# Patient Record
Sex: Female | Born: 1975 | Race: Black or African American | Hispanic: No | Marital: Single | Smoking: Never smoker
Health system: Southern US, Community
[De-identification: ages and names within clinical notes are randomized; demographics above are authoritative.]

## PROBLEM LIST (undated history)

## (undated) DIAGNOSIS — S335XXA Sprain of ligaments of lumbar spine, initial encounter: Secondary | ICD-10-CM

## (undated) DIAGNOSIS — D571 Sickle-cell disease without crisis: Secondary | ICD-10-CM

## (undated) DIAGNOSIS — E669 Obesity, unspecified: Secondary | ICD-10-CM

## (undated) DIAGNOSIS — B009 Herpesviral infection, unspecified: Secondary | ICD-10-CM

## (undated) DIAGNOSIS — A5601 Chlamydial cystitis and urethritis: Secondary | ICD-10-CM

## (undated) DIAGNOSIS — F329 Major depressive disorder, single episode, unspecified: Secondary | ICD-10-CM

## (undated) DIAGNOSIS — R358 Other polyuria: Secondary | ICD-10-CM

## (undated) DIAGNOSIS — I10 Essential (primary) hypertension: Secondary | ICD-10-CM

## (undated) DIAGNOSIS — R51 Headache: Secondary | ICD-10-CM

## (undated) HISTORY — PX: TONSILLECTOMY AND ADENOIDECTOMY: SUR1326

## (undated) HISTORY — DX: Herpesviral infection, unspecified: B00.9

## (undated) HISTORY — PX: TUBAL LIGATION: SHX77

## (undated) HISTORY — DX: Chlamydial cystitis and urethritis: A56.01

## (undated) HISTORY — DX: Major depressive disorder, single episode, unspecified: F32.9

## (undated) HISTORY — DX: Essential (primary) hypertension: I10

## (undated) HISTORY — DX: Sprain of ligaments of lumbar spine, initial encounter: S33.5XXA

## (undated) HISTORY — DX: Sickle-cell disease without crisis: D57.1

## (undated) HISTORY — DX: Other polyuria: R35.8

## (undated) HISTORY — DX: Headache: R51

## (undated) HISTORY — DX: Obesity, unspecified: E66.9

---

## 1998-04-10 ENCOUNTER — Emergency Department (HOSPITAL_COMMUNITY): Admission: EM | Admit: 1998-04-10 | Discharge: 1998-04-10 | Payer: Self-pay | Admitting: Emergency Medicine

## 1998-04-10 ENCOUNTER — Encounter: Payer: Self-pay | Admitting: Emergency Medicine

## 1998-07-23 ENCOUNTER — Emergency Department (HOSPITAL_COMMUNITY): Admission: EM | Admit: 1998-07-23 | Discharge: 1998-07-23 | Payer: Self-pay | Admitting: Emergency Medicine

## 1998-07-25 ENCOUNTER — Emergency Department (HOSPITAL_COMMUNITY): Admission: EM | Admit: 1998-07-25 | Discharge: 1998-07-25 | Payer: Self-pay | Admitting: Emergency Medicine

## 1998-07-31 ENCOUNTER — Ambulatory Visit (HOSPITAL_COMMUNITY): Admission: RE | Admit: 1998-07-31 | Discharge: 1998-07-31 | Payer: Self-pay | Admitting: Pulmonary Disease

## 1998-07-31 ENCOUNTER — Encounter: Payer: Self-pay | Admitting: Pulmonary Disease

## 1998-10-31 ENCOUNTER — Emergency Department (HOSPITAL_COMMUNITY): Admission: EM | Admit: 1998-10-31 | Discharge: 1998-10-31 | Payer: Self-pay | Admitting: Emergency Medicine

## 1998-11-01 ENCOUNTER — Emergency Department (HOSPITAL_COMMUNITY): Admission: EM | Admit: 1998-11-01 | Discharge: 1998-11-01 | Payer: Self-pay | Admitting: Emergency Medicine

## 1998-11-02 ENCOUNTER — Emergency Department (HOSPITAL_COMMUNITY): Admission: EM | Admit: 1998-11-02 | Discharge: 1998-11-02 | Payer: Self-pay | Admitting: Emergency Medicine

## 1998-12-10 ENCOUNTER — Emergency Department (HOSPITAL_COMMUNITY): Admission: EM | Admit: 1998-12-10 | Discharge: 1998-12-10 | Payer: Self-pay | Admitting: Emergency Medicine

## 1998-12-10 ENCOUNTER — Encounter: Payer: Self-pay | Admitting: Emergency Medicine

## 1998-12-12 ENCOUNTER — Emergency Department (HOSPITAL_COMMUNITY): Admission: EM | Admit: 1998-12-12 | Discharge: 1998-12-12 | Payer: Self-pay | Admitting: Emergency Medicine

## 1999-02-05 ENCOUNTER — Emergency Department (HOSPITAL_COMMUNITY): Admission: EM | Admit: 1999-02-05 | Discharge: 1999-02-05 | Payer: Self-pay | Admitting: Emergency Medicine

## 1999-02-05 ENCOUNTER — Encounter: Payer: Self-pay | Admitting: Emergency Medicine

## 1999-06-12 ENCOUNTER — Emergency Department (HOSPITAL_COMMUNITY): Admission: EM | Admit: 1999-06-12 | Discharge: 1999-06-12 | Payer: Self-pay | Admitting: Emergency Medicine

## 1999-07-30 ENCOUNTER — Encounter: Payer: Self-pay | Admitting: Emergency Medicine

## 1999-07-30 ENCOUNTER — Emergency Department (HOSPITAL_COMMUNITY): Admission: EM | Admit: 1999-07-30 | Discharge: 1999-07-30 | Payer: Self-pay | Admitting: Emergency Medicine

## 1999-08-22 ENCOUNTER — Emergency Department (HOSPITAL_COMMUNITY): Admission: EM | Admit: 1999-08-22 | Discharge: 1999-08-22 | Payer: Self-pay | Admitting: Emergency Medicine

## 2000-01-05 ENCOUNTER — Emergency Department (HOSPITAL_COMMUNITY): Admission: EM | Admit: 2000-01-05 | Discharge: 2000-01-05 | Payer: Self-pay | Admitting: Emergency Medicine

## 2000-04-15 ENCOUNTER — Emergency Department (HOSPITAL_COMMUNITY): Admission: EM | Admit: 2000-04-15 | Discharge: 2000-04-15 | Payer: Self-pay | Admitting: Emergency Medicine

## 2000-06-18 ENCOUNTER — Emergency Department (HOSPITAL_COMMUNITY): Admission: EM | Admit: 2000-06-18 | Discharge: 2000-06-18 | Payer: Self-pay | Admitting: Emergency Medicine

## 2000-06-19 ENCOUNTER — Encounter: Payer: Self-pay | Admitting: Emergency Medicine

## 2000-06-19 ENCOUNTER — Emergency Department (HOSPITAL_COMMUNITY): Admission: EM | Admit: 2000-06-19 | Discharge: 2000-06-20 | Payer: Self-pay

## 2000-07-20 ENCOUNTER — Emergency Department (HOSPITAL_COMMUNITY): Admission: EM | Admit: 2000-07-20 | Discharge: 2000-07-20 | Payer: Self-pay

## 2000-12-03 ENCOUNTER — Emergency Department (HOSPITAL_COMMUNITY): Admission: EM | Admit: 2000-12-03 | Discharge: 2000-12-03 | Payer: Self-pay | Admitting: Emergency Medicine

## 2001-01-02 ENCOUNTER — Encounter: Admission: RE | Admit: 2001-01-02 | Discharge: 2001-01-02 | Payer: Self-pay | Admitting: Internal Medicine

## 2001-06-17 ENCOUNTER — Emergency Department (HOSPITAL_COMMUNITY): Admission: EM | Admit: 2001-06-17 | Discharge: 2001-06-17 | Payer: Self-pay | Admitting: Emergency Medicine

## 2001-06-17 ENCOUNTER — Encounter: Payer: Self-pay | Admitting: Emergency Medicine

## 2001-06-23 ENCOUNTER — Encounter: Admission: RE | Admit: 2001-06-23 | Discharge: 2001-06-23 | Payer: Self-pay | Admitting: Internal Medicine

## 2001-08-17 ENCOUNTER — Emergency Department (HOSPITAL_COMMUNITY): Admission: EM | Admit: 2001-08-17 | Discharge: 2001-08-17 | Payer: Self-pay | Admitting: Emergency Medicine

## 2001-08-20 ENCOUNTER — Encounter: Payer: Self-pay | Admitting: Emergency Medicine

## 2001-08-20 ENCOUNTER — Emergency Department (HOSPITAL_COMMUNITY): Admission: EM | Admit: 2001-08-20 | Discharge: 2001-08-20 | Payer: Self-pay | Admitting: Emergency Medicine

## 2001-08-21 ENCOUNTER — Emergency Department (HOSPITAL_COMMUNITY): Admission: EM | Admit: 2001-08-21 | Discharge: 2001-08-21 | Payer: Self-pay | Admitting: Emergency Medicine

## 2001-08-21 ENCOUNTER — Encounter: Payer: Self-pay | Admitting: Emergency Medicine

## 2001-08-26 ENCOUNTER — Encounter: Admission: RE | Admit: 2001-08-26 | Discharge: 2001-08-26 | Payer: Self-pay

## 2001-08-26 ENCOUNTER — Ambulatory Visit (HOSPITAL_COMMUNITY): Admission: RE | Admit: 2001-08-26 | Discharge: 2001-08-26 | Payer: Self-pay

## 2001-08-26 ENCOUNTER — Encounter: Payer: Self-pay | Admitting: Internal Medicine

## 2001-09-02 ENCOUNTER — Encounter: Admission: RE | Admit: 2001-09-02 | Discharge: 2001-09-02 | Payer: Self-pay | Admitting: Internal Medicine

## 2001-09-16 ENCOUNTER — Encounter: Admission: RE | Admit: 2001-09-16 | Discharge: 2001-09-16 | Payer: Self-pay

## 2001-12-01 ENCOUNTER — Emergency Department (HOSPITAL_COMMUNITY): Admission: EM | Admit: 2001-12-01 | Discharge: 2001-12-01 | Payer: Self-pay | Admitting: *Deleted

## 2002-01-19 ENCOUNTER — Emergency Department (HOSPITAL_COMMUNITY): Admission: EM | Admit: 2002-01-19 | Discharge: 2002-01-19 | Payer: Self-pay | Admitting: Emergency Medicine

## 2002-02-10 ENCOUNTER — Emergency Department (HOSPITAL_COMMUNITY): Admission: EM | Admit: 2002-02-10 | Discharge: 2002-02-10 | Payer: Self-pay

## 2002-07-14 ENCOUNTER — Inpatient Hospital Stay (HOSPITAL_COMMUNITY): Admission: AD | Admit: 2002-07-14 | Discharge: 2002-07-14 | Payer: Self-pay | Admitting: Obstetrics and Gynecology

## 2002-07-15 ENCOUNTER — Inpatient Hospital Stay (HOSPITAL_COMMUNITY): Admission: AD | Admit: 2002-07-15 | Discharge: 2002-07-15 | Payer: Self-pay | Admitting: Internal Medicine

## 2002-07-16 ENCOUNTER — Inpatient Hospital Stay (HOSPITAL_COMMUNITY): Admission: AD | Admit: 2002-07-16 | Discharge: 2002-07-16 | Payer: Self-pay | Admitting: *Deleted

## 2002-07-27 ENCOUNTER — Encounter: Admission: RE | Admit: 2002-07-27 | Discharge: 2002-07-27 | Payer: Self-pay | Admitting: Obstetrics and Gynecology

## 2002-10-08 ENCOUNTER — Emergency Department (HOSPITAL_COMMUNITY): Admission: EM | Admit: 2002-10-08 | Discharge: 2002-10-08 | Payer: Self-pay | Admitting: Emergency Medicine

## 2002-11-25 ENCOUNTER — Inpatient Hospital Stay (HOSPITAL_COMMUNITY): Admission: AD | Admit: 2002-11-25 | Discharge: 2002-11-25 | Payer: Self-pay | Admitting: *Deleted

## 2002-11-25 ENCOUNTER — Encounter: Payer: Self-pay | Admitting: *Deleted

## 2002-12-16 ENCOUNTER — Encounter: Payer: Self-pay | Admitting: *Deleted

## 2002-12-16 ENCOUNTER — Ambulatory Visit (HOSPITAL_COMMUNITY): Admission: RE | Admit: 2002-12-16 | Discharge: 2002-12-16 | Payer: Self-pay | Admitting: *Deleted

## 2003-01-04 ENCOUNTER — Inpatient Hospital Stay: Admission: AD | Admit: 2003-01-04 | Discharge: 2003-01-04 | Payer: Self-pay | Admitting: *Deleted

## 2003-01-14 ENCOUNTER — Ambulatory Visit (HOSPITAL_COMMUNITY): Admission: RE | Admit: 2003-01-14 | Discharge: 2003-01-14 | Payer: Self-pay | Admitting: *Deleted

## 2003-01-20 ENCOUNTER — Encounter: Admission: RE | Admit: 2003-01-20 | Discharge: 2003-01-20 | Payer: Self-pay | Admitting: Family Medicine

## 2003-01-27 ENCOUNTER — Encounter: Admission: RE | Admit: 2003-01-27 | Discharge: 2003-01-27 | Payer: Self-pay | Admitting: Family Medicine

## 2003-02-03 ENCOUNTER — Encounter: Admission: RE | Admit: 2003-02-03 | Discharge: 2003-02-03 | Payer: Self-pay | Admitting: *Deleted

## 2003-02-17 ENCOUNTER — Encounter: Admission: RE | Admit: 2003-02-17 | Discharge: 2003-02-17 | Payer: Self-pay | Admitting: *Deleted

## 2003-02-21 ENCOUNTER — Inpatient Hospital Stay (HOSPITAL_COMMUNITY): Admission: AD | Admit: 2003-02-21 | Discharge: 2003-02-21 | Payer: Self-pay | Admitting: Family Medicine

## 2003-03-03 ENCOUNTER — Encounter: Admission: RE | Admit: 2003-03-03 | Discharge: 2003-03-03 | Payer: Self-pay | Admitting: *Deleted

## 2003-03-17 ENCOUNTER — Encounter: Admission: RE | Admit: 2003-03-17 | Discharge: 2003-03-17 | Payer: Self-pay | Admitting: *Deleted

## 2003-03-30 ENCOUNTER — Inpatient Hospital Stay (HOSPITAL_COMMUNITY): Admission: AD | Admit: 2003-03-30 | Discharge: 2003-03-30 | Payer: Self-pay | Admitting: Obstetrics & Gynecology

## 2003-03-31 ENCOUNTER — Ambulatory Visit (HOSPITAL_COMMUNITY): Admission: RE | Admit: 2003-03-31 | Discharge: 2003-03-31 | Payer: Self-pay | Admitting: Family Medicine

## 2003-03-31 ENCOUNTER — Encounter: Admission: RE | Admit: 2003-03-31 | Discharge: 2003-03-31 | Payer: Self-pay | Admitting: *Deleted

## 2003-04-05 ENCOUNTER — Encounter: Admission: RE | Admit: 2003-04-05 | Discharge: 2003-07-04 | Payer: Self-pay | Admitting: Obstetrics and Gynecology

## 2003-04-07 ENCOUNTER — Encounter: Admission: RE | Admit: 2003-04-07 | Discharge: 2003-04-07 | Payer: Self-pay | Admitting: *Deleted

## 2003-04-13 ENCOUNTER — Encounter: Admission: RE | Admit: 2003-04-13 | Discharge: 2003-04-13 | Payer: Self-pay | Admitting: *Deleted

## 2003-04-18 ENCOUNTER — Inpatient Hospital Stay (HOSPITAL_COMMUNITY): Admission: AD | Admit: 2003-04-18 | Discharge: 2003-04-18 | Payer: Self-pay | Admitting: Obstetrics and Gynecology

## 2003-04-21 ENCOUNTER — Inpatient Hospital Stay (HOSPITAL_COMMUNITY): Admission: AD | Admit: 2003-04-21 | Discharge: 2003-04-21 | Payer: Self-pay | Admitting: Obstetrics & Gynecology

## 2003-04-21 ENCOUNTER — Encounter: Admission: RE | Admit: 2003-04-21 | Discharge: 2003-04-21 | Payer: Self-pay | Admitting: *Deleted

## 2003-04-28 ENCOUNTER — Encounter: Admission: RE | Admit: 2003-04-28 | Discharge: 2003-04-28 | Payer: Self-pay | Admitting: *Deleted

## 2003-05-01 ENCOUNTER — Inpatient Hospital Stay (HOSPITAL_COMMUNITY): Admission: AD | Admit: 2003-05-01 | Discharge: 2003-05-01 | Payer: Self-pay | Admitting: Family Medicine

## 2003-05-05 ENCOUNTER — Encounter: Admission: RE | Admit: 2003-05-05 | Discharge: 2003-05-05 | Payer: Self-pay | Admitting: *Deleted

## 2003-05-09 ENCOUNTER — Ambulatory Visit (HOSPITAL_COMMUNITY): Admission: RE | Admit: 2003-05-09 | Discharge: 2003-05-09 | Payer: Self-pay | Admitting: Obstetrics and Gynecology

## 2003-05-09 ENCOUNTER — Encounter: Admission: RE | Admit: 2003-05-09 | Discharge: 2003-05-09 | Payer: Self-pay | Admitting: *Deleted

## 2003-05-12 ENCOUNTER — Encounter: Admission: RE | Admit: 2003-05-12 | Discharge: 2003-05-12 | Payer: Self-pay | Admitting: *Deleted

## 2003-05-16 ENCOUNTER — Inpatient Hospital Stay (HOSPITAL_COMMUNITY): Admission: AD | Admit: 2003-05-16 | Discharge: 2003-05-16 | Payer: Self-pay | Admitting: Obstetrics & Gynecology

## 2003-05-16 ENCOUNTER — Encounter: Admission: RE | Admit: 2003-05-16 | Discharge: 2003-05-16 | Payer: Self-pay | Admitting: *Deleted

## 2003-05-19 ENCOUNTER — Encounter: Admission: RE | Admit: 2003-05-19 | Discharge: 2003-05-19 | Payer: Self-pay | Admitting: Family Medicine

## 2003-05-22 ENCOUNTER — Inpatient Hospital Stay (HOSPITAL_COMMUNITY): Admission: AD | Admit: 2003-05-22 | Discharge: 2003-05-22 | Payer: Self-pay | Admitting: Obstetrics and Gynecology

## 2003-05-23 ENCOUNTER — Encounter: Admission: RE | Admit: 2003-05-23 | Discharge: 2003-05-23 | Payer: Self-pay | Admitting: *Deleted

## 2003-05-24 ENCOUNTER — Inpatient Hospital Stay (HOSPITAL_COMMUNITY): Admission: AD | Admit: 2003-05-24 | Discharge: 2003-05-25 | Payer: Self-pay | Admitting: *Deleted

## 2003-05-26 ENCOUNTER — Encounter: Admission: RE | Admit: 2003-05-26 | Discharge: 2003-05-26 | Payer: Self-pay | Admitting: *Deleted

## 2003-05-30 ENCOUNTER — Encounter: Admission: RE | Admit: 2003-05-30 | Discharge: 2003-05-30 | Payer: Self-pay | Admitting: *Deleted

## 2003-06-02 ENCOUNTER — Encounter: Admission: RE | Admit: 2003-06-02 | Discharge: 2003-06-02 | Payer: Self-pay | Admitting: *Deleted

## 2003-06-06 ENCOUNTER — Encounter: Admission: RE | Admit: 2003-06-06 | Discharge: 2003-06-06 | Payer: Self-pay | Admitting: *Deleted

## 2003-06-07 ENCOUNTER — Encounter (INDEPENDENT_AMBULATORY_CARE_PROVIDER_SITE_OTHER): Payer: Self-pay | Admitting: Specialist

## 2003-06-07 ENCOUNTER — Other Ambulatory Visit: Payer: Self-pay | Admitting: Student

## 2003-06-07 ENCOUNTER — Inpatient Hospital Stay (HOSPITAL_COMMUNITY): Admission: AD | Admit: 2003-06-07 | Discharge: 2003-06-10 | Payer: Self-pay | Admitting: Obstetrics & Gynecology

## 2003-06-16 ENCOUNTER — Encounter: Admission: RE | Admit: 2003-06-16 | Discharge: 2003-06-16 | Payer: Self-pay | Admitting: *Deleted

## 2004-05-24 ENCOUNTER — Emergency Department (HOSPITAL_COMMUNITY): Admission: EM | Admit: 2004-05-24 | Discharge: 2004-05-24 | Payer: Self-pay | Admitting: Family Medicine

## 2005-01-09 ENCOUNTER — Emergency Department (HOSPITAL_COMMUNITY): Admission: EM | Admit: 2005-01-09 | Discharge: 2005-01-09 | Payer: Self-pay | Admitting: Emergency Medicine

## 2005-02-13 ENCOUNTER — Emergency Department (HOSPITAL_COMMUNITY): Admission: EM | Admit: 2005-02-13 | Discharge: 2005-02-13 | Payer: Self-pay | Admitting: Emergency Medicine

## 2005-07-10 ENCOUNTER — Emergency Department (HOSPITAL_COMMUNITY): Admission: EM | Admit: 2005-07-10 | Discharge: 2005-07-10 | Payer: Self-pay | Admitting: Emergency Medicine

## 2005-08-22 IMAGING — US US OB FOLLOW-UP
1 series · 13 of 28 positions shown · non-contrast
Comparison: none

CLINICAL DATA: Assess growth and amniotic fluid volume.  Patient has gestational diabetes and hypertension.

[Series 1: unknown · 0.37mm/px · 13 of 35 slices shown]
[im 2/35]
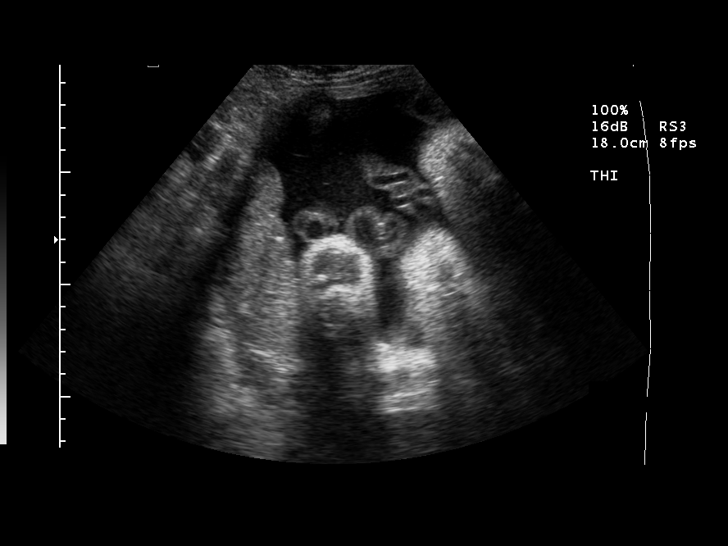
[im 4/35]
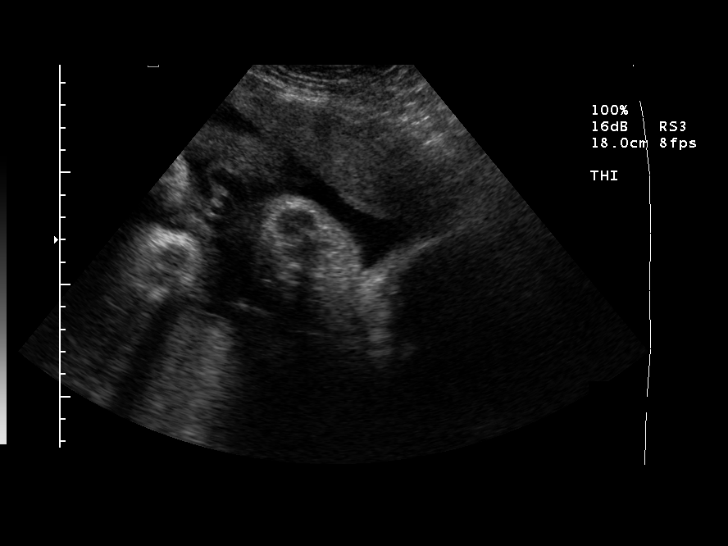
[im 7/35]
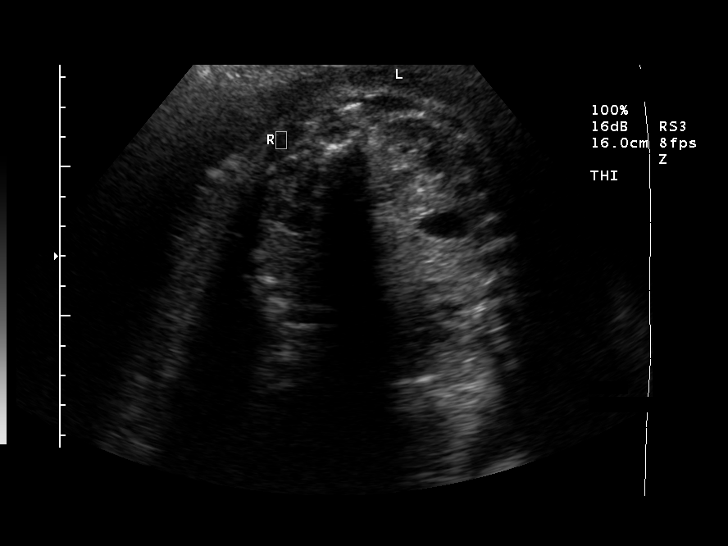
[im 9/35]
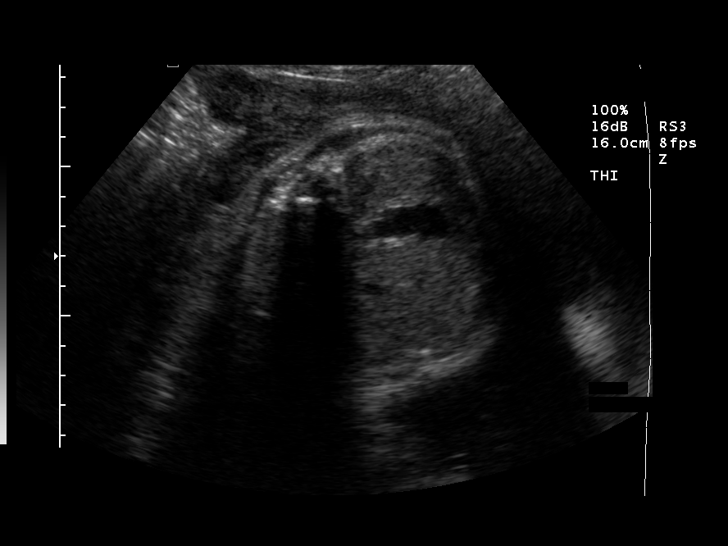
[im 12/35]
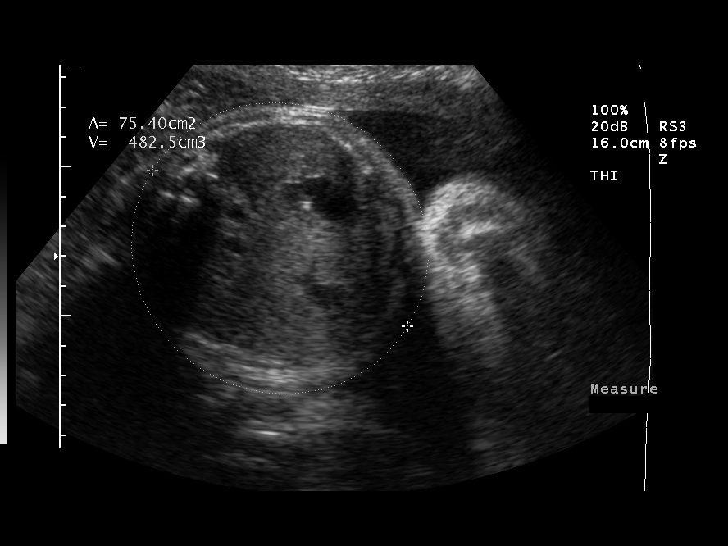
[im 14/35]
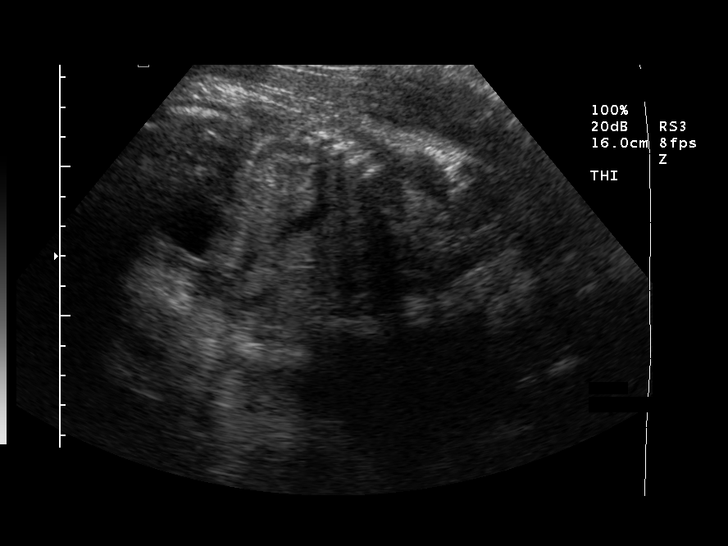
[im 18/35]
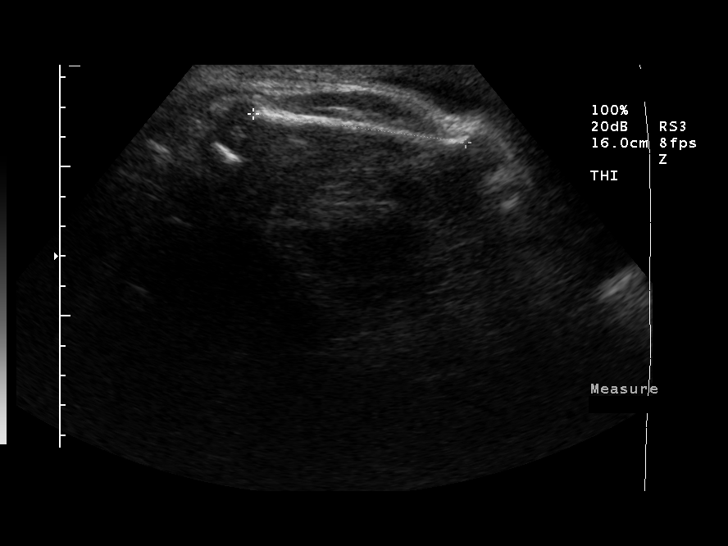
[im 21/35]
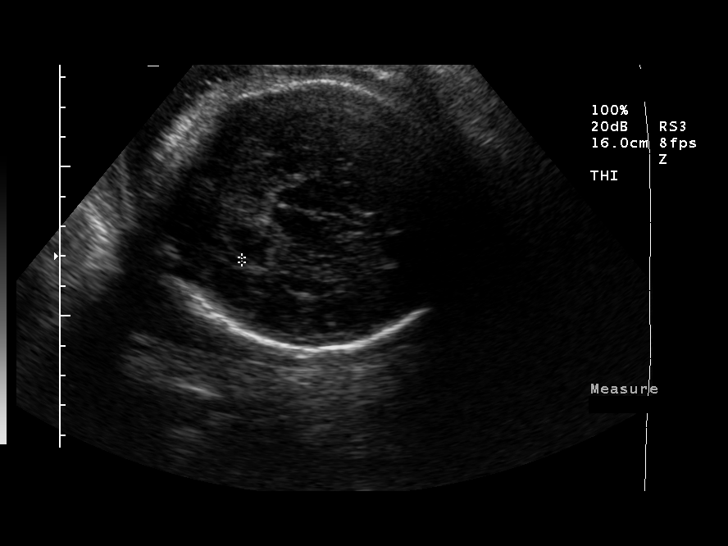
[im 23/35]
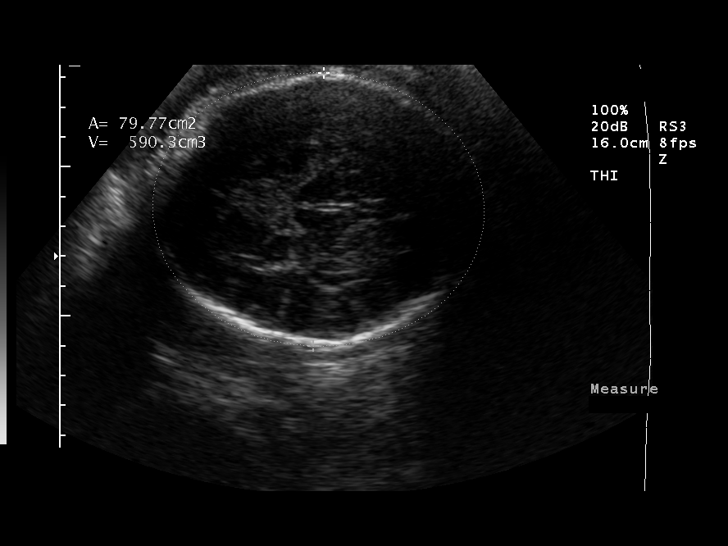
[im 26/35]
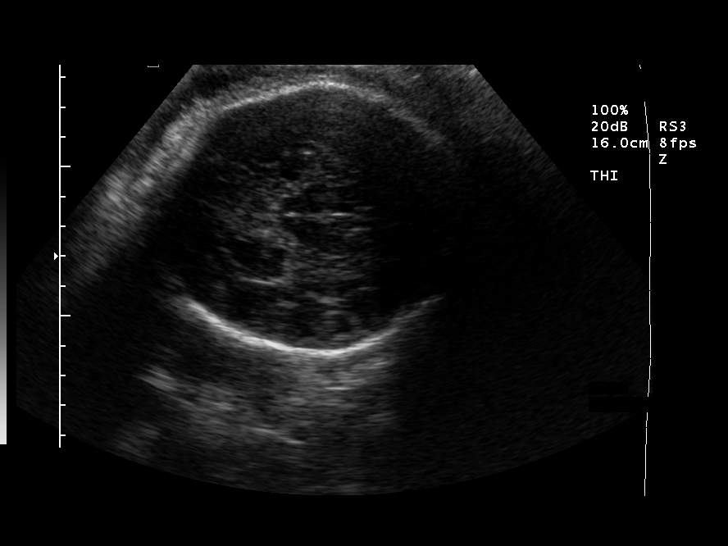
[im 28/35]
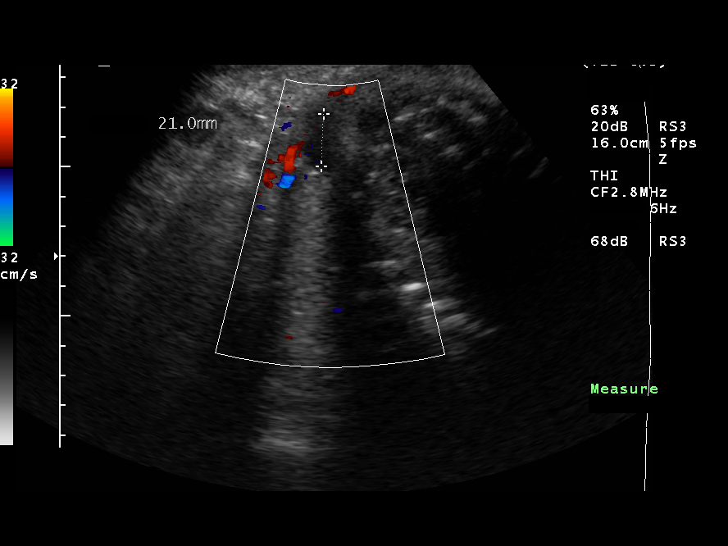
[im 31/35]
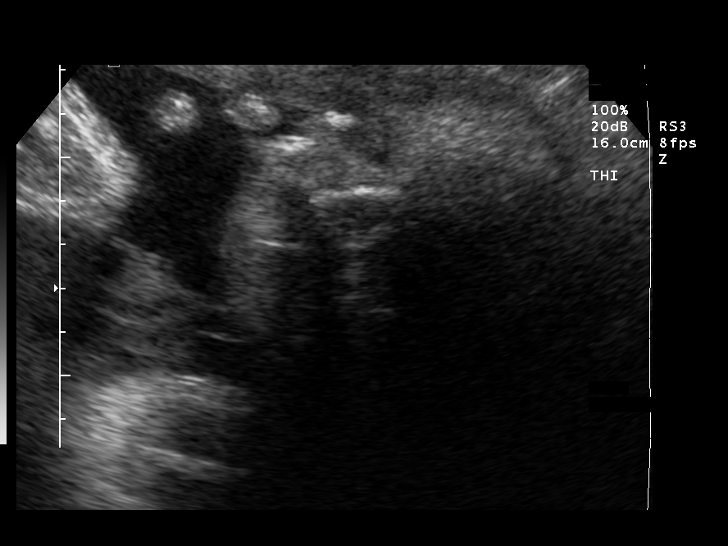
[im 33/35]
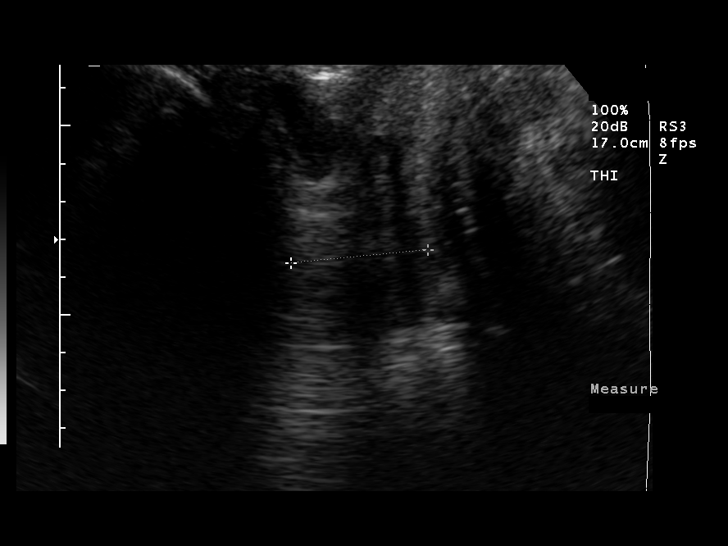

[13 of 28 positions shown; findings below may reference images not displayed]

OBSTETRICAL ULTRASOUND RE-EVALUATION

 NUMBER OF FETUSES: 1
 HEART RATE:  135
 MOVEMENT:  Yes
 BREATHING:  No
 PRESENTATION:  Cephalic
 PLACENTAL LOCATION:  Posterior
 GRADE:  I
 PREVIA:  No
 AMNIOTIC FLUID (subjective):  Normal
 AMNIOTIC FLUID (objective):  13.5 cm AFI (5th - 95th%ile =   7.9 – 24.9 cm for 35 wks)

 FETAL BIOMETRY
 BPD:  9.0 cm   36 w 3 d
 HC:  31.9 cm  35 w 6 d
 AC:  30.7 cm  34 w 5 d
 FL:  7.0 cm  36 w 0 d
 MEAN GA:  35 w 5 d
 ASSIGNED GA:  34 w 4 d (1st US)
 EFW:  9294 g (H) 50th – 75th%ile (9606 – 5811 g) For 35 weeks

 FETAL ANATOMY
 LATERAL VENTRICLES:  Visualized 
 THALAMI/CSP:  Visualized 
 POSTERIOR FOSSA:  Previously seen 
 NUCHAL REGION:  Previously seen 
 SPINE:  Previously seen 
 4 CHAMBER HEART ON LEFT:  Previously seen 
 STOMACH ON LEFT:  Visualized 
 3 VESSEL CORD:  Visualized 
 CORD INSERTION SITE:  Previously seen 
 KIDNEYS:  Visualized 
 BLADDER:  Visualized 
 EXTREMITIES:  Previously seen 

 MATERNAL FINDINGS
 CERVIX:  3.6 cm Translabially
IMPRESSION: Single living intrauterine fetus in cephalic presentation.  Patient is 34 weeks 4 days by first ultrasound and measures 35 weeks 5 days today with a fetal weight of 9294 grams, falling between the 50th and 75th percentile for 35 weeks.  Growth is appropriate.
 Normal amniotic fluid volume with AFI 13.5 cm.

## 2005-12-23 ENCOUNTER — Emergency Department (HOSPITAL_COMMUNITY): Admission: EM | Admit: 2005-12-23 | Discharge: 2005-12-23 | Payer: Self-pay | Admitting: Emergency Medicine

## 2005-12-31 ENCOUNTER — Emergency Department (HOSPITAL_COMMUNITY): Admission: EM | Admit: 2005-12-31 | Discharge: 2005-12-31 | Payer: Self-pay | Admitting: *Deleted

## 2006-07-07 ENCOUNTER — Emergency Department (HOSPITAL_COMMUNITY): Admission: EM | Admit: 2006-07-07 | Discharge: 2006-07-07 | Payer: Self-pay | Admitting: Family Medicine

## 2006-08-11 ENCOUNTER — Emergency Department (HOSPITAL_COMMUNITY): Admission: EM | Admit: 2006-08-11 | Discharge: 2006-08-11 | Payer: Self-pay | Admitting: Family Medicine

## 2006-08-14 ENCOUNTER — Emergency Department (HOSPITAL_COMMUNITY): Admission: EM | Admit: 2006-08-14 | Discharge: 2006-08-14 | Payer: Self-pay | Admitting: Family Medicine

## 2006-09-16 ENCOUNTER — Emergency Department (HOSPITAL_COMMUNITY): Admission: EM | Admit: 2006-09-16 | Discharge: 2006-09-16 | Payer: Self-pay | Admitting: Emergency Medicine

## 2006-12-16 ENCOUNTER — Emergency Department (HOSPITAL_COMMUNITY): Admission: EM | Admit: 2006-12-16 | Discharge: 2006-12-16 | Payer: Self-pay | Admitting: *Deleted

## 2007-02-17 ENCOUNTER — Emergency Department (HOSPITAL_COMMUNITY): Admission: EM | Admit: 2007-02-17 | Discharge: 2007-02-17 | Payer: Self-pay | Admitting: Emergency Medicine

## 2007-06-22 ENCOUNTER — Emergency Department (HOSPITAL_COMMUNITY): Admission: EM | Admit: 2007-06-22 | Discharge: 2007-06-22 | Payer: Self-pay | Admitting: Emergency Medicine

## 2007-08-11 ENCOUNTER — Emergency Department (HOSPITAL_COMMUNITY): Admission: EM | Admit: 2007-08-11 | Discharge: 2007-08-11 | Payer: Self-pay | Admitting: Emergency Medicine

## 2007-08-24 ENCOUNTER — Emergency Department (HOSPITAL_COMMUNITY): Admission: EM | Admit: 2007-08-24 | Discharge: 2007-08-24 | Payer: Self-pay | Admitting: Family Medicine

## 2007-11-25 ENCOUNTER — Emergency Department (HOSPITAL_COMMUNITY): Admission: EM | Admit: 2007-11-25 | Discharge: 2007-11-25 | Payer: Self-pay | Admitting: Emergency Medicine

## 2007-12-06 ENCOUNTER — Emergency Department (HOSPITAL_COMMUNITY): Admission: EM | Admit: 2007-12-06 | Discharge: 2007-12-06 | Payer: Self-pay | Admitting: Emergency Medicine

## 2008-02-15 ENCOUNTER — Emergency Department (HOSPITAL_COMMUNITY): Admission: EM | Admit: 2008-02-15 | Discharge: 2008-02-15 | Payer: Self-pay | Admitting: Family Medicine

## 2008-03-31 ENCOUNTER — Emergency Department (HOSPITAL_COMMUNITY): Admission: EM | Admit: 2008-03-31 | Discharge: 2008-03-31 | Payer: Self-pay | Admitting: Emergency Medicine

## 2008-04-22 ENCOUNTER — Emergency Department (HOSPITAL_COMMUNITY): Admission: EM | Admit: 2008-04-22 | Discharge: 2008-04-22 | Payer: Self-pay | Admitting: Family Medicine

## 2008-06-06 ENCOUNTER — Emergency Department (HOSPITAL_COMMUNITY): Admission: EM | Admit: 2008-06-06 | Discharge: 2008-06-07 | Payer: Self-pay | Admitting: Emergency Medicine

## 2008-08-16 ENCOUNTER — Emergency Department (HOSPITAL_COMMUNITY): Admission: EM | Admit: 2008-08-16 | Discharge: 2008-08-16 | Payer: Self-pay | Admitting: Family Medicine

## 2008-10-20 ENCOUNTER — Ambulatory Visit: Payer: Self-pay | Admitting: Family Medicine

## 2008-10-20 DIAGNOSIS — F3289 Other specified depressive episodes: Secondary | ICD-10-CM | POA: Insufficient documentation

## 2008-10-20 DIAGNOSIS — F329 Major depressive disorder, single episode, unspecified: Secondary | ICD-10-CM | POA: Insufficient documentation

## 2008-10-20 DIAGNOSIS — I10 Essential (primary) hypertension: Secondary | ICD-10-CM

## 2008-10-20 HISTORY — DX: Major depressive disorder, single episode, unspecified: F32.9

## 2008-10-20 HISTORY — DX: Essential (primary) hypertension: I10

## 2008-10-20 HISTORY — DX: Other specified depressive episodes: F32.89

## 2008-11-23 ENCOUNTER — Other Ambulatory Visit: Admission: RE | Admit: 2008-11-23 | Discharge: 2008-11-23 | Payer: Self-pay | Admitting: Family Medicine

## 2008-11-23 ENCOUNTER — Encounter: Payer: Self-pay | Admitting: Family Medicine

## 2008-11-23 ENCOUNTER — Ambulatory Visit: Payer: Self-pay | Admitting: Family Medicine

## 2008-11-23 DIAGNOSIS — R21 Rash and other nonspecific skin eruption: Secondary | ICD-10-CM

## 2008-11-23 LAB — CONVERTED CEMR LAB
Bilirubin Urine: NEGATIVE
Glucose, Urine, Semiquant: NEGATIVE
Ketones, urine, test strip: NEGATIVE
Nitrite: NEGATIVE
Specific Gravity, Urine: 1.02
Urobilinogen, UA: 0.2
pH: 6

## 2008-11-24 LAB — CONVERTED CEMR LAB
ALT: 13 units/L (ref 0–35)
AST: 21 units/L (ref 0–37)
Albumin: 4.1 g/dL (ref 3.5–5.2)
Alkaline Phosphatase: 56 units/L (ref 39–117)
BUN: 11 mg/dL (ref 6–23)
Basophils Absolute: 0 10*3/uL (ref 0.0–0.1)
Basophils Relative: 0.2 % (ref 0.0–3.0)
Bilirubin, Direct: 0 mg/dL (ref 0.0–0.3)
CO2: 32 meq/L (ref 19–32)
Calcium: 9.7 mg/dL (ref 8.4–10.5)
Chloride: 102 meq/L (ref 96–112)
Cholesterol: 192 mg/dL (ref 0–200)
Creatinine, Ser: 0.8 mg/dL (ref 0.4–1.2)
Eosinophils Absolute: 0.1 10*3/uL (ref 0.0–0.7)
Eosinophils Relative: 2.8 % (ref 0.0–5.0)
GFR calc non Af Amer: 107.18 mL/min (ref >60)
Glucose, Bld: 90 mg/dL (ref 70–99)
HCT: 36.6 % (ref 36.0–46.0)
HDL: 46.2 mg/dL (ref >39.00)
Hemoglobin: 12.6 g/dL (ref 12.0–15.0)
LDL Cholesterol: 132 mg/dL — ABNORMAL HIGH (ref 0–99)
Lymphocytes Relative: 37.5 % (ref 12.0–46.0)
Lymphs Abs: 1.8 10*3/uL (ref 0.7–4.0)
MCHC: 34.4 g/dL (ref 30.0–36.0)
MCV: 86 fL (ref 78.0–100.0)
Monocytes Absolute: 0.4 10*3/uL (ref 0.1–1.0)
Monocytes Relative: 8.3 % (ref 3.0–12.0)
Neutro Abs: 2.5 10*3/uL (ref 1.4–7.7)
Neutrophils Relative %: 51.2 % (ref 43.0–77.0)
Platelets: 331 10*3/uL (ref 150.0–400.0)
Potassium: 3.6 meq/L (ref 3.5–5.1)
RBC: 4.25 M/uL (ref 3.87–5.11)
RDW: 12.9 % (ref 11.5–14.6)
Sodium: 142 meq/L (ref 135–145)
TSH: 0.71 microintl units/mL (ref 0.35–5.50)
Total Bilirubin: 0.8 mg/dL (ref 0.3–1.2)
Total CHOL/HDL Ratio: 4
Total Protein: 7.9 g/dL (ref 6.0–8.3)
Triglycerides: 68 mg/dL (ref 0.0–149.0)
VLDL: 13.6 mg/dL (ref 0.0–40.0)
WBC: 4.8 10*3/uL (ref 4.5–10.5)

## 2008-11-24 IMAGING — CR DG HAND COMPLETE 3+V*R*
3 series · 3 of 3 positions shown · non-contrast
Comparison: none

CLINICAL DATA: Fall on outstretched hand yesterday.  
RIGHT HAND ? 3 VIEW:
  Pain and swelling to second MCP joint.       FINDINGS:  There is no evidence of bone, joint, or soft tissue abnormality.

[view not recorded (1 of 3)]
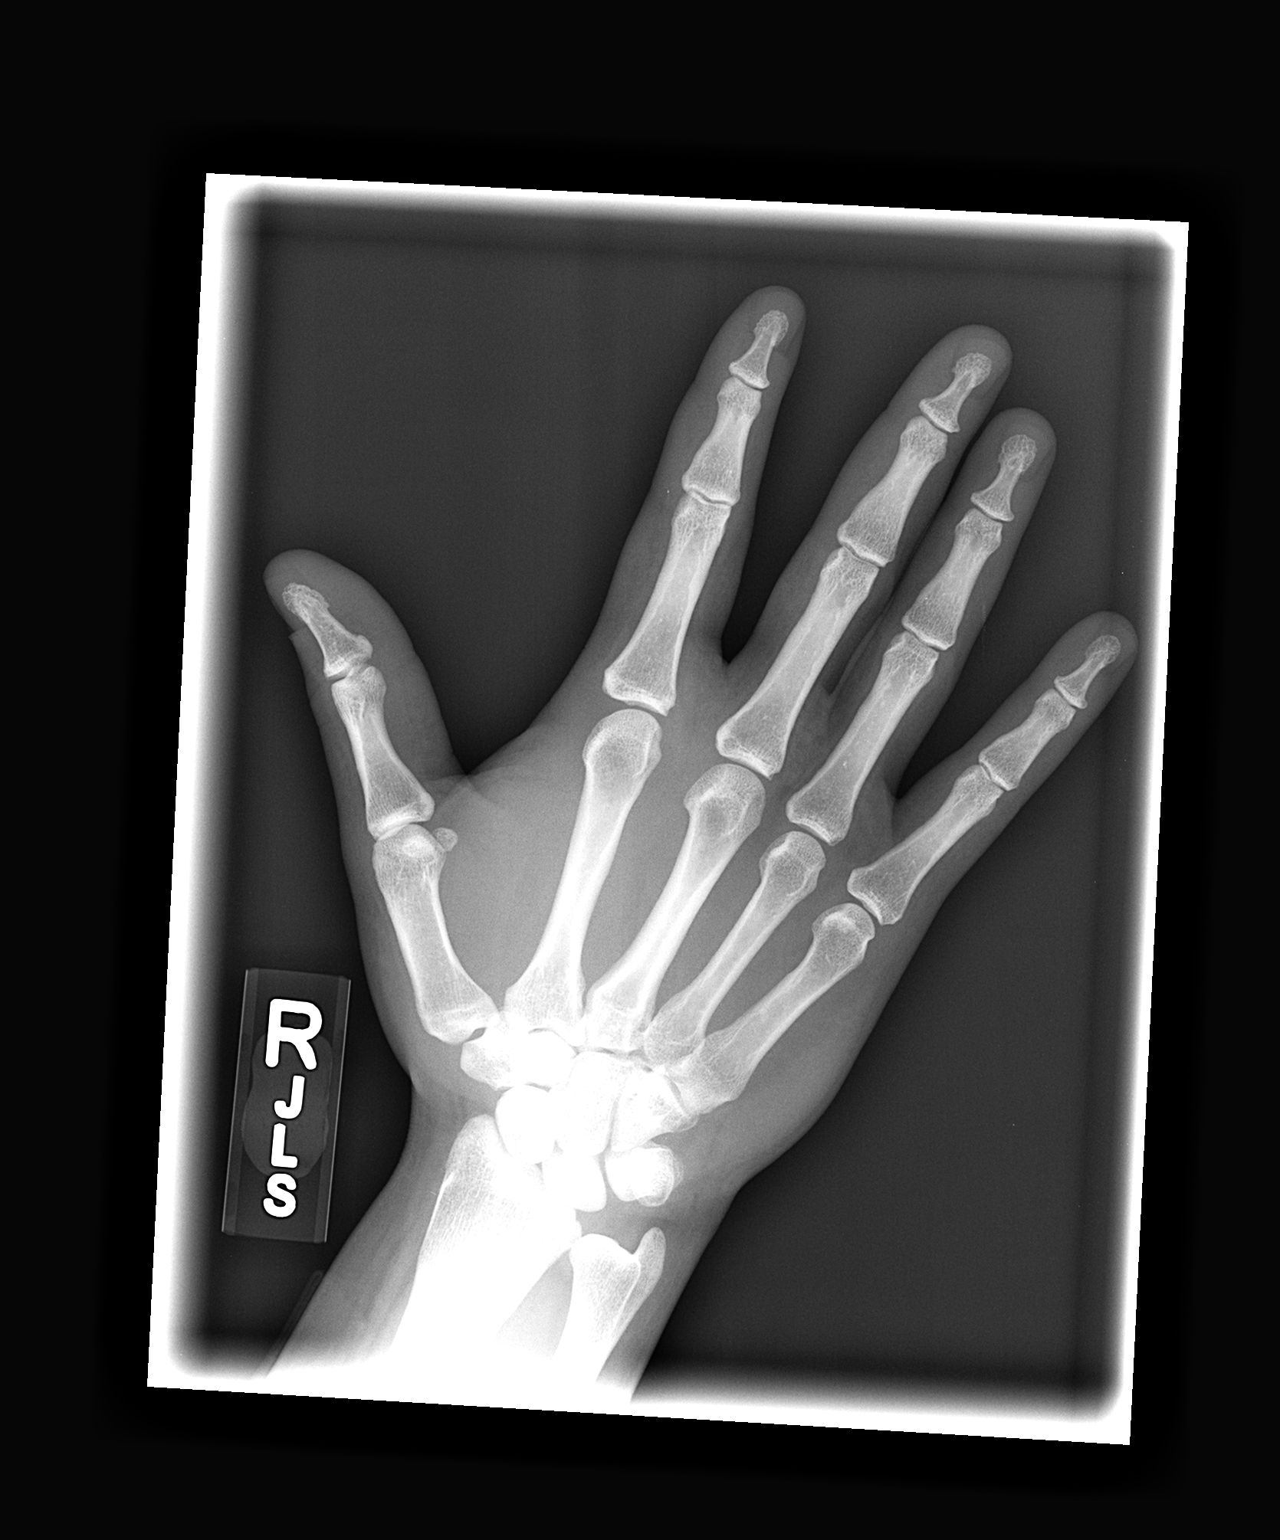

[view not recorded (2 of 3)]
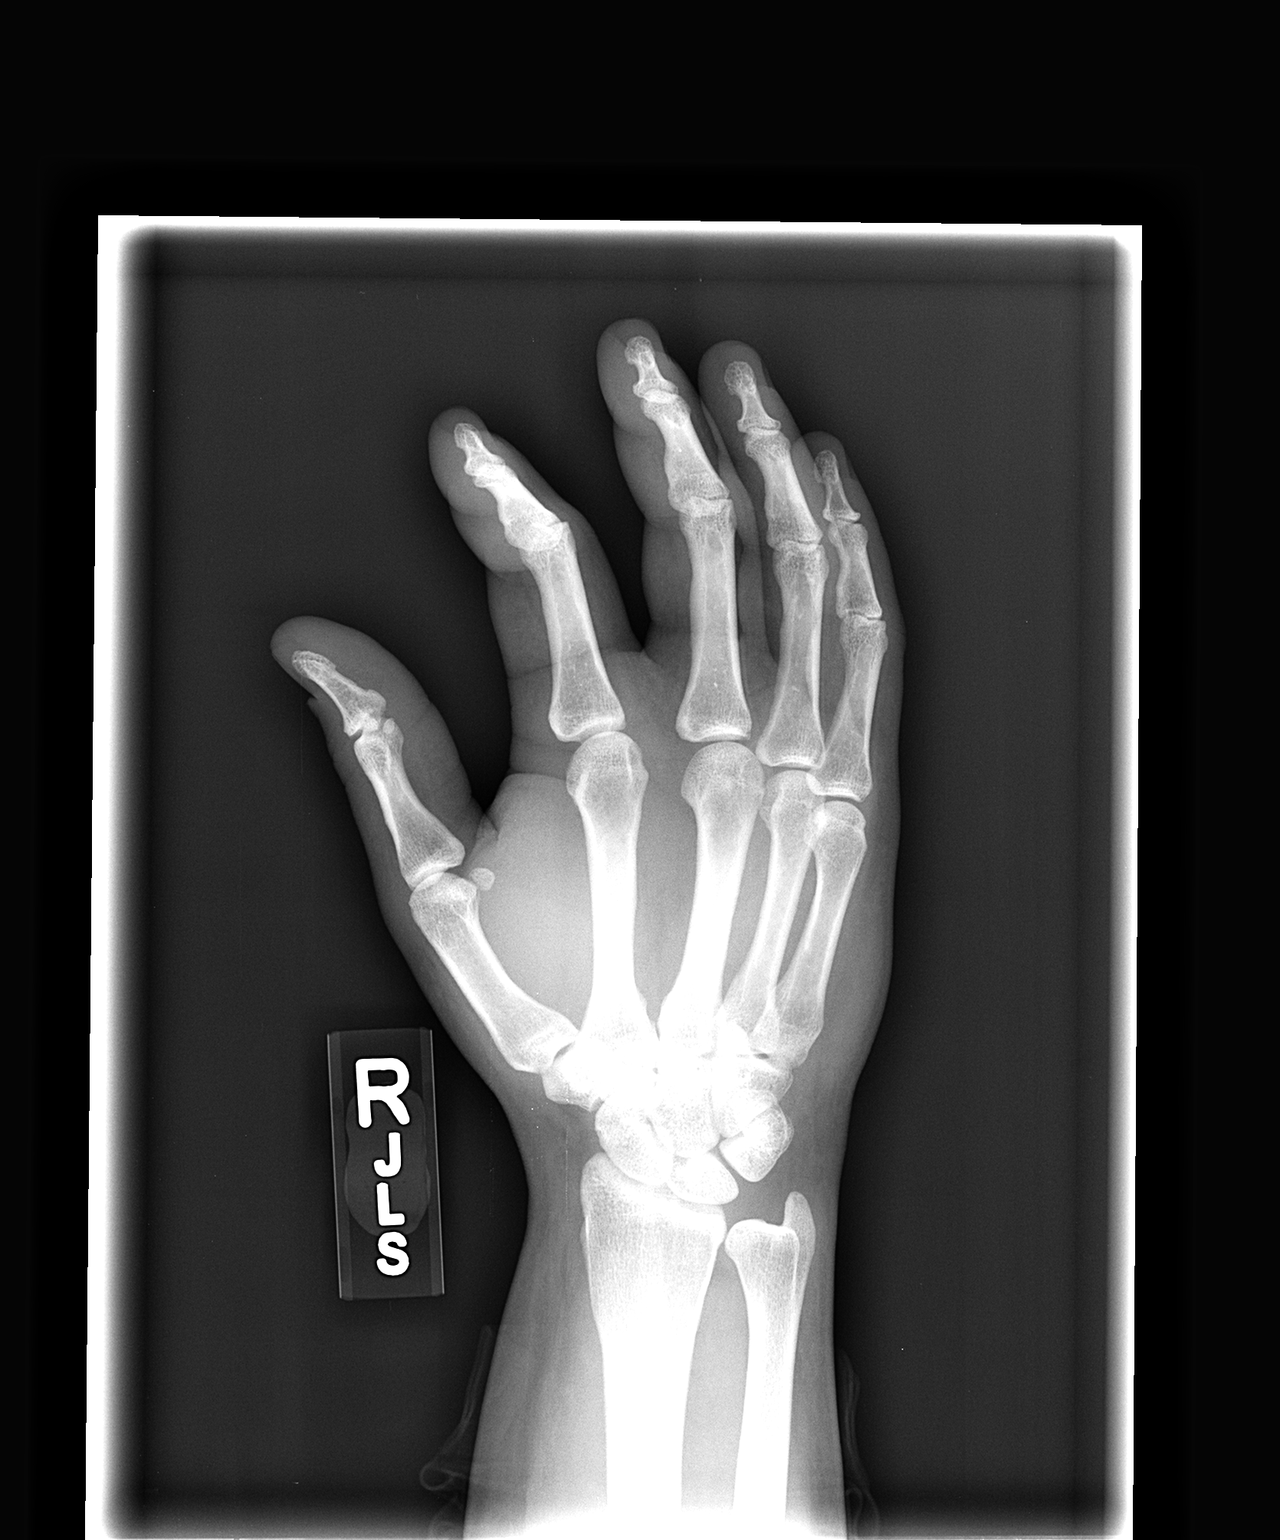

[view not recorded (3 of 3)]
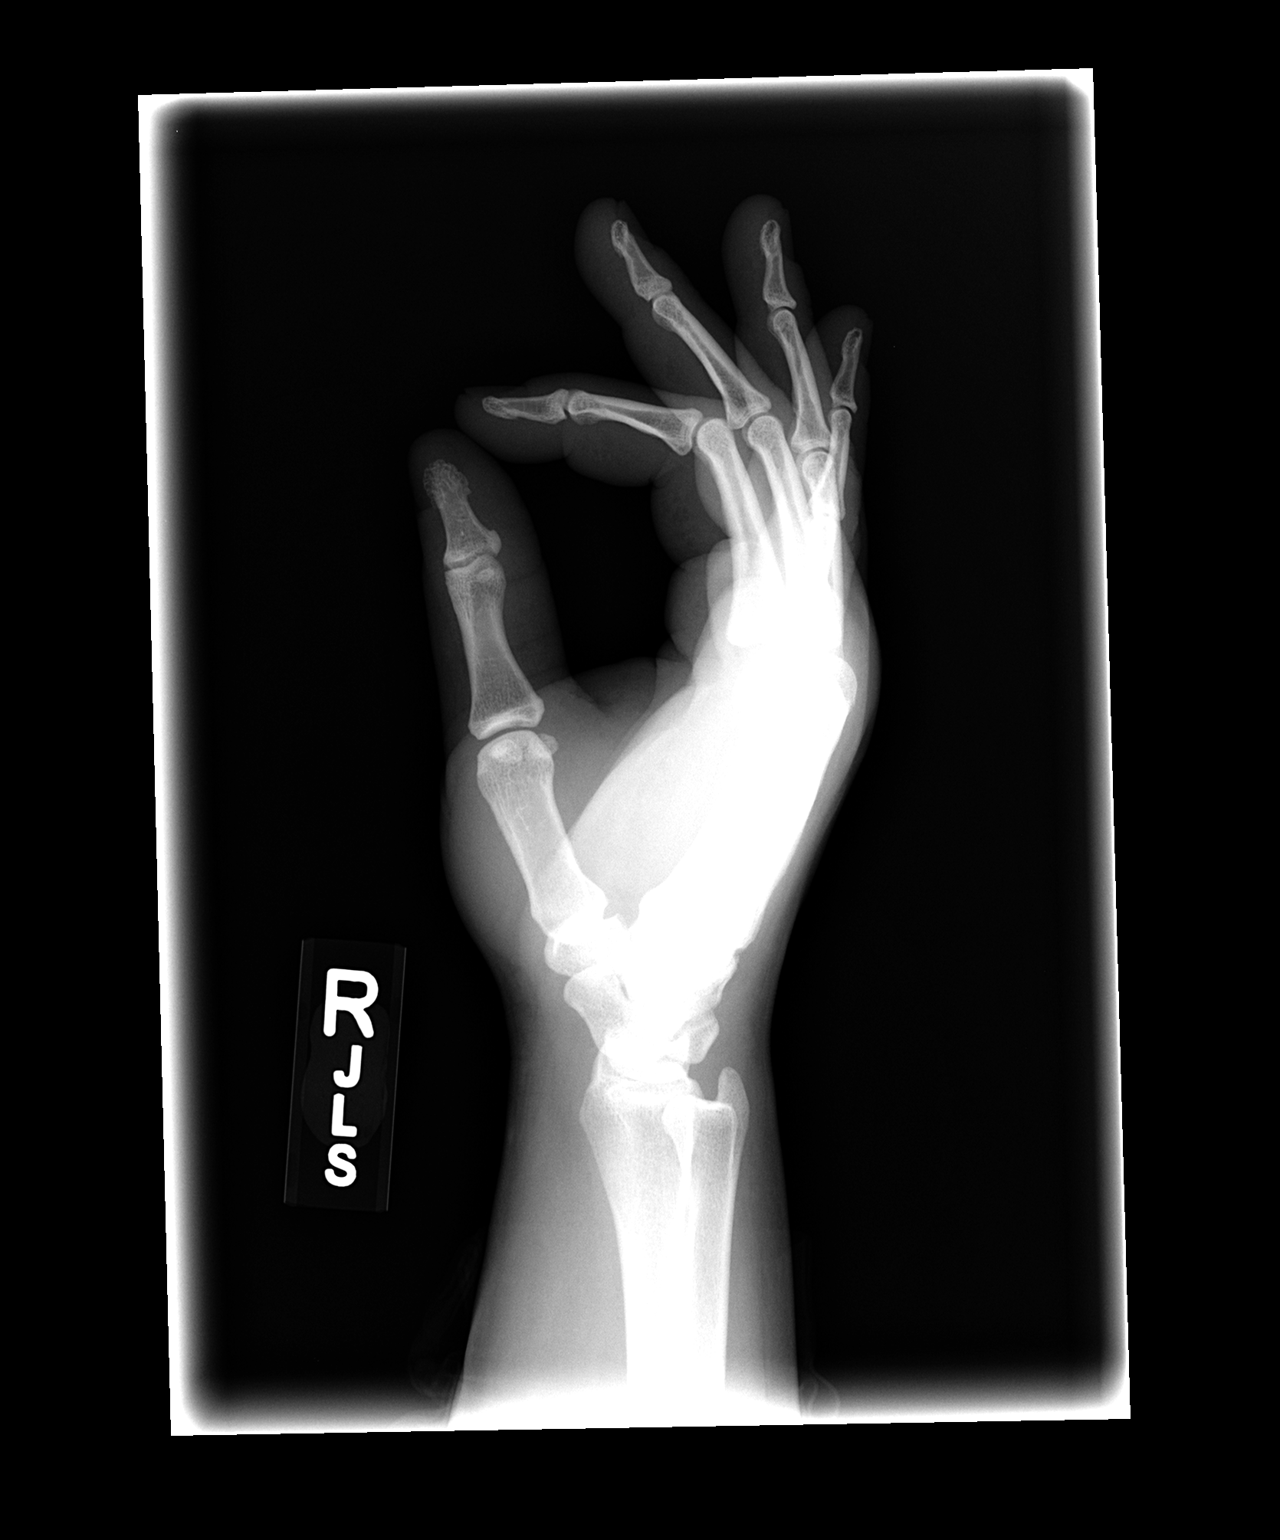

[3 of 3 positions shown; findings below may reference images not displayed]

IMPRESSION: Normal right hand.

## 2009-04-18 ENCOUNTER — Ambulatory Visit: Payer: Self-pay | Admitting: Family Medicine

## 2009-04-18 DIAGNOSIS — R519 Headache, unspecified: Secondary | ICD-10-CM | POA: Insufficient documentation

## 2009-04-18 DIAGNOSIS — R51 Headache: Secondary | ICD-10-CM

## 2009-06-21 ENCOUNTER — Ambulatory Visit: Payer: Self-pay | Admitting: Family Medicine

## 2009-07-24 ENCOUNTER — Ambulatory Visit: Payer: Self-pay | Admitting: Family Medicine

## 2009-08-07 ENCOUNTER — Telehealth: Payer: Self-pay | Admitting: Family Medicine

## 2009-08-17 ENCOUNTER — Ambulatory Visit: Payer: Self-pay | Admitting: Family Medicine

## 2009-08-17 DIAGNOSIS — S335XXA Sprain of ligaments of lumbar spine, initial encounter: Secondary | ICD-10-CM | POA: Insufficient documentation

## 2009-08-17 HISTORY — DX: Sprain of ligaments of lumbar spine, initial encounter: S33.5XXA

## 2009-09-01 ENCOUNTER — Telehealth: Payer: Self-pay | Admitting: Family Medicine

## 2009-11-29 ENCOUNTER — Ambulatory Visit: Payer: Self-pay | Admitting: Family Medicine

## 2009-11-29 DIAGNOSIS — A5601 Chlamydial cystitis and urethritis: Secondary | ICD-10-CM

## 2009-11-29 DIAGNOSIS — E669 Obesity, unspecified: Secondary | ICD-10-CM

## 2009-11-29 DIAGNOSIS — N390 Urinary tract infection, site not specified: Secondary | ICD-10-CM

## 2009-11-29 HISTORY — DX: Obesity, unspecified: E66.9

## 2009-11-29 HISTORY — DX: Chlamydial cystitis and urethritis: A56.01

## 2009-11-30 LAB — CONVERTED CEMR LAB
Chlamydia, Swab/Urine, PCR: POSITIVE — AB
GC Probe Amp, Urine: NEGATIVE

## 2009-12-28 ENCOUNTER — Telehealth: Payer: Self-pay | Admitting: Family Medicine

## 2010-02-15 ENCOUNTER — Ambulatory Visit: Payer: Self-pay | Admitting: Family Medicine

## 2010-02-15 ENCOUNTER — Encounter: Payer: Self-pay | Admitting: Family Medicine

## 2010-02-15 ENCOUNTER — Telehealth: Payer: Self-pay | Admitting: *Deleted

## 2010-02-15 DIAGNOSIS — R3589 Other polyuria: Secondary | ICD-10-CM

## 2010-02-15 DIAGNOSIS — R358 Other polyuria: Secondary | ICD-10-CM

## 2010-02-15 HISTORY — DX: Other polyuria: R35.89

## 2010-02-15 LAB — CONVERTED CEMR LAB
Bilirubin Urine: NEGATIVE
Blood Glucose, AC Bkfst: 100 mg/dL
Glucose, Urine, Semiquant: NEGATIVE
Ketones, urine, test strip: NEGATIVE
Nitrite: NEGATIVE
Protein, U semiquant: NEGATIVE
Specific Gravity, Urine: 1.02
Urobilinogen, UA: 0.2
pH: 6

## 2010-02-19 ENCOUNTER — Telehealth (INDEPENDENT_AMBULATORY_CARE_PROVIDER_SITE_OTHER): Payer: Self-pay | Admitting: *Deleted

## 2010-02-19 LAB — CONVERTED CEMR LAB
Chlamydia, Swab/Urine, PCR: NEGATIVE
GC Probe Amp, Urine: NEGATIVE

## 2010-06-12 ENCOUNTER — Ambulatory Visit
Admission: RE | Admit: 2010-06-12 | Discharge: 2010-06-12 | Payer: Self-pay | Source: Home / Self Care | Attending: Family Medicine | Admitting: Family Medicine

## 2010-06-12 ENCOUNTER — Other Ambulatory Visit: Payer: Self-pay | Admitting: Family Medicine

## 2010-06-12 LAB — LIPID PANEL
Cholesterol: 144 mg/dL (ref 0–200)
HDL: 36.4 mg/dL — ABNORMAL LOW (ref >39.00)
LDL Cholesterol: 100 mg/dL — ABNORMAL HIGH (ref 0–99)
Total CHOL/HDL Ratio: 4
Triglycerides: 38 mg/dL (ref 0.0–149.0)
VLDL: 7.6 mg/dL (ref 0.0–40.0)

## 2010-06-12 LAB — CONVERTED CEMR LAB
Bilirubin Urine: NEGATIVE
Blood in Urine, dipstick: 3
Glucose, Urine, Semiquant: NEGATIVE
Ketones, urine, test strip: NEGATIVE
Nitrite: NEGATIVE
Protein, U semiquant: NEGATIVE
Specific Gravity, Urine: 1.02
Urobilinogen, UA: 0.2
pH: 5.5

## 2010-06-12 LAB — CBC WITH DIFFERENTIAL/PLATELET
Basophils Absolute: 0 10*3/uL (ref 0.0–0.1)
Basophils Relative: 0.5 % (ref 0.0–3.0)
Eosinophils Absolute: 0.1 10*3/uL (ref 0.0–0.7)
Eosinophils Relative: 2.4 % (ref 0.0–5.0)
HCT: 33.2 % — ABNORMAL LOW (ref 36.0–46.0)
Hemoglobin: 11.1 g/dL — ABNORMAL LOW (ref 12.0–15.0)
Lymphocytes Relative: 46 % (ref 12.0–46.0)
Lymphs Abs: 2.4 10*3/uL (ref 0.7–4.0)
MCHC: 33.5 g/dL (ref 30.0–36.0)
MCV: 85.6 fl (ref 78.0–100.0)
Monocytes Absolute: 0.3 10*3/uL (ref 0.1–1.0)
Monocytes Relative: 6.2 % (ref 3.0–12.0)
Neutro Abs: 2.3 10*3/uL (ref 1.4–7.7)
Neutrophils Relative %: 44.9 % (ref 43.0–77.0)
Platelets: 328 10*3/uL (ref 150.0–400.0)
RBC: 3.88 Mil/uL (ref 3.87–5.11)
RDW: 14.9 % — ABNORMAL HIGH (ref 11.5–14.6)
WBC: 5.2 10*3/uL (ref 4.5–10.5)

## 2010-06-12 LAB — HEPATIC FUNCTION PANEL
ALT: 9 U/L (ref 0–35)
AST: 15 U/L (ref 0–37)
Albumin: 3.7 g/dL (ref 3.5–5.2)
Alkaline Phosphatase: 55 U/L (ref 39–117)
Bilirubin, Direct: 0.1 mg/dL (ref 0.0–0.3)
Total Bilirubin: 0.5 mg/dL (ref 0.3–1.2)
Total Protein: 6.6 g/dL (ref 6.0–8.3)

## 2010-06-12 LAB — BASIC METABOLIC PANEL
BUN: 10 mg/dL (ref 6–23)
CO2: 27 mEq/L (ref 19–32)
Calcium: 9 mg/dL (ref 8.4–10.5)
Chloride: 106 mEq/L (ref 96–112)
Creatinine, Ser: 0.7 mg/dL (ref 0.4–1.2)
GFR: 123.82 mL/min (ref >60.00)
Glucose, Bld: 84 mg/dL (ref 70–99)
Potassium: 4 mEq/L (ref 3.5–5.1)
Sodium: 140 mEq/L (ref 135–145)

## 2010-06-12 LAB — TSH: TSH: 0.72 u[IU]/mL (ref 0.35–5.50)

## 2010-06-18 ENCOUNTER — Other Ambulatory Visit: Payer: Self-pay | Admitting: Family Medicine

## 2010-06-18 ENCOUNTER — Ambulatory Visit
Admission: RE | Admit: 2010-06-18 | Discharge: 2010-06-18 | Payer: Self-pay | Source: Home / Self Care | Attending: Family Medicine | Admitting: Family Medicine

## 2010-06-18 ENCOUNTER — Other Ambulatory Visit (HOSPITAL_COMMUNITY)
Admission: RE | Admit: 2010-06-18 | Discharge: 2010-06-18 | Disposition: A | Discharge status: Home / Self Care | Payer: BC Managed Care – HMO | Source: Ambulatory Visit | Attending: Family Medicine | Admitting: Family Medicine

## 2010-06-18 DIAGNOSIS — Z01419 Encounter for gynecological examination (general) (routine) without abnormal findings: Secondary | ICD-10-CM | POA: Insufficient documentation

## 2010-06-21 NOTE — Assessment & Plan Note (Signed)
Summary: CHEST & HEAD HURT/PS   Vital Signs:  Patient profile:   36 year old female Menstrual status:  regular Weight:      210 pounds O2 Sat:      100 % on Room air Temp:     99.7 degrees F oral Pulse rate:   89 / minute Pulse rhythm:   regular BP sitting:   130 / 80  (left arm) Cuff size:   large  Vitals Entered By: Sid Falcon LPN (June 21, 2009 5:04 PM)  O2 Flow:  Room air CC: Chest congestion, headache, fever   History of Present Illness: Acute onset yesterday of bodyaches, low-grade fever, headaches, dry cough, sinus pressure and intermittent chills. Mild sore throat. Poor appetite but drinking fluids. No rashes. No flu vaccine. Has not taken any over-the-counter medications.  Preventive Screening-Counseling & Management  Alcohol-Tobacco     Smoking Status: quit  Allergies (verified): No Known Drug Allergies  Past History:  Past Medical History: Last updated: 11/23/2008 Chicken pox Depressio Frequent headaches Hypertension UTI Sickle Cell trait Herpes PMH reviewed for relevance  Social History: Smoking Status:  quit  Review of Systems  The patient denies chest pain, dyspnea on exertion, prolonged cough, and hemoptysis.    Physical Exam  General:  Well-developed,well-nourished,in no acute distress; alert,appropriate and cooperative throughout examination Head:  Normocephalic and atraumatic without obvious abnormalities. No apparent alopecia or balding. Eyes:  no injection.   Ears:  External ear exam shows no significant lesions or deformities.  Otoscopic examination reveals clear canals, tympanic membranes are intact bilaterally without bulging, retraction, inflammation or discharge. Hearing is grossly normal bilaterally. Nose:  External nasal examination shows no deformity or inflammation. Nasal mucosa are pink and moist without lesions or exudates. Mouth:  Oral mucosa and oropharynx without lesions or exudates.  Teeth in good repair. Neck:  No  deformities, masses, or tenderness noted. Lungs:  Normal respiratory effort, chest expands symmetrically. Lungs are clear to auscultation, no crackles or wheezes. Heart:  Normal rate and regular rhythm. S1 and S2 normal without gallop, murmur, click, rub or other extra sounds. Skin:  no rash Cervical Nodes:  No lymphadenopathy noted   Impression & Recommendations:  Problem # 1:  VIRAL INFECTION (ICD-079.99) Possible influenza. Start Tamiflu  Complete Medication List: 1)  Hydrochlorothiazide 25 Mg Tabs (Hydrochlorothiazide) .... One by mouth once daily 2)  Lotrisone 1-0.05 % Crea (Clotrimazole-betamethasone) .... Apply to affected rash two times a day as needed 3)  Hydrocodone-acetaminophen 5-325 Mg Tabs (Hydrocodone-acetaminophen) .Marland Kitchen.. 1-2 by mouth q 4-6 hours as needed headache 4)  Tamiflu 75 Mg Caps (Oseltamivir phosphate) .... One by mouth two times a day for 5 days  Patient Instructions: 1)  Get plenty of rest, drink lots of clear liquids, and use Tylenol or Ibuprofen for fever and comfort. Return in 7-10 days if you're not better: sooner if you'er feeling worse.  Prescriptions: TAMIFLU 75 MG CAPS (OSELTAMIVIR PHOSPHATE) one by mouth two times a day for 5 days  #10 x 0   Entered and Authorized by:   Evelena Peat MD   Signed by:   Evelena Peat MD on 06/21/2009   Method used:   Electronically to        [facility] [id number] (retail)       [address]       [place], [place]  [address]       Ph: [phone]  Fax: [phone]   RxID:   [id number]

## 2010-06-21 NOTE — Assessment & Plan Note (Signed)
Summary: low abd/STD check/dm   Vital Signs:  Patient profile:   35 year old female Menstrual status:  regular Weight:      224 pounds Temp:     98.4 degrees F Pulse rate:   90 / minute Pulse rhythm:   regular BP sitting:   130 / 80  (left arm)  Vitals Entered By: Pura Spice, RN (November 29, 2009 9:35 AM) CC: wants to be ck'd for STD    History of Present Illness: Pt exposed to STD, chlamydia and rule out GC, also ha slift increase frequemcy itching urethra, no diacharge nor vaginal irritation BP controlled Has gaine 12 ;bs   Allergies (verified): No Known Drug Allergies  Past History:  Past Medical History: Last updated: 11/23/2008 Chicken pox Depressio Frequent headaches Hypertension UTI Sickle Cell trait Herpes  Past Surgical History: Last updated: 10/20/2008 C-Section 3 times Tubal ligation  Social History: Last updated: 10/20/2008 Occupation: [employer] (9th grade education) Single Alcohol use-no not a current smoker  Risk Factors: Smoking Status: quit (06/21/2009)  Review of Systems      See HPI  The patient denies anorexia, fever, weight loss, weight gain, vision loss, decreased hearing, hoarseness, chest pain, syncope, dyspnea on exertion, peripheral edema, prolonged cough, headaches, hemoptysis, abdominal pain, melena, hematochezia, severe indigestion/heartburn, hematuria, incontinence, genital sores, muscle weakness, suspicious skin lesions, transient blindness, difficulty walking, depression, unusual weight change, abnormal bleeding, enlarged lymph nodes, angioedema, breast masses, and testicular masses.    Physical Exam  General:  Well-developed,well-nourished,in no acute distress; alert,appropriate and cooperative throughout examinationoverweight-appearing.   Lungs:  Normal respiratory effort, chest expands symmetrically. Lungs are clear to auscultation, no crackles or wheezes. Heart:  Normal rate and regular rhythm. S1 and S2 normal  without gallop, murmur, click, rub or other extra sounds. Abdomen:  obese but otherwizse neg Rectal:  not examined Genitalia:  not examined   Impression & Recommendations:  Problem # 1:  CHLAMYDIAL INFECTION (ICD-099.41) Assessment New  Orders: T-Chlamydia & GC Probe, Urine ([id number])  Problem # 2:  UTI (ICD-599.0) Assessment: New  Orders: UA Dipstick w/o Micro (automated)  (81003)  Problem # 3:  HYPERTENSION (ICD-401.9) Assessment: Improved  Her updated medication list for this problem includes:    Hydrochlorothiazide 25 Mg Tabs (Hydrochlorothiazide) ..... One by mouth once daily  Problem # 4:  EXOGENOUS OBESITY (ICD-278.00) Assessment: Deteriorated  Complete Medication List: 1)  Hydrochlorothiazide 25 Mg Tabs (Hydrochlorothiazide) .... One by mouth once daily 2)  Lotrisone 1-0.05 % Crea (Clotrimazole-betamethasone) .... Apply to affected rash two times a day as needed 3)  Naproxen 500 Mg Tabs (Naproxen) .... One by mouth two times a day as needed pain  Patient Instructions: 1)  Will get lab results and notify you regardiing treatment 2)  You need to lose weight. Consider a lower calorie diet and regular exercise.   Appended Document: Orders Update    Clinical Lists Changes  Observations: Added new observation of COMMENTS: Wynona Canes, CMA  November 29, 2009 12:17 PM  (11/29/2009 12:17) Added new observation of PH URINE: 7.0  (11/29/2009 12:17) Added new observation of SPEC GR URIN: 1.015  (11/29/2009 12:17) Added new observation of APPEARANCE U: Hazy  (11/29/2009 12:17) Added new observation of UA COLOR: yellow  (11/29/2009 12:17) Added new observation of WBC DIPSTK U: 1+  (11/29/2009 12:17) Added new observation of NITRITE URN: negative  (11/29/2009 12:17) Added new observation of UROBILINOGEN: 0.2  (11/29/2009 12:17) Added new observation of PROTEIN, URN: negative  (  11/29/2009 12:17) Added new observation of BLOOD UR DIP: trace-intact  (11/29/2009  12:17) Added new observation of KETONES URN: negative  (11/29/2009 12:17) Added new observation of BILIRUBIN UR: negative  (11/29/2009 12:17) Added new observation of GLUCOSE, URN: negative  (11/29/2009 12:17)      Laboratory Results   Urine Tests  Date/Time Recieved: November 29, 2009 12:17 PM  Date/Time Reported: November 29, 2009 12:17 PM   Routine Urinalysis   Color: yellow Appearance: Hazy Glucose: negative   (Normal Range: Negative) Bilirubin: negative   (Normal Range: Negative) Ketone: negative   (Normal Range: Negative) Spec. Gravity: 1.015   (Normal Range: 1.003-1.035) Blood: trace-intact   (Normal Range: Negative) pH: 7.0   (Normal Range: 5.0-8.0) Protein: negative   (Normal Range: Negative) Urobilinogen: 0.2   (Normal Range: 0-1) Nitrite: negative   (Normal Range: Negative) Leukocyte Esterace: 1+   (Normal Range: Negative)    Comments: Wynona Canes, CMA  November 29, 2009 12:17 PM     Appended Document: low abd/STD check/dm 1+ leukocytes and positive for Chlamydia or treating with doxycycline 100 mg b.i.d. for 7 days

## 2010-06-21 NOTE — Progress Notes (Signed)
Summary: [facility] ??   Phone Note From Other Clinic   Caller: [facility] Merry Proud [phone] Summary of Call: caled requesting information about date of treatment  treatment,etc  Initial call taken by: Pura Spice, RN,  December 28, 2009 10:52 AM  Follow-up for Phone Call        notiified  nurse of date Follow-up by: Judithann Sheen MD,  January 02, 2010 4:35 PM

## 2010-06-21 NOTE — Assessment & Plan Note (Signed)
Summary: abd pain/njr   Vital Signs:  Patient profile:   35 year old female Menstrual status:  regular Weight:      221 pounds Temp:     97.8 degrees F oral BP sitting:   122 / 84  (left arm) Cuff size:   large  Vitals Entered By: Sid Falcon LPN (February 15, 2010 9:52 AM)  History of Present Illness: Patient seen for the following  Onset 2 days ago midepigastric abdominal pain. Symptoms are somewhat intermittent. Decreased appetite. One episode of vomiting. No hematemesis. Had some mild diarrhea Tuesday but none since then.  pain does not radiate. No exacerbating factors. No alleviating factors.  No RUQ pain.  Patient also described some suprapubic discomfort. Urine frequency and increased thirst but no burning with urination. Recently treated with doxycycline for Chlamydia. She is concerned about possibility of non-irradication. No recent vaginal discharge.  Patient relates history of gestational diabetes. Is concerned about diabetes risk.  Recurrent pruritic rash waist region. Previously treated Lotrisone cream and responded.  Allergies (verified): No Known Drug Allergies  Past History:  Past Medical History: Last updated: 11/23/2008 Chicken pox Depressio Frequent headaches Hypertension UTI Sickle Cell trait Herpes PMH reviewed for relevance  Review of Systems       The patient complains of anorexia.  The patient denies fever, weight loss, melena, hematochezia, severe indigestion/heartburn, incontinence, and suspicious skin lesions.    Physical Exam  General:  Well-developed,well-nourished,in no acute distress; alert,appropriate and cooperative throughout examination Mouth:  Oral mucosa and oropharynx without lesions or exudates.  Teeth in good repair. Neck:  No deformities, masses, or tenderness noted. Lungs:  Normal respiratory effort, chest expands symmetrically. Lungs are clear to auscultation, no crackles or wheezes. Heart:  Normal rate and regular  rhythm. S1 and S2 normal without gallop, murmur, click, rub or other extra sounds. Abdomen:  soft, non-tender, normal bowel sounds, no distention, no masses, no guarding, no rigidity, no hepatomegaly, and no splenomegaly.   Skin:  patient has somewhat of dry scaly rash waist area and also extending into the right upper posterior thigh and buttock region. No vesicles or pustules.   Impression & Recommendations:  Problem # 1:  SKIN RASH (ICD-782.1) Assessment Deteriorated ?tinea.  Nystatin cream. The following medications were removed from the medication list:    Lotrisone 1-0.05 % Crea (Clotrimazole-betamethasone) .Marland Kitchen... Apply to affected rash two times a day as needed Her updated medication list for this problem includes:    Nystatin 100000 Unit/gm Crea (Nystatin) .Marland Kitchen... Apply to affected rash two times a day as needed  Problem # 2:  ABDOMINAL PAIN, EPIGASTRIC (ICD-789.06) ?gastritis. She will try OTC antacid.  Problem # 3:  POLYURIA (ICD-788.42) check glucose. Orders: UA Dipstick w/o Micro (automated)  (81003) Glucose, (CBG) (16109) T-Chlamydia  Probe, urine ([id number]) Venipuncture (91478) Specimen Handling (29562)  Problem # 4:  CHLAMYDIAL INFECTION (ICD-099.41)  hx of.  Repeat STD screening.  Orders: T-Chlamydia  Probe, urine [id number]) T-GC Probe, urine [id number]) T-HIV Antibody  (Reflex) [id number]) T-RPR (Syphilis) ([id number]) Venipuncture (25956) Specimen Handling (38756)  Complete Medication List: 1)  Hydrochlorothiazide 25 Mg Tabs (Hydrochlorothiazide) .... One by mouth once daily 2)  Nystatin 100000 Unit/gm Crea (Nystatin) .... Apply to affected rash two times a day as needed  Patient Instructions: 1)  Try Zantac or TUMS for symptom relief of abdominal discomfort. Prescriptions: NYSTATIN 100000 UNIT/GM CREA (NYSTATIN) apply to affected rash two times a day as needed  #30 gm x 1   Entered by:  Sid Falcon LPN   Authorized by:   Evelena Peat  MD   Signed by:   Sid Falcon LPN on 95/28/4132   Method used:   Printed then faxed to ...       [facility] [id number] (retail)       [address] [place], [place]  [address]       Ph: [phone]       Fax: [phone]   RxID:   [id number] NYSTATIN 100000 UNIT/GM CREA (NYSTATIN) apply to affected rash two times a day as needed  #30 gm x 1   Entered and Authorized by:   Evelena Peat MD   Signed by:   Evelena Peat MD on 02/15/2010   Method used:   Electronically to        [facility] [id number] (retail)       [address]       [place], [place]  [address]       Ph: [phone]       Fax: [phone]   RxID:   [id number]  Pt called, reported [facility] is down, she [place] the other side [place], will print and fax RX, confirmation received, pt informed. Sid Falcon LPN  February 15, 2010 4:26 PM  Laboratory Results   Urine Tests  Date/Time Recieved: February 15, 2010 11:55 AM  Date/Time Reported: February 15, 2010 11:55 AM   Routine Urinalysis   Color: yellow Appearance: Clear Glucose: negative   (Normal Range: Negative) Bilirubin: negative   (Normal Range: Negative) Ketone: negative   (Normal Range: Negative) Spec. Gravity: 1.020   (Normal Range: 1.003-1.035) Blood: 1+   (Normal Range: Negative) pH: 6.0   (Normal Range: 5.0-8.0) Protein: negative   (Normal Range: Negative) Urobilinogen: 0.2   (Normal Range: 0-1) Nitrite: negative   (Normal Range: Negative) Leukocyte Esterace: trace   (Normal Range: Negative)    Comments: Wynona Canes, CMA  February 15, 2010 11:55 AM   Blood Tests   Date/Time Recieved: February 15, 2010 10:26 AM  Date/Time Reported: February 15, 2010 10:26 AM   CBG Fasting: 100  Comments: Wynona Canes, CMA  February 15, 2010 10:27 AM

## 2010-06-21 NOTE — Progress Notes (Signed)
Summary: Pt has questions re: getting some procedures done  Phone Note Call from Patient Call back at Home Phone [phone]   Caller: Patient Summary of Call: Pt called and wants to talk about a procedure to get tubes untied and get colon cleaned. Pls call. Pt was advise that an ov for consult may have to be made.  Initial call taken by: Lucy Antigua,  September 01, 2009 11:59 AM  Follow-up for Phone Call        spoke with pt.  Recommended against any "colon cleansing" and explained I am not aware of any evidence that this is beneficial.  I have recommended that she exercise, drink plenty of fluids, and fiber.   She may be interested in tubal ligation reversal and she would need to see GYN and she will let me know. Follow-up by: Evelena Peat MD,  September 01, 2009 5:15 PM

## 2010-06-21 NOTE — Assessment & Plan Note (Signed)
Summary: n/v, diarrhea/cdw   Vital Signs:  Patient profile:   35 year old female Menstrual status:  regular Temp:     99.6 degrees F oral BP sitting:   120 / 80  (left arm) Cuff size:   large  Vitals Entered By: Sid Falcon LPN (July 25, 5174 2:02 PM) CC: Nausea & Vomiting & diarrhea x 4 days   History of Present Illness: Acute visit.  Onset Friday of nausea and vomiting and diarrhea. No vomiting today. Has generalized weakness. Denies any fever. No bloody stools. Denies any respiratory symptoms. No leg cramps. Keeping down some fluids. Very poor appetite. No abdominal pain.  Allergies (verified): No Known Drug Allergies  Past History:  Past Medical History: Last updated: 11/23/2008 Chicken pox Depressio Frequent headaches Hypertension UTI Sickle Cell trait Herpes PMH reviewed for relevance  Review of Systems      See HPI  Physical Exam  General:  Well-developed,well-nourished,in no acute distress; alert,appropriate and cooperative throughout examination Ears:  External ear exam shows no significant lesions or deformities.  Otoscopic examination reveals clear canals, tympanic membranes are intact bilaterally without bulging, retraction, inflammation or discharge. Hearing is grossly normal bilaterally. Mouth:  Oral mucosa and oropharynx without lesions or exudates.  Teeth in good repair. Neck:  No deformities, masses, or tenderness noted. Lungs:  Normal respiratory effort, chest expands symmetrically. Lungs are clear to auscultation, no crackles or wheezes. Heart:  Normal rate and regular rhythm. S1 and S2 normal without gallop, murmur, click, rub or other extra sounds. Abdomen:  soft, non-tender, normal bowel sounds, no distention, no masses, no hepatomegaly, and no splenomegaly.     Impression & Recommendations:  Problem # 1:  GASTROENTERITIS, VIRAL (ICD-008.8) phenergan for nausea.  Complete Medication List: 1)  Hydrochlorothiazide 25 Mg Tabs  (Hydrochlorothiazide) .... One by mouth once daily 2)  Lotrisone 1-0.05 % Crea (Clotrimazole-betamethasone) .... Apply to affected rash two times a day as needed 3)  Promethazine Hcl 25 Mg Tabs (Promethazine hcl) .... One by mouth q 4-6 hours as needed nausea and vomiting.  Patient Instructions: 1)  The main problem with gastroentereritis is dehydration. Drink plenty of fluids and take solids as you feel better. If you are unable to keep anything down and/or you show signs of dehydration( dry cracked lips, lack of tears, not urinating, very sleepy) , call our office.  Prescriptions: PROMETHAZINE HCL 25 MG TABS (PROMETHAZINE HCL) one by mouth q 4-6 hours as needed nausea and vomiting.  #10 x 0   Entered and Authorized by:   Evelena Peat MD   Signed by:   Evelena Peat MD on 07/24/2009   Method used:   Electronically to        [facility] [id number] (retail)       [address] [place], [place]  [address]       Ph: [phone]       Fax: [phone]   RxID:   [id number]

## 2010-06-21 NOTE — Progress Notes (Signed)
Summary: ok to leave message on machine  Phone Note Call from Patient Call back at Home Phone [phone]   Caller: Patient Call For: Evelena Peat MD Summary of Call: pt is returning christy call ok to leave message on machine. Initial call taken by: Heron Sabins,  February 19, 2010 11:45 AM  Follow-up for Phone Call        Patient informed Follow-up by: Josph Macho RMA,  February 19, 2010 12:07 PM

## 2010-06-21 NOTE — Progress Notes (Signed)
Summary: REQ FOR REFILL RX Lotrisone  Phone Note Call from Patient   Caller: Patient [phone] Reason for Call: Refill Medication Summary of Call: Pt req a refill on med: Lotrisone 1-0.05 % Crea (Clotrimazole-betamethasone) .... Pt adv that she has broke out with the same rash as before and would like to have a refill Rx for the same cream that she was prescribed before sent to [facility] on [address]... Pt can be reached at [phone] with any questions or concerns.  Initial call taken by: Debbra Riding,  August 07, 2009 10:44 AM  Follow-up for Phone Call        OK to refill 15 gram tube with 2 refills. Follow-up by: Evelena Peat MD,  August 07, 2009 1:01 PM  Additional Follow-up for Phone Call Additional follow up Details #1::        Rx sent, pt informed Additional Follow-up by: Sid Falcon LPN,  August 08, 2009 9:24 AM    Prescriptions: LOTRISONE 1-0.05 % CREA (CLOTRIMAZOLE-BETAMETHASONE) apply to affected rash two times a day as needed  #15 gm x 2   Entered by:   Sid Falcon LPN   Authorized by:   Evelena Peat MD   Signed by:   Sid Falcon LPN on 29/56/2130   Method used:   Electronically to        [facility] [id number] (retail)       [address] [place], [place]  [address]       Ph: [phone]       Fax: [phone]   RxID:   [id number]

## 2010-06-21 NOTE — Assessment & Plan Note (Signed)
Summary: PT FELL 2 DAYS AGO//CCM   Vital Signs:  Patient profile:   35 year old female Menstrual status:  regular Weight:      213 pounds Temp:     98.2 degrees F oral BP sitting:   134 / 88  (left arm) Cuff size:   large  Vitals Entered By: Sid Falcon LPN (August 17, 2009 9:28 AM) CC: pt fell 2 days ago, aching all over   History of Present Illness: Fall 2 days ago going down stairs.  Arbutus Ped of stair caused her to fall. Landed on buttock and R lower back. Now diffuse soreness and achy pain low back radiating up toward thoracic area. Worse with movement.  Taken no meds.  No radiculopathy symptoms. No incontinence.  Allergies (verified): No Known Drug Allergies  Past History:  Past Medical History: Last updated: 11/23/2008 Chicken pox Depressio Frequent headaches Hypertension UTI Sickle Cell trait Herpes  Review of Systems      See HPI  Physical Exam  General:  Well-developed,well-nourished,in no acute distress; alert,appropriate and cooperative throughout examination Lungs:  Normal respiratory effort, chest expands symmetrically. Lungs are clear to auscultation, no crackles or wheezes. Heart:  Normal rate and regular rhythm. S1 and S2 normal without gallop, murmur, click, rub or other extra sounds. Msk:  no visible bruising minimal tenderness R lower back normal ROM back Extremities:  No clubbing, cyanosis, edema, or deformity noted with normal full range of motion of all joints.   Neg SLRs   Neurologic:  strength normal in all extremities, sensation intact to light touch, and DTRs symmetrical and normal.      Impression & Recommendations:  Problem # 1:  LUMBAR STRAIN, ACUTE (ICD-847.2) Naproxen for pain.  Work note for this week.  Suspect largely contusion and muscle strain.  Complete Medication List: 1)  Hydrochlorothiazide 25 Mg Tabs (Hydrochlorothiazide) .... One by mouth once daily 2)  Lotrisone 1-0.05 % Crea (Clotrimazole-betamethasone) .... Apply  to affected rash two times a day as needed 3)  Naproxen 500 Mg Tabs (Naproxen) .... One by mouth two times a day as needed pain  Patient Instructions: 1)  Follow up if no better in a couple of weeks. Prescriptions: NAPROXEN 500 MG TABS (NAPROXEN) one by mouth two times a day as needed pain  #30 x 0   Entered and Authorized by:   Evelena Peat MD   Signed by:   Evelena Peat MD on 08/17/2009   Method used:   Electronically to        [facility] [id number] (retail)       [address] [place], [place]  [address]       Ph: [phone]       Fax: [phone]   RxID:   [id number]

## 2010-06-21 NOTE — Progress Notes (Signed)
Summary: written rx  Phone Note Call from Patient Call back at Home Phone [phone]   Caller: Patient Call For: Evelena Peat MD Summary of Call: pt stated kerr on [employer] e-precribe system is down. Pt said she needs written rx to take to pharm. .  Initial call taken by: Heron Sabins,  February 15, 2010 3:15 PM  Follow-up for Phone Call        OK to call in her script for Nystatin cream (see progress note) Follow-up by: Evelena Peat MD,  February 15, 2010 4:00 PM

## 2010-06-27 NOTE — Assessment & Plan Note (Signed)
Summary: CPX/PAP/CJR   Vital Signs:  Patient profile:   35 year old female Menstrual status:  regular Weight:      217 pounds Temp:     98.7 degrees F oral Pulse rate:   80 / minute Pulse rhythm:   regular Resp:     12 per minute BP sitting:   140 / 90  (left arm) Cuff size:   large  Vitals Entered By: Sid Falcon LPN (June 18, 2010 3:09 PM)  History of Present Illness: Here for CPE.  No regular exercise.  PMH, SH, and FH reviewed. No changes.  Tetanus is up to date.  Clinical Review Panels:  Prevention   Last Pap Smear:  NEGATIVE FOR INTRAEPITHELIAL LESIONS OR MALIGNANCY. (11/23/2008)  Immunizations   Last Tetanus Booster:  Tdap (11/23/2008)  Lipid Management   Cholesterol:  144 (06/12/2010)   LDL (bad choesterol):  100 (06/12/2010)   HDL (good cholesterol):  36.40 (06/12/2010)  Diabetes Management   Creatinine:  0.7 (06/12/2010)  CBC   WBC:  5.2 (06/12/2010)   RBC:  3.88 (06/12/2010)   Hgb:  11.1 (06/12/2010)   Hct:  33.2 (06/12/2010)   Platelets:  328.0 (06/12/2010)   MCV  85.6 (06/12/2010)   MCHC  33.5 (06/12/2010)   RDW  14.9 (06/12/2010)   PMN:  44.9 (06/12/2010)   Lymphs:  46.0 (06/12/2010)   Monos:  6.2 (06/12/2010)   Eosinophils:  2.4 (06/12/2010)   Basophil:  0.5 (06/12/2010)  Complete Metabolic Panel   Glucose:  84 (06/12/2010)   Sodium:  140 (06/12/2010)   Potassium:  4.0 (06/12/2010)   Chloride:  106 (06/12/2010)   CO2:  27 (06/12/2010)   BUN:  10 (06/12/2010)   Creatinine:  0.7 (06/12/2010)   Albumin:  3.7 (06/12/2010)   Total Protein:  6.6 (06/12/2010)   Calcium:  9.0 (06/12/2010)   Total Bili:  0.5 (06/12/2010)   Alk Phos:  55 (06/12/2010)   SGPT (ALT):  9 (06/12/2010)   SGOT (AST):  15 (06/12/2010)   Allergies (verified): No Known Drug Allergies  Past History:  Past Medical History: Last updated: 11/23/2008 Chicken pox Depressio Frequent headaches Hypertension UTI Sickle Cell trait Herpes  Past Surgical  History: Last updated: 10/20/2008 C-Section 3 times Tubal ligation  Family History: Last updated: 10/20/2008 Family History of Arthritis, parent Family History Hypertension, parent, blood relative family history diabetes, parent, blood relative  Social History: Last updated: 10/20/2008 Occupation: [employer] (9th grade education) Single Alcohol use-no not a current smoker  Risk Factors: Smoking Status: quit (06/21/2009) PMH-FH-SH reviewed for relevance  Review of Systems       The patient complains of weight gain.  The patient denies anorexia, fever, weight loss, vision loss, decreased hearing, hoarseness, chest pain, syncope, dyspnea on exertion, peripheral edema, prolonged cough, headaches, hemoptysis, abdominal pain, melena, hematochezia, severe indigestion/heartburn, hematuria, incontinence, genital sores, muscle weakness, suspicious skin lesions, transient blindness, difficulty walking, depression, unusual weight change, abnormal bleeding, enlarged lymph nodes, and breast masses.    Physical Exam  General:  Well-developed,well-nourished,in no acute distress; alert,appropriate and cooperative throughout examination Head:  normocephalic, atraumatic, and no abnormalities observed.   Eyes:  No corneal or conjunctival inflammation noted. EOMI. Perrla. Funduscopic exam benign, without hemorrhages, exudates or papilledema. Vision grossly normal. Ears:  External ear exam shows no significant lesions or deformities.  Otoscopic examination reveals clear canals, tympanic membranes are intact bilaterally without bulging, retraction, inflammation or discharge. Hearing is grossly normal bilaterally. Mouth:  Oral mucosa and  oropharynx without lesions or exudates.  Teeth in good repair. Neck:  No deformities, masses, or tenderness noted. Breasts:  No mass, nodules, thickening, tenderness, bulging, retraction, inflamation, nipple discharge or skin changes noted.   Lungs:  Normal  respiratory effort, chest expands symmetrically. Lungs are clear to auscultation, no crackles or wheezes. Heart:  Normal rate and regular rhythm. S1 and S2 normal without gallop, murmur, click, rub or other extra sounds. Abdomen:  Bowel sounds positive,abdomen soft and non-tender without masses, organomegaly or hernias noted. Genitalia:  Normal introitus for age, no external lesions,  thick white vaginal discharge, mucosa pink and moist, no vaginal or cervical lesions, no vaginal atrophy, no friaility or hemorrhage, normal uterus size and position, no adnexal masses or tenderness Extremities:  No clubbing, cyanosis, edema, or deformity noted with normal full range of motion of all joints.   Neurologic:  alert & oriented X3 and cranial nerves II-XII intact.   Skin:  no rashes and no suspicious lesions.   Cervical Nodes:  No lymphadenopathy noted Psych:  Cognition and judgment appear intact. Alert and cooperative with normal attention span and concentration. No apparent delusions, illusions, hallucinations   Impression & Recommendations:  Problem # 1:  ROUTINE GENERAL MEDICAL EXAM@HEALTH  CARE FACL (ICD-V70.0) labs reviewed with pt.  Pap smear obtained.  Work on more consistent exercise and weight loss. Get back on HCTZ for BP control.   Orders: Pap Smear, Thin Prep ( Collection of) (Z6109)  Complete Medication List: 1)  Hydrochlorothiazide 25 Mg Tabs (Hydrochlorothiazide) .... One by mouth once daily 2)  Nystatin 100000 Unit/gm Crea (Nystatin) .... Apply to affected rash two times a day as needed  Patient Instructions: 1)  Please schedule a follow-up appointment in 1 year.  2)  It is important that you exercise reguarly at least 20 minutes 5 times a week. If you develop chest pain, have severe difficulty breathing, or feel very tired, stop exercising immediately and seek medical attention.  3)  You need to lose weight. Consider a lower calorie diet and regular exercise.   Prescriptions: NYSTATIN 100000 UNIT/GM CREA (NYSTATIN) apply to affected rash two times a day as needed  #30 gm x 1   Entered and Authorized by:   Evelena Peat MD   Signed by:   Evelena Peat MD on 06/18/2010   Method used:   Print then Give to Patient   RxID:   [id number] HYDROCHLOROTHIAZIDE 25 MG TABS (HYDROCHLOROTHIAZIDE) one by mouth once daily  #90 x 3   Entered and Authorized by:   Evelena Peat MD   Signed by:   Evelena Peat MD on 06/18/2010   Method used:   Print then Give to Patient   RxID:   [id number]    Orders Added: 1)  Est. Patient 18-39 years [29528] 2)  Pap Smear, Thin Prep ( Collection of) [Q0091]

## 2010-09-03 LAB — POCT PREGNANCY, URINE: Preg Test, Ur: NEGATIVE

## 2010-09-03 LAB — URINALYSIS, ROUTINE W REFLEX MICROSCOPIC
Bilirubin Urine: NEGATIVE
Glucose, UA: NEGATIVE mg/dL
Nitrite: NEGATIVE
Specific Gravity, Urine: 1.019 (ref 1.005–1.030)
pH: 6 (ref 5.0–8.0)

## 2010-09-03 LAB — URINE CULTURE

## 2010-09-03 LAB — URINE MICROSCOPIC-ADD ON

## 2010-10-05 NOTE — Discharge Summary (Signed)
Jamie Allison, Jamie Allison                        ACCOUNT NO.:  [id number]   MEDICAL RECORD NO.:  [id number]                   PATIENT TYPE:  INP   LOCATION:  9101                                 FACILITY:  [facility]   PHYSICIAN:  Conni Elliot, M.D.             DATE OF BIRTH:  April 05, 1976   DATE OF ADMISSION:  06/06/2003  DATE OF DISCHARGE:                                 DISCHARGE SUMMARY   DISCHARGE DIAGNOSES:  1. Repeat low transverse cesarean section secondary to concern for     preeclampsia.  The patient has chronic hypertension, question of     pregnancy-induced hypertension, and developed headaches and visual     changes during an admission for preeclampsia.  2. Bilateral tubal ligation.  3. Chronic hypertension.   HOSPITAL COURSE:  The patient on admission was a 35 year old gravida 4, para  1-2-1-3, who presented at 38-4/7 weeks according to an 11-week ultrasound.  The chief complaint was increased blood pressures and headache.  Her blood  pressure on presentation was 170/110.  The patient does have a history of  chronic hypertension.  Prepregnancy her systolic blood pressures have run in  the 140s systolic.  So she was admitted and PIH labs were done, were within  normal limits.  A 24-hour urine was less than 300.  The patient developed  headache with visual scotoma and was taken to C-section, repeat low  transverse C-section on the 18th.  She also had a bilateral tubal ligation  at that time.  Postop the patient was monitored in the AICU and was placed  on magnesium for a total of 24 hours.  Her systolic blood pressures were  elevated in the 180 range.  Her labetalol dose was increased from 200 mg  b.i.d. to 300 mg b.i.d. and subsequently on the next day on June 09, 2003, the patient also had elevated blood pressures as high as 185 and the  decision was made to start hydrochlorothiazide.  On the day of discharge the  patient is feeling well and is without complaint.  Her  systolic blood  pressure is 154, diastolic is 82.  She is set for discharge if stable and  will be followed up in [facility] clinic in one week to help manage her chronic  hypertension.  She will be continued on the hydrochlorothiazide 50 mg daily  and the labetalol 300 mg p.o. b.i.d.  Recommend that she have a BMP checked  in one week given the start of hydrochlorothiazide.  The patient will be  followed up in the [facility] clinic in four to six weeks for routine check-up.  She  should notify her doctor for any concerns whatsoever, including temperature,  worsening pain, or vaginal discharge, headache.  She will have her staples  removed before discharge.     [name], M.D.  Conni Elliot, M.D.    GO/MEDQ  D:  06/10/2003  T:  06/10/2003  Job:  [id number]

## 2010-10-05 NOTE — Op Note (Signed)
[name], Jamie Allison                        ACCOUNT NO.:  [id number]   MEDICAL RECORD NO.:  [id number]                   PATIENT TYPE:  INP   LOCATION:  9372                                 FACILITY:  [facility]   PHYSICIAN:  Conni Elliot, M.D.             DATE OF BIRTH:  [birth year]   DATE OF PROCEDURE:  06/07/2003  DATE OF DISCHARGE:                                 OPERATIVE REPORT   PREOPERATIVE DIAGNOSES:  1. Intrauterine pregnancy at term with two prior cesarean deliveries.  2. Desire for surgical sterilization.  3. Severe preeclampsia.   POSTOPERATIVE DIAGNOSES:  1. Intrauterine pregnancy at term with two prior cesarean deliveries.  2. Desire for surgical sterilization.  3. Severe preeclampsia.   OPERATION:  1. Low transverse cesarean section.  2. Modified bilateral Pomeroy tubal ligation.   SURGEON:  Conni Elliot, M.D.   OPERATIVE FINDINGS:  A female infant with Apgars of 8 and 9.   ANESTHESIA:  Continuous lumbar epidural.   DESCRIPTION OF PROCEDURE:  After bringing the continuous lumbar epidural  anesthetic to operative level, the patient was supine in the left lateral  tilt position and was prepped and draped in a sterile fashion.  A  Pfannenstiel incision was made and the incision was made in the skin down to  the rectus fascia, the rectus muscle separated in the midline, the  peritoneal cavity entered and the bladder flap created.  A low transverse  uterine incision was made.  The baby was delivered from the vertex  presentation, the cord doubly clamped and cut, and the baby handed to the  neonatologist in attendance.  The placenta was delivered spontaneously.  The  uterus was closed in routine fashion.  The right fallopian tube was  identified and grasped with a Babcock clamp and followed out to the  fimbriated end.  A segment of tube was brought into the operative field,  doubly suture ligated, hemostasis was adequate.  A similar procedure was  done on the  opposite side.  Hemostasis was again adequate.  The anterior  peritoneum, fascia, subcutaneous, and skin closed in the usual fashion.  Estimated blood loss less than 800 mL.  Needle and sponge count correct.                                               Conni Elliot, M.D.    ASG/MEDQ  D:  06/07/2003  T:  06/08/2003  Job:  [id number]

## 2010-10-10 ENCOUNTER — Emergency Department (HOSPITAL_COMMUNITY): Payer: BC Managed Care – PPO

## 2010-10-10 ENCOUNTER — Emergency Department (HOSPITAL_COMMUNITY)
Admission: EM | Admit: 2010-10-10 | Discharge: 2010-10-10 | Disposition: A | Discharge status: Home / Self Care | Payer: BC Managed Care – PPO | Attending: Emergency Medicine | Admitting: Emergency Medicine

## 2010-10-10 ENCOUNTER — Emergency Department (HOSPITAL_COMMUNITY)
Admission: EM | Admit: 2010-10-10 | Discharge: 2010-10-11 | Disposition: A | Discharge status: Home / Self Care | Payer: BC Managed Care – PPO | Attending: Emergency Medicine | Admitting: Emergency Medicine

## 2010-10-10 DIAGNOSIS — Z79899 Other long term (current) drug therapy: Secondary | ICD-10-CM | POA: Insufficient documentation

## 2010-10-10 DIAGNOSIS — R51 Headache: Secondary | ICD-10-CM | POA: Insufficient documentation

## 2010-10-10 DIAGNOSIS — H53149 Visual discomfort, unspecified: Secondary | ICD-10-CM | POA: Insufficient documentation

## 2010-10-10 DIAGNOSIS — H5710 Ocular pain, unspecified eye: Secondary | ICD-10-CM | POA: Insufficient documentation

## 2010-10-10 DIAGNOSIS — I10 Essential (primary) hypertension: Secondary | ICD-10-CM | POA: Insufficient documentation

## 2010-10-10 DIAGNOSIS — J40 Bronchitis, not specified as acute or chronic: Secondary | ICD-10-CM | POA: Insufficient documentation

## 2010-10-10 DIAGNOSIS — R110 Nausea: Secondary | ICD-10-CM | POA: Insufficient documentation

## 2010-10-10 DIAGNOSIS — R059 Cough, unspecified: Secondary | ICD-10-CM | POA: Insufficient documentation

## 2010-10-10 DIAGNOSIS — R05 Cough: Secondary | ICD-10-CM | POA: Insufficient documentation

## 2010-10-10 DIAGNOSIS — J3489 Other specified disorders of nose and nasal sinuses: Secondary | ICD-10-CM | POA: Insufficient documentation

## 2010-10-10 DIAGNOSIS — R062 Wheezing: Secondary | ICD-10-CM | POA: Insufficient documentation

## 2010-10-15 ENCOUNTER — Observation Stay (HOSPITAL_COMMUNITY)
Admission: EM | Admit: 2010-10-15 | Discharge: 2010-10-17 | Disposition: A | Discharge status: Home / Self Care | Payer: BC Managed Care – PPO | Attending: Internal Medicine | Admitting: Internal Medicine

## 2010-10-15 DIAGNOSIS — D573 Sickle-cell trait: Secondary | ICD-10-CM | POA: Insufficient documentation

## 2010-10-15 DIAGNOSIS — B9689 Other specified bacterial agents as the cause of diseases classified elsewhere: Secondary | ICD-10-CM | POA: Insufficient documentation

## 2010-10-15 DIAGNOSIS — R1031 Right lower quadrant pain: Principal | ICD-10-CM | POA: Insufficient documentation

## 2010-10-15 DIAGNOSIS — R350 Frequency of micturition: Secondary | ICD-10-CM | POA: Insufficient documentation

## 2010-10-15 DIAGNOSIS — I10 Essential (primary) hypertension: Secondary | ICD-10-CM | POA: Insufficient documentation

## 2010-10-15 DIAGNOSIS — R112 Nausea with vomiting, unspecified: Secondary | ICD-10-CM | POA: Insufficient documentation

## 2010-10-15 DIAGNOSIS — N390 Urinary tract infection, site not specified: Secondary | ICD-10-CM | POA: Insufficient documentation

## 2010-10-15 LAB — CBC
HCT: 39.5 % (ref 36.0–46.0)
Hemoglobin: 13.7 g/dL (ref 12.0–15.0)
RBC: 4.94 MIL/uL (ref 3.87–5.11)
WBC: 7 10*3/uL (ref 4.0–10.5)

## 2010-10-15 LAB — URINALYSIS, ROUTINE W REFLEX MICROSCOPIC
Glucose, UA: NEGATIVE mg/dL
Ketones, ur: NEGATIVE mg/dL
Protein, ur: 100 mg/dL — AB

## 2010-10-15 LAB — DIFFERENTIAL
Basophils Absolute: 0 10*3/uL (ref 0.0–0.1)
Lymphocytes Relative: 49 % — ABNORMAL HIGH (ref 12–46)
Neutro Abs: 2.9 10*3/uL (ref 1.7–7.7)

## 2010-10-15 LAB — URINE MICROSCOPIC-ADD ON

## 2010-10-15 LAB — COMPREHENSIVE METABOLIC PANEL
ALT: 15 U/L (ref 0–35)
Alkaline Phosphatase: 62 U/L (ref 39–117)
CO2: 32 mEq/L (ref 19–32)
GFR calc non Af Amer: >60 mL/min (ref >60)
Glucose, Bld: 114 mg/dL — ABNORMAL HIGH (ref 70–99)
Potassium: 2.7 mEq/L — CL (ref 3.5–5.1)
Sodium: 136 mEq/L (ref 135–145)
Total Protein: 7.8 g/dL (ref 6.0–8.3)

## 2010-10-15 LAB — WET PREP, GENITAL

## 2010-10-16 ENCOUNTER — Emergency Department (HOSPITAL_COMMUNITY): Payer: BC Managed Care – PPO

## 2010-10-16 LAB — GC/CHLAMYDIA PROBE AMP, GENITAL: GC Probe Amp, Genital: NEGATIVE

## 2010-10-16 LAB — POCT I-STAT, CHEM 8
BUN: 15 mg/dL (ref 6–23)
Creatinine, Ser: 1.1 mg/dL (ref 0.4–1.2)
Sodium: 136 mEq/L (ref 135–145)
TCO2: 31 mmol/L (ref 0–100)

## 2010-10-16 LAB — MRSA PCR SCREENING: MRSA by PCR: NEGATIVE

## 2010-10-17 LAB — BASIC METABOLIC PANEL
CO2: 30 mEq/L (ref 19–32)
Calcium: 8.4 mg/dL (ref 8.4–10.5)
GFR calc Af Amer: >60 mL/min (ref >60)
GFR calc non Af Amer: >60 mL/min (ref >60)
Sodium: 140 mEq/L (ref 135–145)

## 2010-10-17 LAB — URINE CULTURE: Culture  Setup Time: 201205291241

## 2010-10-26 ENCOUNTER — Encounter: Payer: Self-pay | Admitting: Family Medicine

## 2010-10-29 ENCOUNTER — Ambulatory Visit (INDEPENDENT_AMBULATORY_CARE_PROVIDER_SITE_OTHER): Payer: BC Managed Care – PPO | Admitting: Family Medicine

## 2010-10-29 ENCOUNTER — Encounter: Payer: Self-pay | Admitting: Family Medicine

## 2010-10-29 VITALS — BP 140/84 | Temp 98.7°F | Ht 64.0 in | Wt 217.0 lb

## 2010-10-29 DIAGNOSIS — R1031 Right lower quadrant pain: Secondary | ICD-10-CM

## 2010-10-29 DIAGNOSIS — E876 Hypokalemia: Secondary | ICD-10-CM

## 2010-10-29 DIAGNOSIS — I10 Essential (primary) hypertension: Secondary | ICD-10-CM

## 2010-10-29 MED ORDER — AMLODIPINE BESYLATE 5 MG PO TABS: 5.0000 mg | ORAL_TABLET | Freq: Every day | ORAL | Status: DC

## 2010-10-29 NOTE — H&P (Signed)
NAMEMARGREAT, WIDENER              ACCOUNT NO.:  [id number]  MEDICAL RECORD NO.:  [id number]  LOCATION:                                 FACILITY:  PHYSICIAN:  Lonia Blood, M.D.       DATE OF BIRTH:  Feb 02, 1976  DATE OF ADMISSION: DATE OF DISCHARGE:                             HISTORY & PHYSICAL   PRIMARY CARE PHYSICIAN:  Evelena Peat, MD, [facility].  CHIEF COMPLAINT:  Right lower quadrant abdominal pain.  HISTORY OF PRESENT ILLNESS:  Ms. Lesmeister is a 35 year old woman with minimal past medical history but significant for three C-sections who presented today to the emergency room with complaints of severe right lower quadrant abdominal pain.  She also after a few hours' observation in the emergency room started having some nausea and vomited.  She had full investigations including a CT scan of abdomen and pelvis without a clear cause being identified.  Since she is unable to take oral medications and eat, she is referred for further observation.  The patient reports that the right lower quadrant pain started about 24 hours ago and has been progressively worse and only responded in the emergency room to opiates.  She also reported that the vomitus contains only acid and food and is not bilious or acid in nature.  She has not had any abdominal surgeries of the nature of cholecystectomy or appendectomy.  She denies any dysuria or frequent urinary tract infection.  PAST MEDICAL HISTORY: 1. Hypertension. 2. Sickle cell trait.  SOCIAL HISTORY:  Not smoking, not drinking, working as housekeeping.  FAMILY HISTORY:  Mother alive at age 10 with diabetes, hypertension. Father alive at age 38, unknown history.  One brother healthy.  Four children, one with sickle cell trait.  PAST SURGICAL HISTORY: 1. Three cesarean sections. 2. Bilateral tubal ligation.  REVIEW OF SYSTEMS:  Last bowel movement occurred about 2 days ago. Positive occasional cough, bronchitis-type  symptoms, otherwise per HPI negative.  HOME MEDICATIONS:  Hydrochlorothiazide.  ALLERGIES:  No known drug allergies.  PHYSICAL EXAMINATION:  VITAL SIGNS:  Temperature 98.5, blood pressure 145/77, pulse 64, respirations 18, saturating 100% on room air. GENERAL:  The patient is alert, oriented, in some moderate distress. HEAD:  Normocephalic, atraumatic. EYES:  Pupils equal, round, reactive to light and accommodation. Extraocular movements intact. THROAT:  Clear. NECK:  No JVD. CHEST:  Clear to auscultation without wheezes, rhonchi, or crackles. HEART:  Regular rate and rhythm without murmurs, rubs, or gallops. ABDOMEN:  Soft.  There is some minimal tenderness without rebound or guarding.  Hyperactive bowel sounds are appreciated. EXTREMITIES:  Lower extremities without edema. SKIN:  Covered with multiple tattoos.  No suspicious looking rashes. NEUROLOGIC:  Cranial nerves II through XII intact.  Strength 5/4 in upper extremities.  Sensation intact. MUSCULOSKELETAL:  Intact muscle bulk and tone.  LABORATORY VALUES ON ADMISSION:  White blood cell count is 7000, hemoglobin 13.7, platelet count 426.  Urinalysis shows leukocyte esterase and some hemoglobin, trichomonas, bacteria, white blood cells, red blood cells.  Sodium is 136, potassium 2.7, chloride 92, bicarbonate 32, BUN 15, creatinine 0.6, glucose 114.  CT scan of the abdomen and pelvis does not indicate  any appendicitis or pancreatitis.  I have personally reviewed the CT scan of the abdomen and I have noticed the patient has a very high stool burden.  ASSESSMENT AND PLAN:  This is a 35 year old woman presenting with right lower quadrant abdominal pain followed later by nausea and vomiting, placed on the current findings.  I do suspect she is severely constipated.  I think it is prudent to observe her overnight just to make sure this was not some form of appendicitis, was not recognized by CT scan.  I am going to go ahead and  start the patient on clear liquids, antiemetics, analgesics, and give her 60 g of MiraLax.  Since the patient has urinalysis positive leukocyte esterase and some lower abdominal type pain and nausea, I think it is wise to send urine culture.  I am not going to start any empiric antibiotics right now. The patient has hypokalemia due to hydrochlorothiazide, so we are going to replete that intravenously.  When she goes home, I am probably going to switch the hydrochlorothiazide to Dyazide.     Lonia Blood, M.D.     SL/MEDQ  D:  10/16/2010  T:  10/16/2010  Job:  [id number]  cc:   Evelena Peat, M.D.  Electronically Signed by Lonia Blood M.D. on 10/29/2010 09:39:15 AM

## 2010-10-29 NOTE — Progress Notes (Signed)
  Subjective:    Patient ID: Jamie Allison, female    DOB: 1975/11/21, 35 y.o.   MRN: [id number]  HPI Patient seen for hospital followup. She initially presented to emergency room with headache on 23rd of May. Felt to be viral illness. She then returned 10/15/2010 with right lower quadrant abdominal pain. CT abdomen and pelvis unremarkable. She had low potassium of 2.7 on Hydrochlorothiazide. Recent poor oral intake. Potassium replaced and patient transition to amlodipine 5 mg daily. She had testing for GC and Chlamydia which was negative. Wet prep revealed some clue cells but negative for trichomonas though apparently she was treated with metronidazole. Question of UTI but culture grew out 35,000 colonies which was insignificant. She denies any further abdominal pain or pelvic pain since then. No reported ovarian cyst.  She denies any dysuria at this time. She had some very mild edema on amlodipine but no other side effects. Not monitoring blood pressure.   Review of Systems  Constitutional: Negative for fever, chills, activity change and appetite change.  Respiratory: Negative for cough and shortness of breath.   Cardiovascular: Negative for chest pain.  Gastrointestinal: Negative for abdominal pain and blood in stool.  Genitourinary: Negative for dysuria, hematuria, vaginal bleeding, vaginal discharge and vaginal pain.  Skin: Negative for rash.  Neurological: Negative for dizziness.  Hematological: Negative for adenopathy.       Objective:   Physical Exam  Constitutional: She is oriented to person, place, and time. She appears well-developed and well-nourished. No distress.  HENT:  Right Ear: External ear normal.  Left Ear: External ear normal.  Mouth/Throat: Oropharynx is clear and moist.  Neck: Neck supple.  Cardiovascular: Normal rate, regular rhythm and normal heart sounds.   Pulmonary/Chest: Breath sounds normal. No respiratory distress. She has no wheezes. She has no rales.    Musculoskeletal: She exhibits no edema.  Lymphadenopathy:    She has no cervical adenopathy.  Neurological: She is alert and oriented to person, place, and time.          Assessment & Plan:  #1 hypertension stable. Refilled amlodipine. Avoid HCTZ this time with history of recent hypokalemia #2 recent right lower quadrant pain at resolve. Question of ileus related to hypokalemia. No other explanation for her abdominal pain. Call promptly for curves #3 history of recent bacterial vaginitis. Patient asymptomatic at this time #4 Recent hypokalemia.

## 2010-10-30 NOTE — Discharge Summary (Signed)
Jamie Allison, Jamie Allison              ACCOUNT NO.:  [id number]  MEDICAL RECORD NO.:  [id number]           PATIENT TYPE:  O  LOCATION:  5509                         FACILITY:  [facility]  PHYSICIAN:  Rock Nephew, MD       DATE OF BIRTH:  May 14, 1976  DATE OF ADMISSION:  10/16/2010 DATE OF DISCHARGE:  10/17/2010                        DISCHARGE SUMMARY - REFERRING   PRIMARY CARE PHYSICIAN:  Evelena Peat, MD  DISCHARGE DIAGNOSES: 1. Right lower quadrant abdominal pain, most likely constipation. 2. Hypertension. 3. Gardnerella clue cells and fungal urinary tract infection.  DISCHARGE MEDICATIONS: 1. Amlodipine 5 mg p.o. daily. 2. Acetaminophen 650 mg every 4 hours as needed for pain. 3. Fluconazole 100 mg p.o. daily for 3 days. 4. Colace 100 mg p.o. twice daily.  DISPOSITION:  The patient is discharged home.  DIET:  Heart healthy.  PROCEDURES PERFORMED:  The patient had a CT scan of the abdomen and pelvis with contrast which showed no definite acute intraabdominal or intrapelvic abnormalities.  CONSULTATIONS ON THIS CASE:  None.  FOLLOWUP:  The patient should follow up with Dr. Evelena Peat in 1 week.  BRIEF HISTORY OF PRESENT ILLNESS:  Chief complaint:  Right lower quadrant abdominal pain.  This is a 35 year old female with minimal past medical history significant for hypertension and 3 C-sections who presented today to the emergency room with complaints of severe right lower quadrant abdominal pain.  She also after a few hours of observation in the emergency room started having nausea and vomiting.  She had a CT scan of the abdomen and pelvis without any clear cause for the abdominal pain.  HOSPITAL COURSE: 1. Right lower quadrant abdominal pain.  The patient was placed on     clear liquid diet.  The patient was placed on IV fluids.  By the     time I saw the patient on Oct 17, 2010, the patient was having no     abdominal pain and she wanted to eat a regular diet.  I  am     advancing the patient to a regular diet.  The patient has no     problems eating.  The patient will be discharged home. 2. Hypertension.  The patient's blood pressure has been controlled.     The patient had episodes of hypokalemia.  It is most likely related     to the hydrochlorothiazide.  I have discontinued the     hydrochlorothiazide.  The patient will go home on amlodipine 5 mg     p.o. daily. 3. Gardnerella and fungal UTI.  The patient had evidence of clue cells     on the wet prep.  The patient will get 2 g one time metronidazole     and I will place the patient on fluconazole 100 mg p.o. daily for 3     days.  She had fungus in the urine also. 4. For DVT prophylaxis, the patient received Lovenox.  She should follow up with Bruce Burchette in 1 week.     Rock Nephew, MD     NH/MEDQ  D:  10/17/2010  T:  10/17/2010  Job:  [id number]  cc:   Evelena Peat, M.D.  Electronically Signed by Rock Nephew MD on 10/30/2010 12:31:18 PM

## 2010-11-08 ENCOUNTER — Telehealth: Payer: Self-pay | Admitting: Family Medicine

## 2010-11-08 NOTE — Telephone Encounter (Signed)
Pt called and is req to get a copy of test results from [facility] from 10/15/10, saying results were negative. Pt req to get copy asap. Pls notify when ready for pick up.

## 2010-11-08 NOTE — Telephone Encounter (Signed)
Spoke with pt - labs were done while in hospital - I do see where cultures were run - but not specific for trichomonas - it does not stte that - it looks like it was not indicated. I will print labs for her - but if there are any questions she will need to wait until dr. Caryl Never returns next week. KIK

## 2010-12-18 ENCOUNTER — Inpatient Hospital Stay (INDEPENDENT_AMBULATORY_CARE_PROVIDER_SITE_OTHER)
Admission: RE | Admit: 2010-12-18 | Discharge: 2010-12-18 | Disposition: A | Discharge status: Home / Self Care | Payer: Self-pay | Source: Ambulatory Visit | Attending: Emergency Medicine | Admitting: Emergency Medicine

## 2010-12-18 DIAGNOSIS — S335XXA Sprain of ligaments of lumbar spine, initial encounter: Secondary | ICD-10-CM

## 2010-12-20 ENCOUNTER — Ambulatory Visit (INDEPENDENT_AMBULATORY_CARE_PROVIDER_SITE_OTHER): Payer: BC Managed Care – PPO | Admitting: Family Medicine

## 2010-12-20 ENCOUNTER — Encounter: Payer: Self-pay | Admitting: Family Medicine

## 2010-12-20 VITALS — BP 140/90 | Temp 99.0°F | Wt 211.0 lb

## 2010-12-20 DIAGNOSIS — I10 Essential (primary) hypertension: Secondary | ICD-10-CM

## 2010-12-20 DIAGNOSIS — M545 Low back pain: Secondary | ICD-10-CM

## 2010-12-20 MED ORDER — AMLODIPINE BESYLATE 5 MG PO TABS: 5.0000 mg | ORAL_TABLET | Freq: Every day | ORAL | Status: DC
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
## 2010-12-20 NOTE — Patient Instructions (Signed)
Low Back Sprain with Rehab    A sprain is an injury in which a ligament is torn. The ligaments of the lower back are vulnerable to sprains. However, they are strong and require great force to be injured. These ligaments are important for stabilizing the spinal column. Sprains are classified into three categories. Grade 1 sprains cause pain, but the tendon is not lengthened. Grade 2 sprains include a lengthened ligament, due to the ligament being stretched or partially ruptured. With grade 2 sprains there is still function, although the function may be decreased. Grade 3 sprains involve a complete tear of the tendon or muscle, and function is usually impaired.   SYMPTOMS  Severe pain in the lower back.  Sometimes, a feeling of a "pop," "snap," or tear, at the time of injury.  Tenderness and sometimes swelling at the injury site.  Uncommonly, bruising (contusion) within 48 hours of injury.  Muscle spasms in the back.   CAUSES  Low back sprains occur when a force is placed on the ligaments that is greater than they can handle. Common causes of injury include:  Performing a stressful act while off-balance.  Repetitive stressful activities that involve movement of the lower back.  Direct hit (trauma) to the lower back.   RISK INCREASES WITH   Contact sports (football, wrestling).  Collisions (major skiing accidents).  Sports that require throwing or lifting (baseball, weightlifting).  Sports involving twisting of the spine (gymnastics, diving, tennis, golf).  Poor strength and flexibility.  Inadequate protection.  Previous back injury or surgery (especially fusion).   PREVENTIVE MEASURES   Wear properly fitted and padded protective equipment.  Warm up and stretch properly before activity.  Allow for adequate recovery between workouts.  Maintain physical fitness: l Strength, flexibility, and endurance. l Cardiovascular fitness.  Maintain a healthy body weight.     PROGNOSIS If treated properly, low back sprains usually heal with non-surgical treatment. The length of time for healing depends on the severity of the injury.    POSSIBLE COMPLICATIONS   Recurring symptoms, resulting in a chronic problem.  Chronic inflammation and pain in the low back.  Delayed healing or resolution of symptoms, especially if activity is resumed too soon.  Prolonged impairment.  Unstable or arthritic joints of the low back.   GENERAL TREATMENT CONSIDERATIONS  Treatment first involves the use of ice and medicine, to reduce pain and inflammation. The use of strengthening and stretching exercises may help reduce pain with activity. These exercises may be performed at home or with a therapist. Severe injuries may require referral to a therapist for further evaluation and treatment, such as ultrasound. Your caregiver may advise that you wear a back brace or corset, to help reduce pain and discomfort. Often, prolonged bed rest results in greater harm then benefit. Corticosteroid injections may be recommended. However, these should be reserved for the most serious cases. It is important to avoid using your back when lifting objects. At night, sleep on your back on a firm mattress, with a pillow placed under your knees. If non-surgical treatment is unsuccessful, surgery may be needed.    MEDICATION:   If pain medicine is needed, nonsteroidal anti-inflammatory medicines (aspirin and ibuprofen), or other minor pain relievers (acetaminophen), are often advised.   Do not take pain medicine for 7 days before surgery.   Prescription pain relievers may be given, if your caregiver thinks they are needed. Use only as directed and only as much as you need.  Ointments applied to   the skin may be helpful.  Corticosteroid injections may be given by your caregiver. These injections should be reserved for the most serious cases, because they may only be given a certain number of times.    HEAT AND COLD:   Cold treatment (icing) should be applied for 10 to 15 minutes every 2 to 3 hours for inflammation and pain, and immediately after activity that aggravates your symptoms. Use ice packs or an ice massage.  Heat treatment may be used before performing stretching and strengthening activities prescribed by your caregiver, physical therapist, or athletic trainer. Use a heat pack or a warm water soak.     SEEK MEDICAL CARE IF:   Symptoms get worse or do not improve in 2 to 4 weeks, despite treatment.  You develop numbness or weakness in either leg.  You lose bowel or bladder function.  Any of the following occur after surgery: fever, increased pain, swelling, redness, drainage of fluids, or bleeding in the affected area.  New, unexplained symptoms develop. (Drugs used in treatment may produce side effects.)     EXERCISES   RANGE OF MOTION AND STRETCHING EXERCISES - Low Back Sprain Most people with lower back pain will find that their symptoms get worse with excessive bending forward (flexion) or arching at the lower back (extension). The exercises that will help resolve your symptoms will focus on the opposite motion.    Your physician, physical therapist or athletic trainer will help you determine which exercises will be most helpful to resolve your lower back pain. Do not complete any exercises without first consulting with your caregiver. Discontinue any exercises which make your symptoms worse, until you speak to your caregiver.    If you have pain, numbness or tingling which travels down into your buttocks, leg or foot, the goal of the therapy is for these symptoms to move closer to your back and eventually resolve. Sometimes, these leg symptoms will get better, but your lower back pain may worsen. This is often an indication of progress in your rehabilitation. Be very alert to any changes in your symptoms and the activities in which you participated in the 24 hours prior  to the change. Sharing this information with your caregiver will allow him or her to most efficiently treat your condition.   These exercises may help you when beginning to rehabilitate your injury. Your symptoms may resolve with or without further involvement from your physician, physical therapist or athletic trainer. While completing these exercises, remember:   Restoring tissue flexibility helps normal motion to return to the joints. This allows healthier, less painful movement and activity.  An effective stretch should be held for at least 30 seconds.  A stretch should never be painful. You should only feel a gentle lengthening or release in the stretched tissue.      FLEXION RANGE OF MOTION AND STRETCHING EXERCISES:  STRETCH - Flexion, Single Knee to Chest   Lie on a firm bed or floor with both legs extended in front of you.  Keeping one leg in contact with the floor, bring your opposite knee to your chest. Hold your leg in place by either grabbing behind your thigh or at your knee.  Pull until you feel a gentle stretch in your low back. Hold __________ seconds.  Slowly release your grasp and repeat the exercise with the opposite side. Repeat __________ times. Complete this exercise __________ times per day.     STRETCH - Flexion, Double Knee to Chest     Lie on a firm bed or floor with both legs extended in front of you.  Keeping one leg in contact with the floor, bring your opposite knee to your chest.    Tense your stomach muscles to support your back and then lift your other knee to your chest. Hold your legs in place by either grabbing behind your thighs or at your knees.  Pull both knees toward your chest until you feel a gentle stretch in your low back. Hold __________ seconds.  Tense your stomach muscles and slowly return one leg at a time to the floor. Repeat __________ times. Complete this exercise __________ times per day.     STRETCH - Low Trunk Rotation   Lie  on a firm bed or floor. Keeping your legs in front of you, bend your knees so they are both pointed toward the ceiling and your feet are flat on the floor.  Extend your arms out to the side. This will stabilize your upper body by keeping your shoulders in contact with the floor.  Gently and slowly drop both knees together to one side until you feel a gentle stretch in your low back. Hold for __________ seconds.   Tense your stomach muscles to support your lower back as you bring your knees back to the starting position. Repeat the exercise to the other side. Repeat __________ times. Complete this exercise __________ times per day      EXTENSION RANGE OF MOTION AND FLEXIBILITY EXERCISES:  STRETCH - Extension, Prone on Elbows   Lie on your stomach on the floor, a bed will be too soft. Place your palms about shoulder width apart and at the height of your head.  Place your elbows under your shoulders. If this is too painful, stack pillows under your chest.  Allow your body to relax so that your hips drop lower and make contact more completely with the floor.  Hold this position for __________ seconds.  Slowly return to lying flat on the floor. Repeat __________ times. Complete this exercise __________ times per day.     RANGE OF MOTION - Extension, Prone Press Ups   Lie on your stomach on the floor, a bed will be too soft. Place your palms about shoulder width apart and at the height of your head.  Keeping your back as relaxed as possible, slowly straighten your elbows while keeping your hips on the floor. You may adjust the placement of your hands to maximize your comfort. As you gain motion, your hands will come more underneath your shoulders.  Hold this position __________ seconds.  Slowly return to lying flat on the floor. Repeat __________ times. Complete this exercise __________ times per day.     RANGE OF MOTION- Quadruped, Neutral Spine   Assume a hands and knees position  on a firm surface. Keep your hands under your shoulders and your knees under your hips. You may place padding under your knees for comfort.    Drop your head and point your tailbone toward the ground below you. This will round out your lower back like an angry cat. Hold this position for __________ seconds.   Slowly lift your head and release your tail bone so that your back sags into a large arch, like an old horse.  Hold this position for __________ seconds.   Repeat this until you feel limber in your low back.  Now, find your "sweet spot." This will be the most comfortable position somewhere between the two previous   positions. This is your neutral spine. Once you have found this position, tense your stomach muscles to support your low back.  Hold this position for __________ seconds. Repeat __________ times. Complete this exercise __________ times per day.      STRENGTHENING EXERCISES - Low Back Sprain These exercises may help you when beginning to rehabilitate your injury. These exercises should be done near your "sweet spot." This is the neutral, low-back arch, somewhere between fully rounded and fully arched, that is your least painful position. When performed in this safe range of motion, these exercises can be used for people who have either a flexion or extension based injury. These exercises may resolve your symptoms with or without further involvement from your physician, physical therapist or athletic trainer. While completing these exercises, remember:   Muscles can gain both the endurance and the strength needed for everyday activities through controlled exercises.  Complete these exercises as instructed by your physician, physical therapist or athletic trainer. Increase the resistance and repetitions only as guided.  You may experience muscle soreness or fatigue, but the pain or discomfort you are trying to eliminate should never worsen during these exercises. If this pain does  worsen, stop and make certain you are following the directions exactly. If the pain is still present after adjustments, discontinue the exercise until you can discuss the trouble with your caregiver.     STRENGTHENING - Deep Abdominals, Pelvic Tilt   Lie on a firm bed or floor. Keeping your legs in front of you, bend your knees so they are both pointed toward the ceiling and your feet are flat on the floor.  Tense your lower abdominal muscles to press your low back into the floor.  This motion will rotate your pelvis so that your tail bone is scooping upwards rather than pointing at your feet or into the floor. With a gentle tension and even breathing, hold this position for __________ seconds. Repeat __________ times. Complete this exercise __________ times per day.      STRENGTHENING - Abdominals, Crunches   Lie on a firm bed or floor. Keeping your legs in front of you, bend your knees so they are both pointed toward the ceiling and your feet are flat on the floor. Cross your arms over your chest.    Slightly tip your chin down without bending your neck.  Tense your abdominals and slowly lift your trunk high enough to just clear your shoulder blades. Lifting higher can put excessive stress on the lower back and does not further strengthen your abdominal muscles.  Control your return to the starting position. Repeat __________ times. Complete this exercise __________ times per day.     STRENGTHENING - Quadruped, Opposite UE/LE Lift   Assume a hands and knees position on a firm surface. Keep your hands under your shoulders and your knees under your hips. You may place padding under your knees for comfort.    Find your neutral spine and gently tense your abdominal muscles so that you can maintain this position. Your shoulders and hips should form a rectangle that is parallel with the floor and is not twisted.   Keeping your trunk steady, lift your right hand no higher than your shoulder  and then your left leg no higher than your hip. Make sure you are not holding your breath. Hold this position for __________ seconds.  Continuing to keep your abdominal muscles tense and your back steady, slowly return to your starting position. Repeat with the   opposite arm and leg. Repeat __________ times. Complete this exercise __________ times per day.      STRENGTHENING - Abdominals and Quadriceps, Straight Leg Raise   Lie on a firm bed or floor with both legs extended in front of you.  Keeping one leg in contact with the floor, bend the other knee so that your foot can rest flat on the floor.  Find your neutral spine, and tense your abdominal muscles to maintain your spinal position throughout the exercise.  Slowly lift your straight leg off the floor about 6 inches for a count of 15, making sure to not hold your breath.  Still keeping your neutral spine, slowly lower your leg all the way to the floor.   Repeat this exercise with each leg __________ times. Complete this exercise __________ times per day.     POSTURE AND BODY MECHANICS CONSIDERATIONS - Low Back Sprain Keeping correct posture when sitting, standing or completing your activities will reduce the stress put on different body tissues, allowing injured tissues a chance to heal and limiting painful experiences. The following are general guidelines for improved posture. Your physician or physical therapist will provide you with any instructions specific to your needs. While reading these guidelines, remember:  The exercises prescribed by your provider will help you have the flexibility and strength to maintain correct postures.  The correct posture provides the best environment for your joints to work. All of your joints have less wear and tear when properly supported by a spine with good posture. This means you will experience a healthier, less painful body.  Correct posture must be practiced with all of your activities,  especially prolonged sitting and standing. Correct posture is as important when doing repetitive low-stress activities (typing) as it is when doing a single heavy-load activity (lifting).     RESTING POSITIONS Consider which positions are most painful for you when choosing a resting position. If you have pain with flexion-based activities (sitting, bending, stooping, squatting), choose a position that allows you to rest in a less flexed posture. You would want to avoid curling into a fetal position on your side. If your pain worsens with extension-based activities (prolonged standing, working overhead), avoid resting in an extended position such as sleeping on your stomach. Most people will find more comfort when they rest with their spine in a more neutral position, neither too rounded nor too arched. Lying on a non-sagging bed on your side with a pillow between your knees, or on your back with a pillow under your knees will often provide some relief.  Keep in mind, being in any one position for a prolonged period of time, no matter how correct your posture, can still lead to stiffness.    PROPER SITTING POSTURE In order to minimize stress and discomfort on your spine, you must sit with correct posture. Sitting with good posture should be effortless for a healthy body. Returning to good posture is a gradual process. Many people can work toward this most comfortably by using various supports until they have the flexibility and strength to maintain this posture on their own.   When sitting with proper posture, your ears will fall over your shoulders and your shoulders will fall over your hips. You should use the back of the chair to support your upper back. Your lower back will be in a neutral position, just slightly arched. You may place a small pillow or folded towel at the base of your lower back for    support.    When working at a desk, create an environment that supports good, upright posture.  Without extra support, muscles tire, which leads to excessive strain on joints and other tissues. Keep these recommendations in mind:   CHAIR:    A chair should be able to slide under your desk when your back makes contact with the back of the chair. This allows you to work closely.  The chair's height should allow your eyes to be level with the upper part of your monitor and your hands to be slightly lower than your elbows.      BODY POSITION  Your feet should make contact with the floor. If this is not possible, use a foot rest.  Keep your ears over your shoulders. This will reduce stress on your neck and low back.     INCORRECT SITTING POSTURES  If you are feeling tired and unable to assume a healthy sitting posture, do not slouch or slump. This puts excessive strain on your back tissues, causing more damage and pain. Healthier options include:  Using more support, like a lumbar pillow.  Switching tasks to something that requires you to be upright or walking.  Talking a brief walk.  Lying down to rest in a neutral-spine position.      PROLONGED STANDING WHILE SLIGHTLY LEANING FORWARD  When completing a task that requires you to lean forward while standing in one place for a long time, place either foot up on a stationary 2-4 inch high object to help maintain the best posture. When both feet are on the ground, the lower back tends to lose its slight inward curve. If this curve flattens (or becomes too large), then the back and your other joints will experience too much stress, tire more quickly, and can cause pain.       CORRECT STANDING POSTURES Proper standing posture should be assumed with all daily activities, even if they only take a few moments, like when brushing your teeth. As in sitting, your ears should fall over your shoulders and your shoulders should fall over your hips. You should keep a slight tension in your abdominal muscles to brace your spine. Your tailbone  should point down to the ground, not behind your body, resulting in an over-extended swayback posture.      INCORRECT STANDING POSTURES  Common incorrect standing postures include a forward head, locked knees and/or an excessive swayback.     WALKING Walk with an upright posture. Your ears, shoulders and hips should all line-up.     PROLONGED ACTIVITY IN A FLEXED POSITION When completing a task that requires you to bend forward at your waist or lean over a low surface, try to find a way to stabilize 3 out of 4 of your limbs. You can place a hand or elbow on your thigh or rest a knee on the surface you are reaching across. This will provide you more stability, so that your muscles do not tire as quickly. By keeping your knees relaxed, or slightly bent, you will also reduce stress across your lower back.     CORRECT LIFTING TECHNIQUES DO :   Assume a wide stance. This will provide you more stability and the opportunity to get as close as possible to the object which you are lifting.  Tense your abdominals to brace your spine. Bend at the knees and hips. Keeping your back locked in a neutral-spine position, lift using your leg muscles. Lift with your legs, keeping your   back straight.  Test the weight of unknown objects before attempting to lift them.  Try to keep your elbows locked down at your sides in order get the best strength from your shoulders when carrying an object.  Always ask for help when lifting heavy or awkward objects.    INCORRECT LIFTING TECHNIQUES DO NOT:   Lock your knees when lifting, even if it is a small object.  Bend and twist. Pivot at your feet or move your feet when needing to change directions.  Assume that you can safely pick up even a paperclip without proper posture.   Document Released: 05/06/2005  Document Re-Released: 03/03/2009 [employer] Patient Information 2011 [employer] 

## 2010-12-25 ENCOUNTER — Encounter: Payer: Self-pay | Admitting: Family Medicine

## 2010-12-25 ENCOUNTER — Ambulatory Visit (INDEPENDENT_AMBULATORY_CARE_PROVIDER_SITE_OTHER): Payer: Self-pay | Admitting: Family Medicine

## 2010-12-25 VITALS — BP 140/98 | Temp 98.2°F | Wt 217.0 lb

## 2010-12-25 DIAGNOSIS — M545 Low back pain: Secondary | ICD-10-CM

## 2010-12-25 MED ORDER — CYCLOBENZAPRINE HCL 10 MG PO TABS: 10.0000 mg | ORAL_TABLET | Freq: Three times a day (TID) | ORAL | Status: DC | PRN
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
## 2010-12-25 NOTE — Patient Instructions (Signed)
Be in touch if pain not further improved by next week. Avoid any heavy lifting or frequent bending at waist Walk as much as possible.

## 2011-01-01 ENCOUNTER — Telehealth: Payer: Self-pay | Admitting: *Deleted

## 2011-01-01 NOTE — Telephone Encounter (Signed)
completed

## 2011-01-01 NOTE — Telephone Encounter (Signed)
Pt here with a form needing to be filled out before she can go back to work

## 2011-01-30 ENCOUNTER — Other Ambulatory Visit: Payer: Self-pay | Admitting: Family Medicine

## 2011-02-05 ENCOUNTER — Ambulatory Visit (INDEPENDENT_AMBULATORY_CARE_PROVIDER_SITE_OTHER): Payer: BC Managed Care – PPO | Admitting: Family Medicine

## 2011-02-05 DIAGNOSIS — J3489 Other specified disorders of nose and nasal sinuses: Secondary | ICD-10-CM

## 2011-02-05 DIAGNOSIS — R1110 Vomiting, unspecified: Secondary | ICD-10-CM

## 2011-02-05 DIAGNOSIS — R51 Headache: Secondary | ICD-10-CM

## 2011-02-05 DIAGNOSIS — R062 Wheezing: Secondary | ICD-10-CM

## 2011-02-05 MED ORDER — METHYLPREDNISOLONE ACETATE 80 MG/ML IJ SUSP
80.0000 mg | Freq: Once | INTRAMUSCULAR | Status: AC
  Administered 2011-02-05: 80 mg via INTRAMUSCULAR

## 2011-02-08 LAB — URINALYSIS, ROUTINE W REFLEX MICROSCOPIC
Bilirubin Urine: NEGATIVE
Glucose, UA: NEGATIVE
Protein, ur: NEGATIVE

## 2011-02-08 LAB — URINE MICROSCOPIC-ADD ON

## 2011-02-08 LAB — PREGNANCY, URINE: Preg Test, Ur: NEGATIVE

## 2011-02-11 LAB — WET PREP, GENITAL
WBC, Wet Prep HPF POC: NONE SEEN
Yeast Wet Prep HPF POC: NONE SEEN

## 2011-02-11 LAB — GC/CHLAMYDIA PROBE AMP, GENITAL
Chlamydia, DNA Probe: NEGATIVE
GC Probe Amp, Genital: NEGATIVE

## 2011-02-11 LAB — POCT PREGNANCY, URINE
Operator id: 272551
Preg Test, Ur: NEGATIVE

## 2011-02-14 LAB — COMPREHENSIVE METABOLIC PANEL
AST: 15
CO2: 23
Chloride: 108
Creatinine, Ser: 0.66
GFR calc Af Amer: >60
GFR calc non Af Amer: >60
Glucose, Bld: 91
Total Bilirubin: 0.6

## 2011-02-14 LAB — DIFFERENTIAL
Basophils Absolute: 0
Eosinophils Absolute: 0.1
Eosinophils Relative: 2
Lymphocytes Relative: 39

## 2011-02-14 LAB — CBC
HCT: 35.5 — ABNORMAL LOW
Hemoglobin: 11.9 — ABNORMAL LOW
MCV: 85.2
RBC: 4.17
WBC: 5.6

## 2011-02-14 LAB — POCT CARDIAC MARKERS
Operator id: 288831
Troponin i, poc: <0.05
Troponin i, poc: <0.05

## 2011-02-15 LAB — HEPATIC FUNCTION PANEL
ALT: 13
AST: 22
Albumin: 4.9
Alkaline Phosphatase: 62
Bilirubin, Direct: 0.1
Indirect Bilirubin: 0.6
Total Bilirubin: 0.7
Total Protein: 8.8 — ABNORMAL HIGH

## 2011-02-15 LAB — URINE MICROSCOPIC-ADD ON

## 2011-02-15 LAB — URINALYSIS, ROUTINE W REFLEX MICROSCOPIC
Bilirubin Urine: NEGATIVE
Glucose, UA: NEGATIVE
Ketones, ur: NEGATIVE
Leukocytes, UA: NEGATIVE
Nitrite: NEGATIVE
Protein, ur: >300 — AB
Specific Gravity, Urine: 1.024
Urobilinogen, UA: 0.2
pH: 6

## 2011-02-15 LAB — RAPID URINE DRUG SCREEN, HOSP PERFORMED
Amphetamines: NOT DETECTED
Barbiturates: NOT DETECTED
Benzodiazepines: NOT DETECTED
Cocaine: NOT DETECTED
Opiates: NOT DETECTED
Tetrahydrocannabinol: POSITIVE — AB

## 2011-02-15 LAB — POCT I-STAT, CHEM 8
BUN: 11
Calcium, Ion: 1.13
Chloride: 109
Creatinine, Ser: 0.7
Glucose, Bld: 115 — ABNORMAL HIGH
HCT: 43
Hemoglobin: 14.6
Potassium: 3.7
Sodium: 146 — ABNORMAL HIGH
TCO2: 25

## 2011-02-15 LAB — DIFFERENTIAL
Lymphocytes Relative: 12
Lymphs Abs: 1.2
Neutro Abs: 8.7 — ABNORMAL HIGH
Neutrophils Relative %: 85 — ABNORMAL HIGH

## 2011-02-15 LAB — POCT PREGNANCY, URINE
Operator id: 285491
Preg Test, Ur: NEGATIVE

## 2011-02-15 LAB — CBC
Platelets: 389
WBC: 10.2

## 2011-02-15 LAB — ETHANOL: Alcohol, Ethyl (B): <5

## 2011-02-28 LAB — URINALYSIS, ROUTINE W REFLEX MICROSCOPIC
Glucose, UA: NEGATIVE
Ketones, ur: NEGATIVE
Leukocytes, UA: NEGATIVE
Nitrite: NEGATIVE
Specific Gravity, Urine: 1.019
pH: 6

## 2011-02-28 LAB — URINE MICROSCOPIC-ADD ON

## 2011-03-04 LAB — COMPREHENSIVE METABOLIC PANEL WITH GFR
ALT: 11
AST: 15
Albumin: 3.6
Alkaline Phosphatase: 41
BUN: 6
CO2: 26
Calcium: 8.9
Chloride: 103
Creatinine, Ser: 0.68
GFR calc non Af Amer: >60
Glucose, Bld: 96
Potassium: 3.5
Sodium: 134 — ABNORMAL LOW
Total Bilirubin: 0.4
Total Protein: 6.7

## 2011-03-04 LAB — DIFFERENTIAL
Basophils Absolute: 0
Basophils Relative: 1
Eosinophils Absolute: 0.1
Eosinophils Relative: 2
Lymphocytes Relative: 43
Lymphs Abs: 2.7
Monocytes Absolute: 0.5
Monocytes Relative: 7
Neutro Abs: 3
Neutrophils Relative %: 47

## 2011-03-04 LAB — CBC
HCT: 34.3 — ABNORMAL LOW
Hemoglobin: 11.5 — ABNORMAL LOW
MCHC: 33.5
MCV: 86.2
Platelets: 348
RBC: 3.98
RDW: 13.4
WBC: 6.3

## 2011-03-04 LAB — LIPASE, BLOOD: Lipase: 22

## 2011-03-05 ENCOUNTER — Other Ambulatory Visit: Payer: Self-pay | Admitting: Family Medicine

## 2011-03-05 ENCOUNTER — Encounter: Payer: Self-pay | Admitting: Family Medicine

## 2011-03-05 ENCOUNTER — Ambulatory Visit (INDEPENDENT_AMBULATORY_CARE_PROVIDER_SITE_OTHER): Payer: Self-pay | Admitting: Family Medicine

## 2011-03-05 DIAGNOSIS — Z113 Encounter for screening for infections with a predominantly sexual mode of transmission: Secondary | ICD-10-CM

## 2011-03-05 DIAGNOSIS — M549 Dorsalgia, unspecified: Secondary | ICD-10-CM

## 2011-03-05 DIAGNOSIS — R300 Dysuria: Secondary | ICD-10-CM

## 2011-03-05 LAB — POCT URINALYSIS DIPSTICK
Bilirubin, UA: NEGATIVE
Glucose, UA: NEGATIVE
Ketones, UA: NEGATIVE
Leukocytes, UA: NEGATIVE
Nitrite, UA: NEGATIVE
pH, UA: 7.5

## 2011-03-05 LAB — RPR

## 2011-03-05 NOTE — Progress Notes (Signed)
  Subjective:    Patient ID: Jamie Allison, female    DOB: 25-May-1975, 35 y.o.   MRN: [id number]  HPI Patient seen with vague low back pain past several days. No injury.  Job does require a lot of lifting. Patient also relates some suprapubic pressure with urination. No burning. Some heavy mucoid vaginal discharge. New sexual contact 2 weeks ago. No known exposure to STD. Requesting STD screening. Prior history of herpes. Also had prior history of trichomonas. Last menstrual period about 2 weeks ago and normal. Denies fever or chills.  Back pain worse with lifting and back flexion.  No relief with heat.  No radiculopathy symptoms.  Moderate severity.   Review of Systems  Constitutional: Negative for fever, chills and unexpected weight change.  Respiratory: Negative for shortness of breath.   Cardiovascular: Negative for chest pain.  Gastrointestinal: Negative for abdominal pain.  Genitourinary: Positive for vaginal discharge. Negative for vaginal bleeding and genital sores.  Musculoskeletal: Positive for back pain.  Skin: Negative for rash.  Neurological: Negative for weakness and numbness.  Hematological: Negative for adenopathy.       Objective:   Physical Exam  Constitutional: She appears well-developed and well-nourished.  Cardiovascular: Normal rate, regular rhythm and normal heart sounds.   Pulmonary/Chest: Effort normal and breath sounds normal. No respiratory distress. She has no wheezes. She has no rales.  Genitourinary: Vagina normal.       Minimal white mucoid vaginal discharge. No skin lesions. Wet prep obtained.  Musculoskeletal: She exhibits no edema.       SLRs negative.  Neurological:       Full strength lower extremities.  DTRs symmetric.  Normal sensation.          Assessment & Plan:  #1 low back pain. Urine dipstick essentially unremarkable. No evidence for UTI.  Suspect musculoskeletal.  NSAID, heat , and muscle relaxer. #2 vaginal discharge. Wet prep  obtained. Rule out trichomonas, etc. #3 STD screening. Check RPR and HIV. GC and Chlamydia urine probe sent

## 2011-03-06 LAB — WET PREP BY MOLECULAR PROBE: Candida species: NEGATIVE

## 2011-03-06 LAB — HIV ANTIBODY (ROUTINE TESTING W REFLEX): HIV: NONREACTIVE

## 2011-03-06 LAB — GC/CHLAMYDIA PROBE AMP, URINE: GC Probe Amp, Urine: NEGATIVE

## 2011-03-06 NOTE — Progress Notes (Signed)
Quick Note:  Pt informed ______ 

## 2011-03-07 NOTE — Progress Notes (Signed)
Quick Note:  Pt informed ______ 

## 2011-03-22 NOTE — Progress Notes (Signed)
System Downtime Recovery The EMR experienced a system downtime.  This downtime occurred on 02-05-2011. During this downtime paper charting was completed by the provider.  The visit was documented on paper during the downtime and will be scanned into [facility], billing was completed by the [facility] Billing Department .  The visit is being closed on behalf of the provider. 

## 2011-05-24 ENCOUNTER — Encounter: Payer: Self-pay | Admitting: Family Medicine

## 2011-05-24 ENCOUNTER — Ambulatory Visit (INDEPENDENT_AMBULATORY_CARE_PROVIDER_SITE_OTHER): Payer: BC Managed Care – PPO | Admitting: Family Medicine

## 2011-05-24 VITALS — BP 130/80 | Temp 98.8°F | Wt 212.0 lb

## 2011-05-24 DIAGNOSIS — G44209 Tension-type headache, unspecified, not intractable: Secondary | ICD-10-CM

## 2011-05-24 DIAGNOSIS — R5383 Other fatigue: Secondary | ICD-10-CM

## 2011-05-24 DIAGNOSIS — R0609 Other forms of dyspnea: Secondary | ICD-10-CM

## 2011-05-24 DIAGNOSIS — R0683 Snoring: Secondary | ICD-10-CM

## 2011-05-24 MED ORDER — CYCLOBENZAPRINE HCL 5 MG PO TABS: 5.0000 mg | ORAL_TABLET | Freq: Three times a day (TID) | ORAL | Status: AC | PRN

## 2011-05-24 NOTE — Progress Notes (Signed)
  Subjective:    Patient ID: Jamie Allison, female    DOB: 1975-06-09, 36 y.o.   MRN: [id number]  HPI  Patient is seen with a headache. Started 2 days ago. Bandlike distribution bifrontal. Dull achy quality. Moderate severity. Denies any nasal congestion or sinus discharge. No fever or chills. Ibuprofen without much relief. Generally poor sleep quality past few days. Denies stiff neck. No focal neurologic symptoms.  Patient relates a separate issue of frequent snoring and reported observed apnea episodes. Increased daytime fatigue. Minimal daytime somnolence. Concern for obstructive sleep apnea.  Sometimes wakes with a dry mouth. Not currently exercising with any regularity.  Past Medical History  Diagnosis Date  . CHLAMYDIAL INFECTION 11/29/2009  . 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 DEPRESSION 10/20/2008  . HYPERTENSION 10/20/2008  . Polyuria 02/15/2010  . LUMBAR STRAIN, ACUTE 08/17/2009  . Sickle cell anemia     trait  . Herpes   . Headache    Past Surgical History  Procedure Date  . Cesarean section     X 3  . Tubal ligation     reports that she has never smoked. She does not have any smokeless tobacco history on file. Her alcohol and drug histories not on file. family history includes Arthritis in her other; Depression in her other; and Hypertension in her other. No Known Allergies    Review of Systems  Constitutional: Positive for fatigue. Negative for appetite change and unexpected weight change.  Eyes: Negative for visual disturbance.  Respiratory: Negative for cough and shortness of breath.   Cardiovascular: Negative for chest pain, palpitations and leg swelling.  Gastrointestinal: Negative for abdominal pain.  Musculoskeletal: Negative for back pain.  Neurological: Negative for dizziness, syncope, weakness, numbness and headaches.  Psychiatric/Behavioral: Negative for dysphoric mood.       Objective:   Physical Exam  Constitutional: She is oriented to person, place,  and time. She appears well-developed and well-nourished. No distress.  HENT:  Mouth/Throat: Oropharynx is clear and moist.  Neck: Neck supple. No thyromegaly present.  Cardiovascular: Normal rate and regular rhythm.   Pulmonary/Chest: Effort normal and breath sounds normal. No respiratory distress. She has no wheezes. She has no rales.  Musculoskeletal: She exhibits no edema.  Lymphadenopathy:    She has no cervical adenopathy.  Neurological: She is alert and oriented to person, place, and time. No cranial nerve deficit.       No focal weakness. Gait normal.          Assessment & Plan:  #1 acute episodic tension type headache. Continue ibuprofen as needed. Flexeril 5 mg each bedtime when necessary.  Try moist heat and muscle massage.  #2 possible obstructive sleep apnea. Schedule split study.  Discussed need to work on weight loss

## 2011-05-27 ENCOUNTER — Ambulatory Visit (HOSPITAL_BASED_OUTPATIENT_CLINIC_OR_DEPARTMENT_OTHER): Payer: BC Managed Care – PPO | Attending: Family Medicine | Admitting: Radiology

## 2011-05-27 VITALS — Ht 64.0 in | Wt 209.0 lb

## 2011-05-27 DIAGNOSIS — R0683 Snoring: Secondary | ICD-10-CM

## 2011-05-27 DIAGNOSIS — G4733 Obstructive sleep apnea (adult) (pediatric): Secondary | ICD-10-CM

## 2011-06-06 DIAGNOSIS — G4733 Obstructive sleep apnea (adult) (pediatric): Secondary | ICD-10-CM

## 2011-06-06 NOTE — Procedures (Signed)
Jamie Allison, Jamie Allison              ACCOUNT NO.:  [id number]  MEDICAL RECORD NO.:  [id number]          PATIENT TYPE:  OUT  LOCATION:  SLEEP CENTER                 FACILITY:  [facility]  PHYSICIAN:  Barbaraann Share, MD,FCCPDATE OF BIRTH:  1976/11/26  DATE OF STUDY:  05/27/2011                           NOCTURNAL POLYSOMNOGRAM  REFERRING PHYSICIAN:  Evelena Peat, M.D.  INDICATION FOR STUDY:  Hypersomnia with sleep apnea.  EPWORTH SLEEPINESS SCORE:  Epworth score: 9.  MEDICATIONS:  SLEEP ARCHITECTURE:  The patient had a total sleep time of 324 minutes with very little slow-wave sleep and also decreased quantity of REM. Sleep onset latency was at the upper limits of normal at 31 minute and REM onset was normal at 111 minute.  Sleep efficiency was moderately reduced during the diagnostic portion of the study at 76% and improved mildly to 83% during the titration portion of the study.  RESPIRATORY DATA:  The patient underwent a split night protocol, where she was found to have 70 obstructive events in the first 173 minutes of sleep.  This gave her an apnea-hypopnea index during the diagnostic portion of 24 events per hour.  The events occurred in all body positions but were increased in the supine position, and there was loud snoring noted throughout.  By protocol the patient was then fitted with a small ResMed Quattro full-face mask, and CPAP titration was initiated. At a final pressure of 11 CWP, the patient appeared to have adequate control of her obstructive events.  OXYGEN DATA:  There was O2 desaturation as low as 86% with the patient's obstructive events.  CARDIAC DATA:  No clinically significant arrhythmias were noted.  MOVEMENT-PARASOMNIA:  The patient had no significant leg jerks or other abnormal behavior seen.  IMPRESSIONS-RECOMMENDATIONS:  Split night study reveals moderate obstructive sleep apnea with an AHI of 24 events per hour during the diagnostic portion of the  study.  The patient was then fitted with a small ResMed Quattro full-face mask, and found to have an optimal CPAP pressure of 11 CWP.  She should also be encouraged to work aggressively on weight loss.     Barbaraann Share, MD,FCCP Diplomate, American Board of Sleep Medicine Electronically Signed    [facility]  D:  06/06/2011 08:22:09  T:  06/06/2011 09:43:33  Job:  [id number]

## 2011-08-20 ENCOUNTER — Ambulatory Visit (INDEPENDENT_AMBULATORY_CARE_PROVIDER_SITE_OTHER): Payer: BC Managed Care – PPO | Admitting: Family

## 2011-08-20 ENCOUNTER — Encounter: Payer: Self-pay | Admitting: Family

## 2011-08-20 VITALS — BP 162/94 | HR 95 | Temp 98.9°F | Wt 215.0 lb

## 2011-08-20 DIAGNOSIS — G44209 Tension-type headache, unspecified, not intractable: Secondary | ICD-10-CM

## 2011-08-20 MED ORDER — KETOROLAC TROMETHAMINE 60 MG/2ML IM SOLN: 60.0000 mg | Freq: Once | INTRAMUSCULAR | Status: AC

## 2011-08-20 MED ORDER — TRAMADOL HCL 50 MG PO TABS: 50.0000 mg | ORAL_TABLET | Freq: Three times a day (TID) | ORAL | Status: AC | PRN

## 2011-08-20 MED ORDER — KETOROLAC TROMETHAMINE 60 MG/2ML IM SOLN
60.0000 mg | Freq: Once | INTRAMUSCULAR | Status: AC
  Administered 2011-08-20: 60 mg via INTRAMUSCULAR

## 2011-08-20 NOTE — Patient Instructions (Signed)
Tension Headache (Muscle Contraction Headache) Tension headache is one of the most common causes of head pain. These headaches are usually felt as a pain over the top of your head and back of your neck. Stress, anxiety, and depression are common triggers for these headaches. Tension headaches are not life-threatening and will not lead to other types of headaches. Tension headaches can often be diagnosed by taking a history from the patient and a physical exam. Sometimes, further lab and x-ray studies are used to confirm the diagnosis. Your caregiver can advise you on how to get help solving problems that cause anxiety or stress. Antidepressants can be prescribed if depression is a problem. HOME CARE INSTRUCTIONS   If testing was done, call for your results. Remember, it is your responsibility to get the results of all testing. Do not assume everything is fine because you do not hear from your caregiver.   Only take over-the-counter or prescription medicines for pain, discomfort, or fever as directed by your caregiver.   Biofeedback, massage, or other relaxation techniques may be helpful.   Ice packs or heat to the head and neck can be used. Use these three to four times per day or as needed.   Physical therapy may be a useful addition to treatment.   If headaches continue, even with therapy, you may need to think about lifestyle changes.   Avoid excessive use of pain killers, as rebound headaches can occur.  SEEK MEDICAL CARE IF:   You develop problems with medications prescribed.   You do not respond or get no relief from medications.   You have a change from the usual headache.   You develop nausea (feeling sick to your stomach) or vomiting.  SEEK IMMEDIATE MEDICAL CARE IF:   Your headache becomes severe.   You have an unexplained oral temperature above 102 F (38.9 C).   You develop a stiff neck.   You have loss of vision.   You have muscular weakness.   You have loss of  muscular control.   You develop severe symptoms different from your first symptoms.   You start losing your balance or have trouble walking.   You feel faint or pass out.  MAKE SURE YOU:   Understand these instructions.   Will watch your condition.   Will get help right away if you are not doing well or get worse.  Document Released: 05/06/2005 Document Revised: 04/25/2011 Document Reviewed: 12/24/2007 ExitCare Patient Information 2012 [employer]. 

## 2011-08-20 NOTE — Progress Notes (Signed)
Subjective:    Patient ID: Jamie Allison, female    DOB: 1975-09-10, 36 y.o.   MRN: [id number]  HPI 36 year old [place] American female, nonsmoker, patient of Dr. Caryl Never presents today with complaints of a headache. The headache is in the frontal area, and has been present for 2 days. She has been out of work for 2 days. She denies any nausea or vomiting, sensitivity to light or noise. Reports dizziness going from a sitting to a standing position. She currently takes Norvasc 5 mg a day for her blood pressure but has been out of her medication for 2 days. Patient denies any shortness of breath, chest pain, nausea, vomiting, diarrhea or constipation, no edema.   Review of Systems  Constitutional: Negative.   Eyes: Negative.   Respiratory: Negative.   Cardiovascular: Negative.   Gastrointestinal: Negative.  Negative for nausea and vomiting.  Musculoskeletal: Negative.   Skin: Negative.   Neurological: Positive for light-headedness and headaches. Negative for syncope, speech difficulty, weakness and numbness.  Hematological: Negative.    Past Medical History  Diagnosis Date  . CHLAMYDIAL INFECTION 11/29/2009  . 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 DEPRESSION 10/20/2008  . HYPERTENSION 10/20/2008  . Polyuria 02/15/2010  . LUMBAR STRAIN, ACUTE 08/17/2009  . Sickle cell anemia     trait  . Herpes   . Headache     History   Social History  . Marital Status: Single    Spouse Name: N/A    Number of Children: N/A  . Years of Education: N/A   Occupational History  . Not on file.   Social History Main Topics  . Smoking status: Never Smoker   . Smokeless tobacco: Never Used  . Alcohol Use: No  . Drug Use: No  . Sexually Active: Not on file   Other Topics Concern  . Not on file   Social History Narrative  . No narrative on file    Past Surgical History  Procedure Date  . Cesarean section     X 3  . Tubal ligation     Family History  Problem Relation Age of Onset  .  Arthritis Other   . Hypertension Other   . Depression Other     No Known Allergies  Current Outpatient Prescriptions on File Prior to Visit  Medication Sig Dispense Refill  . amLODipine (NORVASC) 5 MG tablet Take 1 tablet (5 mg total) by mouth daily.  30 tablet  11  . nystatin (MYCOSTATIN) cream APPLY  CREAM TO AFFECTED AREA TWICE DAILY AS NEEDED  30 g  0   No current facility-administered medications on file prior to visit.    BP 162/94  Pulse 95  Temp 98.9 F (37.2 C)  Wt 215 lb (97.523 kg)  SpO2 99%chart    Objective:   Physical Exam  Constitutional: She is oriented to person, place, and time. She appears well-developed and well-nourished.  HENT:  Right Ear: External ear normal.  Left Ear: External ear normal.  Nose: Nose normal.  Mouth/Throat: Oropharynx is clear and moist.  Neck: Normal range of motion.  Cardiovascular: Normal rate, regular rhythm and normal heart sounds.   Pulmonary/Chest: Effort normal and breath sounds normal.  Abdominal: Bowel sounds are normal.  Musculoskeletal: Normal range of motion.  Neurological: She is alert and oriented to person, place, and time.  Skin: Skin is warm and dry.  Psychiatric: She has a normal mood and affect.    Toradol 60 mg IM x1.  Assessment & Plan:  Assessment: Tension headache, hypertension  Plan: Encouraged medication compliance. Prescription to the pharmacy for tramadol 50 mg one to 2 tablets every 8 hours when necessary for headaches. Continue Norvasc 5 mg. Return for a complete physical exam in a couple weeks. This will give [place] an opportunity to reevaluate her blood pressure. Call the office if symptoms worsen or persist. Recheck as scheduled, and when necessary.

## 2011-08-20 NOTE — Progress Notes (Signed)
Addended by: Aniceto Boss A on: 08/20/2011 03:02 PM   Modules accepted: Orders

## 2011-08-22 ENCOUNTER — Telehealth: Payer: Self-pay | Admitting: Family

## 2011-08-22 NOTE — Telephone Encounter (Signed)
Pt would like work note extended until 08-26-2011. Pt was sch to return to work today,however still having headaches and blurry vison. Pt would like to pick up work note today. Pt does not work on weekends

## 2011-08-22 NOTE — Telephone Encounter (Signed)
lmom for pt letter is ready for pick up

## 2011-08-22 NOTE — Telephone Encounter (Signed)
Letter printed and ready for pick up

## 2011-09-27 ENCOUNTER — Other Ambulatory Visit (INDEPENDENT_AMBULATORY_CARE_PROVIDER_SITE_OTHER): Payer: BC Managed Care – PPO

## 2011-09-27 DIAGNOSIS — Z0000 Encounter for general adult medical examination without abnormal findings: Secondary | ICD-10-CM

## 2011-09-27 LAB — POCT URINALYSIS DIPSTICK
Nitrite, UA: NEGATIVE
Protein, UA: NEGATIVE
Spec Grav, UA: 1.02
Urobilinogen, UA: 0.2

## 2011-09-28 LAB — BASIC METABOLIC PANEL
BUN: 12 mg/dL (ref 6–23)
Calcium: 9.5 mg/dL (ref 8.4–10.5)
Glucose, Bld: 83 mg/dL (ref 70–99)

## 2011-09-28 LAB — HEPATIC FUNCTION PANEL
ALT: 8 U/L (ref 0–35)
AST: 14 U/L (ref 0–37)
Alkaline Phosphatase: 66 U/L (ref 39–117)
Bilirubin, Direct: 0.1 mg/dL (ref 0.0–0.3)
Indirect Bilirubin: 0.4 mg/dL (ref 0.0–0.9)

## 2011-09-28 LAB — CBC WITH DIFFERENTIAL/PLATELET
Basophils Absolute: 0 10*3/uL (ref 0.0–0.1)
Eosinophils Relative: 2 % (ref 0–5)
HCT: 35.5 % — ABNORMAL LOW (ref 36.0–46.0)
Hemoglobin: 11.5 g/dL — ABNORMAL LOW (ref 12.0–15.0)
Lymphocytes Relative: 41 % (ref 12–46)
Lymphs Abs: 2.9 10*3/uL (ref 0.7–4.0)
MCV: 81.2 fL (ref 78.0–100.0)
Monocytes Absolute: 0.5 10*3/uL (ref 0.1–1.0)
Monocytes Relative: 8 % (ref 3–12)
Neutro Abs: 3.5 10*3/uL (ref 1.7–7.7)
RBC: 4.37 MIL/uL (ref 3.87–5.11)
WBC: 7.1 10*3/uL (ref 4.0–10.5)

## 2011-09-28 LAB — LIPID PANEL
Cholesterol: 156 mg/dL (ref 0–200)
LDL Cholesterol: 102 mg/dL — ABNORMAL HIGH (ref 0–99)
Total CHOL/HDL Ratio: 3.4 Ratio
VLDL: 8 mg/dL (ref 0–40)

## 2011-10-07 ENCOUNTER — Ambulatory Visit (INDEPENDENT_AMBULATORY_CARE_PROVIDER_SITE_OTHER): Payer: BC Managed Care – PPO | Admitting: Family Medicine

## 2011-10-07 ENCOUNTER — Other Ambulatory Visit (HOSPITAL_COMMUNITY)
Admission: RE | Admit: 2011-10-07 | Discharge: 2011-10-07 | Disposition: A | Discharge status: Home / Self Care | Payer: BC Managed Care – PPO | Source: Ambulatory Visit | Attending: Family Medicine | Admitting: Family Medicine

## 2011-10-07 ENCOUNTER — Encounter: Payer: Self-pay | Admitting: Family Medicine

## 2011-10-07 VITALS — BP 130/70 | HR 72 | Temp 98.5°F | Resp 12 | Ht 64.25 in | Wt 216.0 lb

## 2011-10-07 DIAGNOSIS — Z01419 Encounter for gynecological examination (general) (routine) without abnormal findings: Secondary | ICD-10-CM | POA: Insufficient documentation

## 2011-10-07 DIAGNOSIS — Z0000 Encounter for general adult medical examination without abnormal findings: Secondary | ICD-10-CM

## 2011-10-07 NOTE — Progress Notes (Signed)
  Subjective:    Patient ID: Jamie Allison, female    DOB: 06/07/1975, 36 y.o.   MRN: [id number]  HPI  Patient seen for complete physical and wellness visit. She has hypertension treated with amlodipine. Tetanus is up-to-date. She's been exercising with softball and exercise class couple times per week. Overall feels well. She has had some recent soreness anterior legs bilaterally. Worse with running. She has not tried any icing.  Patient has sickle cell trait. History of chronic low-grade anemia. Nonsmoker. No alcohol use.  Past Medical History  Diagnosis Date  . CHLAMYDIAL INFECTION 11/29/2009  . 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 DEPRESSION 10/20/2008  . HYPERTENSION 10/20/2008  . Polyuria 02/15/2010  . LUMBAR STRAIN, ACUTE 08/17/2009  . Sickle cell anemia     trait  . Herpes   . Headache    Past Surgical History  Procedure Date  . Cesarean section     X 3  . Tubal ligation     reports that she has never smoked. She has never used smokeless tobacco. She reports that she does not drink alcohol or use illicit drugs. family history includes Arthritis in her other; Depression in her other; and Hypertension in her other. No Known Allergies    Review of Systems  Constitutional: Negative for fever, activity change, appetite change, fatigue and unexpected weight change.  HENT: Negative for hearing loss, ear pain, sore throat and trouble swallowing.   Eyes: Negative for visual disturbance.  Respiratory: Negative for cough and shortness of breath.   Cardiovascular: Negative for chest pain and palpitations.  Gastrointestinal: Negative for abdominal pain, diarrhea, constipation and blood in stool.  Genitourinary: Negative for dysuria and hematuria.  Musculoskeletal: Negative for myalgias, back pain and arthralgias.  Skin: Negative for rash.  Neurological: Negative for dizziness, syncope and headaches.  Hematological: Negative for adenopathy.  Psychiatric/Behavioral: Negative for  confusion and dysphoric mood.       Objective:   Physical Exam  Constitutional: She is oriented to person, place, and time. She appears well-developed and well-nourished.  HENT:  Head: Normocephalic and atraumatic.  Eyes: EOM are normal. Pupils are equal, round, and reactive to light.  Neck: Normal range of motion. Neck supple. No thyromegaly present.  Cardiovascular: Normal rate, regular rhythm and normal heart sounds.   No murmur heard. Pulmonary/Chest: Breath sounds normal. No respiratory distress. She has no wheezes. She has no rales.  Abdominal: Soft. Bowel sounds are normal. She exhibits no distension and no mass. There is no tenderness. There is no rebound and no guarding.  Musculoskeletal: Normal range of motion. She exhibits no edema.  Lymphadenopathy:    She has no cervical adenopathy.  Neurological: She is alert and oriented to person, place, and time. She displays normal reflexes. No cranial nerve deficit.  Skin: No rash noted.  Psychiatric: She has a normal mood and affect. Her behavior is normal. Judgment and thought content normal.          Assessment & Plan:  Complete physical. Pap smear obtained. Labs reviewed with patient. Continue regular exercise and weight loss efforts. Tetanus up-to-date.

## 2011-10-07 NOTE — Patient Instructions (Signed)
Consider use of ice after running for lower leg pain. Consider short-term use of Advil or Aleve.

## 2011-10-15 ENCOUNTER — Telehealth: Payer: Self-pay | Admitting: Family Medicine

## 2011-10-15 NOTE — Telephone Encounter (Signed)
Call placed to patient at [phone], she was advised per Dr Caryl Never  Instructions.

## 2011-10-15 NOTE — Telephone Encounter (Signed)
Pt requesting result of pap °

## 2011-12-29 ENCOUNTER — Other Ambulatory Visit: Payer: Self-pay | Admitting: Family Medicine

## 2012-01-28 ENCOUNTER — Encounter: Payer: Self-pay | Admitting: Family Medicine

## 2012-01-28 ENCOUNTER — Ambulatory Visit (INDEPENDENT_AMBULATORY_CARE_PROVIDER_SITE_OTHER): Payer: BC Managed Care – PPO | Admitting: Family Medicine

## 2012-01-28 VITALS — BP 138/84 | Temp 98.4°F | Wt 225.0 lb

## 2012-01-28 DIAGNOSIS — I10 Essential (primary) hypertension: Secondary | ICD-10-CM

## 2012-01-28 DIAGNOSIS — N898 Other specified noninflammatory disorders of vagina: Secondary | ICD-10-CM

## 2012-01-28 DIAGNOSIS — N949 Unspecified condition associated with female genital organs and menstrual cycle: Secondary | ICD-10-CM

## 2012-01-28 DIAGNOSIS — M25473 Effusion, unspecified ankle: Secondary | ICD-10-CM

## 2012-01-28 DIAGNOSIS — R350 Frequency of micturition: Secondary | ICD-10-CM

## 2012-01-28 DIAGNOSIS — R609 Edema, unspecified: Secondary | ICD-10-CM

## 2012-01-28 LAB — POCT URINALYSIS DIPSTICK
Ketones, UA: NEGATIVE
Leukocytes, UA: NEGATIVE
Protein, UA: NEGATIVE
Spec Grav, UA: <=1.005
pH, UA: 8.5

## 2012-01-28 MED ORDER — TRIAMTERENE-HCTZ 37.5-25 MG PO TABS: 1.0000 | ORAL_TABLET | Freq: Every day | ORAL | Status: DC

## 2012-01-28 NOTE — Progress Notes (Signed)
  Subjective:    Patient ID: Jamie Allison, female    DOB: [birth year], 36 y.o.   MRN: [id number]  HPI  Patient seen for the following issues  History of hypertension treated with amlodipine. One week history of progressive swelling feet and ankles. No dyspnea. No orthopnea. No dietary changes. She's been playing softball and had no difficulties with running but she has had some discomfort over the past couple days. She's never had significant edema issues previously with amlodipine. Swelling is relatively symmetric  She's noticed increased vaginal odor over the past month or so. No burning with urination but has noted increased odor. She is concerned about possible UTI. No increase in volume of her normal vaginal discharge. No fever. No chills.  Past Medical History  Diagnosis Date  . CHLAMYDIAL INFECTION 11/29/2009  . 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 DEPRESSION 10/20/2008  . HYPERTENSION 10/20/2008  . Polyuria 02/15/2010  . LUMBAR STRAIN, ACUTE 08/17/2009  . Sickle cell anemia     trait  . Herpes   . Headache    Past Surgical History  Procedure Date  . Cesarean section     X 3  . Tubal ligation     reports that she has never smoked. She has never used smokeless tobacco. She reports that she does not drink alcohol or use illicit drugs. family history includes Arthritis in her other; Depression in her other; and Hypertension in her other. No Known Allergies    Review of Systems  Constitutional: Negative for fatigue.  Eyes: Negative for visual disturbance.  Respiratory: Negative for cough, chest tightness, shortness of breath and wheezing.   Cardiovascular: Positive for leg swelling. Negative for chest pain and palpitations.  Genitourinary: Negative for dysuria, frequency, hematuria and genital sores.  Neurological: Negative for dizziness, seizures, syncope, weakness, light-headedness and headaches.       Objective:   Physical Exam  Constitutional: She appears well-developed  and well-nourished.  HENT:  Mouth/Throat: Oropharynx is clear and moist.  Neck: Neck supple. No thyromegaly present.  Cardiovascular: Normal rate and regular rhythm.   No murmur heard. Pulmonary/Chest: Breath sounds normal. No respiratory distress. She has no wheezes. She has no rales.  Musculoskeletal:       Trace edema feet and ankles bilaterally          Assessment & Plan:  #1 feet and ankle edema. Probably related partly to amlodipine. Blood pressure also suboptimally controlled. Previous hypokalemia with plain HCTZ. Cautious trial of Maxzide 37.5/25 mg one daily. Increase potassium rich foods. Reassess one month and check basic metabolic panel then  #2 hypertension suboptimal control. Additional medication as above #3 dysuria. No evidence for UTI. Patient complaining of increased vaginal odor. Check wet prep. Previous history of bacterial vaginosis.

## 2012-01-28 NOTE — Patient Instructions (Addendum)
Potassium Content of Foods  Potassium is a mineral. The body needs potassium to control blood pressure and to keep the muscles and nervous system healthy. Most foods contain potassium. Eating a variety of foods in the right amounts will help control the level of potassium in your body.  Kidney problems can cause there to be too much potassium in the body. If this happens, you may need to lower the amount of potassium in your diet. Some medicines such as diuretics may cause your body to lose too much potassium. If this happens, you may need to increase the amount of potassium in your diet.  COMMON SERVING SIZES  The list below tells you how big or small some common portion sizes are:   1 oz.........4 stacked dice.    3 oz.........Deck of cards.    1 tsp........Tip of little finger.    1 tbs........Thumb.    2 tbs........Golf ball.     cup.......Half of a fist.    1 cup........A fist.   FOODS HIGH IN POTASSIUM  More than 250 mg per serving.   Bran cereals and other bran products.    Milk (skim, 1%, 2%, whole).    Buttermilk.    Yogurt.    Avocados.    Bananas.    Dried fruits.    Kiwis.    Oranges.    Prunes.    Raisins.    Baked beans.    Spinach.    Tomatoes.    Peanut butter.    Nuts.    Tofu.    Potatoes.   FOODS MODERATE IN POTASSIUM  Between 150 to 250mg per serving.   Cherries.    Mangoes.    Asparagus.    Peas.    Zucchini.    Celery.    [place].    Peaches, fresh.    Broccoli stalks.    Peppers.    Figs.    Pears, fresh.    Kale.    Summer squashes.   FOODS LOW IN POTASSIUM  Less than 150mg per serving.   Pasta.    Rice.    Cottage cheese.    Cheddar cheese.    Apples.    Grapes.    Pineapple.    Raspberries.    Strawberries.    Watermelon.    Green beans.    Cabbage.    Cauliflower.    Corn.    Mushrooms.    Onions.    Eggs.   Document Released: 12/18/2004 Document Revised: 04/25/2011 Document Reviewed: 04/14/2007   [employer] Patient Information 2012 [employer].

## 2012-01-31 ENCOUNTER — Telehealth: Payer: Self-pay | Admitting: Family Medicine

## 2012-01-31 NOTE — Telephone Encounter (Signed)
Results should be in by Monday.

## 2012-01-31 NOTE — Telephone Encounter (Signed)
Pt is calling requesting ua and std results

## 2012-01-31 NOTE — Telephone Encounter (Signed)
Wet prep says "in process".  Let's check to see why not yet resulted.

## 2012-02-10 ENCOUNTER — Telehealth: Payer: Self-pay | Admitting: Family Medicine

## 2012-02-10 NOTE — Telephone Encounter (Signed)
Pt called req to get test/lab results.

## 2012-02-10 NOTE — Telephone Encounter (Signed)
Wet prep-bacterial vaginosis.  Start Metronidazole 500 mg po tid for 7 days.

## 2012-02-10 NOTE — Telephone Encounter (Signed)
Labs received, given to Dr Caryl Never

## 2012-02-10 NOTE — Telephone Encounter (Signed)
Faxing results to me now. I will make sure you get them.

## 2012-02-10 NOTE — Telephone Encounter (Signed)
Gwen, wet prep done 9/10, says it is still in process.  Please assist as pt has been very patient.

## 2012-02-10 NOTE — Telephone Encounter (Signed)
I am not sure why this took so long.  Wet prep showed bacterial vaginosis.  Start

## 2012-02-11 MED ORDER — METRONIDAZOLE 500 MG PO TABS: 500.0000 mg | ORAL_TABLET | Freq: Three times a day (TID) | ORAL | Status: DC

## 2012-02-11 NOTE — Telephone Encounter (Signed)
Pt informed, Rx sent. 

## 2012-02-11 NOTE — Addendum Note (Signed)
Addended by: Melchor Amour on: 02/11/2012 12:25 PM   Modules accepted: Orders

## 2012-02-12 ENCOUNTER — Encounter: Payer: Self-pay | Admitting: Family Medicine

## 2012-02-27 ENCOUNTER — Ambulatory Visit: Payer: BC Managed Care – PPO | Admitting: Family Medicine

## 2012-02-29 ENCOUNTER — Other Ambulatory Visit: Payer: Self-pay | Admitting: Family Medicine

## 2012-07-08 ENCOUNTER — Other Ambulatory Visit: Payer: Self-pay | Admitting: Family Medicine

## 2012-10-08 ENCOUNTER — Other Ambulatory Visit (INDEPENDENT_AMBULATORY_CARE_PROVIDER_SITE_OTHER): Payer: BC Managed Care – PPO

## 2012-10-08 DIAGNOSIS — Z0000 Encounter for general adult medical examination without abnormal findings: Secondary | ICD-10-CM

## 2012-10-08 LAB — POCT URINALYSIS DIPSTICK
Bilirubin, UA: NEGATIVE
Glucose, UA: NEGATIVE
Ketones, UA: NEGATIVE
Leukocytes, UA: NEGATIVE
Nitrite, UA: NEGATIVE

## 2012-10-08 LAB — BASIC METABOLIC PANEL
CO2: 27 mEq/L (ref 19–32)
Calcium: 9.2 mg/dL (ref 8.4–10.5)
Creatinine, Ser: 0.7 mg/dL (ref 0.4–1.2)
GFR: 120.15 mL/min (ref >60.00)

## 2012-10-08 LAB — CBC WITH DIFFERENTIAL/PLATELET
Basophils Absolute: 0 10*3/uL (ref 0.0–0.1)
Eosinophils Absolute: 0.1 10*3/uL (ref 0.0–0.7)
HCT: 35.6 % — ABNORMAL LOW (ref 36.0–46.0)
Hemoglobin: 11.9 g/dL — ABNORMAL LOW (ref 12.0–15.0)
Lymphs Abs: 2.3 10*3/uL (ref 0.7–4.0)
MCHC: 33.3 g/dL (ref 30.0–36.0)
MCV: 81.5 fl (ref 78.0–100.0)
Monocytes Absolute: 0.3 10*3/uL (ref 0.1–1.0)
Neutro Abs: 2.7 10*3/uL (ref 1.4–7.7)
Platelets: 385 10*3/uL (ref 150.0–400.0)
RDW: 14.8 % — ABNORMAL HIGH (ref 11.5–14.6)

## 2012-10-08 LAB — HEPATIC FUNCTION PANEL
ALT: 11 U/L (ref 0–35)
Alkaline Phosphatase: 58 U/L (ref 39–117)
Bilirubin, Direct: 0 mg/dL (ref 0.0–0.3)
Total Bilirubin: 0.4 mg/dL (ref 0.3–1.2)

## 2012-10-08 LAB — LIPID PANEL
Cholesterol: 163 mg/dL (ref 0–200)
Total CHOL/HDL Ratio: 4
Triglycerides: 68 mg/dL (ref 0.0–149.0)

## 2012-10-15 ENCOUNTER — Encounter: Payer: Self-pay | Admitting: Family Medicine

## 2012-10-15 ENCOUNTER — Other Ambulatory Visit (HOSPITAL_COMMUNITY)
Admission: RE | Admit: 2012-10-15 | Discharge: 2012-10-15 | Disposition: A | Discharge status: Home / Self Care | Payer: BC Managed Care – PPO | Source: Ambulatory Visit | Attending: Family Medicine | Admitting: Family Medicine

## 2012-10-15 ENCOUNTER — Ambulatory Visit (INDEPENDENT_AMBULATORY_CARE_PROVIDER_SITE_OTHER): Payer: BC Managed Care – PPO | Admitting: Family Medicine

## 2012-10-15 VITALS — BP 150/82 | HR 80 | Temp 98.4°F | Resp 12 | Ht 64.25 in | Wt 228.0 lb

## 2012-10-15 DIAGNOSIS — R319 Hematuria, unspecified: Secondary | ICD-10-CM

## 2012-10-15 DIAGNOSIS — Z01419 Encounter for gynecological examination (general) (routine) without abnormal findings: Secondary | ICD-10-CM | POA: Insufficient documentation

## 2012-10-15 DIAGNOSIS — I10 Essential (primary) hypertension: Secondary | ICD-10-CM

## 2012-10-15 DIAGNOSIS — R21 Rash and other nonspecific skin eruption: Secondary | ICD-10-CM

## 2012-10-15 DIAGNOSIS — Z0000 Encounter for general adult medical examination without abnormal findings: Secondary | ICD-10-CM

## 2012-10-15 LAB — URINALYSIS, ROUTINE W REFLEX MICROSCOPIC
Nitrite: NEGATIVE
Specific Gravity, Urine: 1.015 (ref 1.000–1.030)
Urine Glucose: NEGATIVE
Urobilinogen, UA: 0.2 (ref 0.0–1.0)

## 2012-10-15 LAB — POCT URINALYSIS DIPSTICK
Glucose, UA: NEGATIVE
Ketones, UA: NEGATIVE
Spec Grav, UA: 1.02

## 2012-10-15 MED ORDER — FLUCONAZOLE 100 MG PO TABS: ORAL_TABLET | ORAL | Status: DC

## 2012-10-15 NOTE — Progress Notes (Signed)
  Subjective:    Patient ID: Jamie Allison, female    DOB: 09/23/1975, 37 y.o.   MRN: [id number]  HPI Patient seen for complete physical She has history of hypertension and apparently ran out of her blood pressure medication 2 weeks ago because of cost. She's been taking amlodipine and is not been checking blood pressure. She is here today for complete physical. Last tetanus 2010. Last menstrual period about 2 weeks ago.  She has persistent pruritic rash mostly waist area bilaterally and intermittently groin region. Has taken nystatin intermittently which seems to help temporarily.  Past Medical History  Diagnosis Date  . CHLAMYDIAL INFECTION 11/29/2009  . 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 DEPRESSION 10/20/2008  . HYPERTENSION 10/20/2008  . Polyuria 02/15/2010  . LUMBAR STRAIN, ACUTE 08/17/2009  . Sickle cell anemia     trait  . Herpes   . VHQIONGE(952.8)    Past Surgical History  Procedure Laterality Date  . Cesarean section      X 3  . Tubal ligation      reports that she has never smoked. She has never used smokeless tobacco. She reports that she does not drink alcohol or use illicit drugs. family history includes Arthritis in her other; Depression in her other; and Hypertension in her other. No Known Allergies    Review of Systems  Constitutional: Negative for fever, activity change, appetite change, fatigue and unexpected weight change.  HENT: Negative for hearing loss, ear pain, sore throat and trouble swallowing.   Eyes: Negative for visual disturbance.  Respiratory: Negative for cough and shortness of breath.   Cardiovascular: Negative for chest pain and palpitations.  Gastrointestinal: Negative for abdominal pain, diarrhea, constipation and blood in stool.  Genitourinary: Negative for dysuria and hematuria.  Musculoskeletal: Negative for myalgias, back pain and arthralgias.  Skin: Positive for rash.  Neurological: Negative for dizziness, syncope and headaches.   Hematological: Negative for adenopathy.  Psychiatric/Behavioral: Negative for confusion and dysphoric mood.       Objective:   Physical Exam  Constitutional: She appears well-developed and well-nourished.  Cardiovascular: Normal rate and regular rhythm.   No murmur heard. Pulmonary/Chest: Effort normal and breath sounds normal. No respiratory distress. She has no wheezes. She has no rales.  Genitourinary:  Breasts no mass. No skin dimpling Pelvic-normal external genitalia.  Pap obtained.  Bimanual difficult secondary to size.  No adnexal mass.  Musculoskeletal: She exhibits no edema.  Skin: Rash noted.  Patient has rash with fairly well demarcated borders slightly scaly advancing border but nonscaly center region. This covers left and right lower abdomen. No pustules. No vesicles.          Assessment & Plan:  Physical exam. Tetanus up-to-date. Discussed exercise and weight loss. Get back on blood pressure medication.  Pap smear obtained  Skin rash. This does not look like classic tenia corporis. Doubt eczema. She's also had some persistent tenia cruris. She has had partial response with nystatin cream. Continue nystatin and start fluconazole 100 mg daily for 10 days. Reassess 3-4 weeks-consider bx if not improved then.  Hypertension. Currently not treated. Get back on amlodipine  Hematuria by urine dipstick. Send urine for micro-

## 2012-10-28 ENCOUNTER — Encounter (HOSPITAL_COMMUNITY): Payer: Self-pay | Admitting: *Deleted

## 2012-10-28 ENCOUNTER — Emergency Department (HOSPITAL_COMMUNITY)
Admission: EM | Admit: 2012-10-28 | Discharge: 2012-10-28 | Disposition: A | Discharge status: Home / Self Care | Payer: BC Managed Care – PPO | Attending: Emergency Medicine | Admitting: Emergency Medicine

## 2012-10-28 ENCOUNTER — Telehealth: Payer: Self-pay | Admitting: Family Medicine

## 2012-10-28 DIAGNOSIS — N949 Unspecified condition associated with female genital organs and menstrual cycle: Secondary | ICD-10-CM | POA: Insufficient documentation

## 2012-10-28 DIAGNOSIS — Z87828 Personal history of other (healed) physical injury and trauma: Secondary | ICD-10-CM | POA: Insufficient documentation

## 2012-10-28 DIAGNOSIS — Z8619 Personal history of other infectious and parasitic diseases: Secondary | ICD-10-CM | POA: Insufficient documentation

## 2012-10-28 DIAGNOSIS — N938 Other specified abnormal uterine and vaginal bleeding: Secondary | ICD-10-CM | POA: Insufficient documentation

## 2012-10-28 DIAGNOSIS — Z3202 Encounter for pregnancy test, result negative: Secondary | ICD-10-CM | POA: Insufficient documentation

## 2012-10-28 DIAGNOSIS — Z8659 Personal history of other mental and behavioral disorders: Secondary | ICD-10-CM | POA: Insufficient documentation

## 2012-10-28 DIAGNOSIS — I10 Essential (primary) hypertension: Secondary | ICD-10-CM | POA: Insufficient documentation

## 2012-10-28 DIAGNOSIS — Z862 Personal history of diseases of the blood and blood-forming organs and certain disorders involving the immune mechanism: Secondary | ICD-10-CM | POA: Insufficient documentation

## 2012-10-28 DIAGNOSIS — Z8679 Personal history of other diseases of the circulatory system: Secondary | ICD-10-CM | POA: Insufficient documentation

## 2012-10-28 DIAGNOSIS — Z87448 Personal history of other diseases of urinary system: Secondary | ICD-10-CM | POA: Insufficient documentation

## 2012-10-28 DIAGNOSIS — E669 Obesity, unspecified: Secondary | ICD-10-CM | POA: Insufficient documentation

## 2012-10-28 DIAGNOSIS — Z79899 Other long term (current) drug therapy: Secondary | ICD-10-CM | POA: Insufficient documentation

## 2012-10-28 LAB — CBC
MCH: 28 pg (ref 26.0–34.0)
MCHC: 35.4 g/dL (ref 30.0–36.0)
MCV: 79 fL (ref 78.0–100.0)
Platelets: 450 10*3/uL — ABNORMAL HIGH (ref 150–400)
RBC: 4.47 MIL/uL (ref 3.87–5.11)
RDW: 13.7 % (ref 11.5–15.5)

## 2012-10-28 LAB — POCT PREGNANCY, URINE: Preg Test, Ur: NEGATIVE

## 2012-10-28 MED ORDER — NAPROXEN 375 MG PO TABS: 375.0000 mg | ORAL_TABLET | Freq: Two times a day (BID) | ORAL | Status: DC

## 2012-10-28 MED ORDER — KETOROLAC TROMETHAMINE 30 MG/ML IJ SOLN
60.0000 mg | Freq: Once | INTRAMUSCULAR | Status: AC
  Administered 2012-10-28: 60 mg via INTRAMUSCULAR
  Filled 2012-10-28: qty 2

## 2012-10-28 NOTE — Telephone Encounter (Signed)
Dr Caryl Never reviewed printed phone note and he indicated "ER recommended, pt has history of tubal ligation", she will need labs, etc.  Pt informed and she voiced her understanding.

## 2012-10-28 NOTE — ED Notes (Signed)
Pt is here with lower abdominal pain since yesterday and states that she is on her menstrual and having blood clots.  Pt was told to come here to rule out ovarian cysts.  Pt reports lower back pain.

## 2012-10-28 NOTE — Telephone Encounter (Signed)
Patient Information:  Caller Name: Dae  Phone: [phone]  Patient: Jamie Allison, Jamie Allison  Gender: Female  DOB: 1975/08/17  Age: 37 Years  PCP: Evelena Peat (Family Practice)  Pregnant: No  Office Follow Up:  Does the office need to follow up with this patient?: Yes  Instructions For The Office: PLS READ RN NOTE  RN Note:  Pt woke w/ severe Abdominal cramping and lower Back pain on 6-10, Pt has had to miss 2 days of work.  Pt has a history of miscarriage over 10 years ago, pain feels similar, Pt has history of tubal. Pt is currently on menstrual, has never had cramps this severe. Pt noticed large blood clots while urinating, size of quarter, onset 6-10. Pt is changing pad q3 hrs. Pt had dizziness on 6-10, denies dizziness at time of call. Consulted w/ Tim Lair, RN at office due to ED dispo, RN will call Pt back after discussing w/ Dr Caryl Never.  Pt verbalized understanding.  Symptoms  Reason For Call & Symptoms: Abdominal Cramps  Reviewed Health History In EMR: Yes  Reviewed Medications In EMR: Yes  Reviewed Allergies In EMR: Yes  Reviewed Surgeries / Procedures: Yes  Date of Onset of Symptoms: 10/27/2012  Treatments Tried: Acetaminophen x2 extra strength taken at 8am on 6-11  Treatments Tried Worked: No OB / GYN:  LMP: 10/28/2012  Guideline(s) Used:  Abdominal Pain - Female  Disposition Per Guideline:   Go to ED Now  Reason For Disposition Reached:   Severe abdominal pain (e.g., excruciating)  Advice Given:  N/A  RN Overrode Recommendation:  Document Patient  Office will call Pt back after discuss ED dispo w/ MD, MD will decide.

## 2012-10-28 NOTE — ED Provider Notes (Signed)
History     CSN: [id number]  Arrival date & time 10/28/12  1131   First MD Initiated Contact with Patient 10/28/12 1630      Chief Complaint  Patient presents with  . Abdominal Pain    (Consider location/radiation/quality/duration/timing/severity/associated sxs/prior treatment) HPI Comments: Patient complains of lower gone or pain. She states she started her period 3 days ago and yesterday started having some heavier clots. She's had some lower gone all cramping. She states the cramping and the bleeding is worse than her normal period. She has a history of a tubal ligation and was sent over here presumptively for concern of possible ectopic pregnancy. She states her last period about 3 weeks ago. She denies any vaginal discharge. She denies any unilateral pain. She denies any urinary symptoms. She has no nausea vomiting or fevers.   Past Medical History  Diagnosis Date  . CHLAMYDIAL INFECTION 11/29/2009  . 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 DEPRESSION 10/20/2008  . HYPERTENSION 10/20/2008  . Polyuria 02/15/2010  . LUMBAR STRAIN, ACUTE 08/17/2009  . Sickle cell anemia     trait  . Herpes   . [id number](478.2)     Past Surgical History  Procedure Laterality Date  . Cesarean section      X 3  . Tubal ligation      Family History  Problem Relation Age of Onset  . Arthritis Other   . Hypertension Other   . Depression Other     History  Substance Use Topics  . Smoking status: Never Smoker   . Smokeless tobacco: Never Used  . Alcohol Use: No    OB History   Grav Para Term Preterm Abortions TAB SAB Ect Mult Living                  Review of Systems  Constitutional: Negative for fever, chills, diaphoresis and fatigue.  HENT: Negative for congestion, rhinorrhea and sneezing.   Eyes: Negative.   Respiratory: Negative for cough, chest tightness and shortness of breath.   Cardiovascular: Negative for chest pain and leg swelling.  Gastrointestinal: Positive for abdominal pain.  Negative for nausea, vomiting, diarrhea and blood in stool.  Genitourinary: Positive for vaginal bleeding and pelvic pain. Negative for frequency, hematuria, flank pain, vaginal discharge and difficulty urinating.  Musculoskeletal: Negative for back pain and arthralgias.  Skin: Negative for rash.  Neurological: Negative for dizziness, speech difficulty, weakness, numbness and headaches.    Allergies  Review of patient's allergies indicates no known allergies.  Home Medications   Current Outpatient Rx  Name  Route  Sig  Dispense  Refill  . amLODipine (NORVASC) 5 MG tablet      TAKE ONE TABLET BY MOUTH EVERY DAY   30 tablet   11   . nystatin cream (MYCOSTATIN)   Topical   Apply 1 application topically daily as needed for dry skin.         . naproxen (NAPROSYN) 375 MG tablet   Oral   Take 1 tablet (375 mg total) by mouth 2 (two) times daily.   20 tablet   0     BP 139/72  Pulse 65  Temp(Src) 98.2 F (36.8 C) (Oral)  Resp 18  SpO2 98%  Physical Exam  Constitutional: She is oriented to person, place, and time. She appears well-developed and well-nourished.  HENT:  Head: Normocephalic and atraumatic.  Eyes: Pupils are equal, round, and reactive to light.  Neck: Normal range of motion. Neck supple.  Cardiovascular: Normal  rate, regular rhythm and normal heart sounds.   Pulmonary/Chest: Effort normal and breath sounds normal. No respiratory distress. She has no wheezes. She has no rales. She exhibits no tenderness.  Abdominal: Soft. Bowel sounds are normal. There is no tenderness (Positive tenderness to the lower abdomen. She is mostly tender suprapubic area but is also tender across the lower abdomen. There is no peritoneal signs.). There is no rebound and no guarding.  Genitourinary:  Patient with some clots in the vaginal vault. Once these were cleaned out there is no ongoing bleeding. There is no cervical motion tenderness. No adnexal tenderness.  Musculoskeletal:  Normal range of motion. She exhibits no edema.  Lymphadenopathy:    She has no cervical adenopathy.  Neurological: She is alert and oriented to person, place, and time.  Skin: Skin is warm and dry. No rash noted.  Psychiatric: She has a normal mood and affect.    ED Course  Procedures (including critical care time)  Results for orders placed during the hospital encounter of 10/28/12  CBC      Result Value Range   WBC 7.3  4.0 - 10.5 K/uL   RBC 4.47  3.87 - 5.11 MIL/uL   Hemoglobin 12.5  12.0 - 15.0 g/dL   HCT 40.1 (*) 02.7 - 25.3 %   MCV 79.0  78.0 - 100.0 fL   MCH 28.0  26.0 - 34.0 pg   MCHC 35.4  30.0 - 36.0 g/dL   RDW 66.4  40.3 - 47.4 %   Platelets 450 (*) 150 - 400 K/uL  POCT PREGNANCY, URINE      Result Value Range   Preg Test, Ur NEGATIVE  NEGATIVE   No results found.    1. Uterine bleeding, dysfunctional       MDM  Patient had negative pregnancy test. She doesn't have any lateral tenderness to be suggestive of ovarian torsion.  Patient has a non-concerning abdominal exam. She was discharged home in good condition. He was given a shot of Toradol here in the ED. She's been taking Tylenol at home for the pain which hasn't been helping slightly give her prescription for Naprosyn. I advised her followup with her primary care physician who also takes care of her gynecologic problems. She has an appointment on Tuesday for followup. I advised her to return here if her bleeding becomes more heavy or pain intensifies.        Rolan Bucco, MD 10/28/12 [phone]

## 2012-11-05 ENCOUNTER — Ambulatory Visit: Payer: BC Managed Care – PPO | Admitting: Family Medicine

## 2013-01-11 ENCOUNTER — Telehealth: Payer: Self-pay | Admitting: Family Medicine

## 2013-01-11 MED ORDER — AMLODIPINE BESYLATE 5 MG PO TABS: ORAL_TABLET | ORAL | Status: DC

## 2013-01-11 NOTE — Telephone Encounter (Signed)
Rx sent to pharmacy   

## 2013-01-11 NOTE — Telephone Encounter (Signed)
Pt states she has been trying to refill her meds amLODipine (NORVASC) 5 MG tablet 1/ day for a week but no evidence [facility] requested. Now pt is out and needs asap. Can you pls send to [facility].

## 2013-01-13 ENCOUNTER — Encounter (HOSPITAL_COMMUNITY): Payer: Self-pay | Admitting: *Deleted

## 2013-01-13 ENCOUNTER — Emergency Department (HOSPITAL_COMMUNITY)
Admission: EM | Admit: 2013-01-13 | Discharge: 2013-01-13 | Disposition: A | Discharge status: Home / Self Care | Payer: BC Managed Care – PPO | Attending: Emergency Medicine | Admitting: Emergency Medicine

## 2013-01-13 DIAGNOSIS — R110 Nausea: Secondary | ICD-10-CM | POA: Insufficient documentation

## 2013-01-13 DIAGNOSIS — Z87828 Personal history of other (healed) physical injury and trauma: Secondary | ICD-10-CM | POA: Insufficient documentation

## 2013-01-13 DIAGNOSIS — I10 Essential (primary) hypertension: Secondary | ICD-10-CM | POA: Insufficient documentation

## 2013-01-13 DIAGNOSIS — E669 Obesity, unspecified: Secondary | ICD-10-CM | POA: Insufficient documentation

## 2013-01-13 DIAGNOSIS — Z8619 Personal history of other infectious and parasitic diseases: Secondary | ICD-10-CM | POA: Insufficient documentation

## 2013-01-13 DIAGNOSIS — R51 Headache: Secondary | ICD-10-CM | POA: Insufficient documentation

## 2013-01-13 DIAGNOSIS — Z862 Personal history of diseases of the blood and blood-forming organs and certain disorders involving the immune mechanism: Secondary | ICD-10-CM | POA: Insufficient documentation

## 2013-01-13 DIAGNOSIS — Z791 Long term (current) use of non-steroidal anti-inflammatories (NSAID): Secondary | ICD-10-CM | POA: Insufficient documentation

## 2013-01-13 DIAGNOSIS — Z8659 Personal history of other mental and behavioral disorders: Secondary | ICD-10-CM | POA: Insufficient documentation

## 2013-01-13 DIAGNOSIS — Z79899 Other long term (current) drug therapy: Secondary | ICD-10-CM | POA: Insufficient documentation

## 2013-01-13 LAB — URINE MICROSCOPIC-ADD ON

## 2013-01-13 LAB — POCT PREGNANCY, URINE: Preg Test, Ur: NEGATIVE

## 2013-01-13 LAB — URINALYSIS, ROUTINE W REFLEX MICROSCOPIC
Glucose, UA: NEGATIVE mg/dL
Leukocytes, UA: NEGATIVE
Nitrite: NEGATIVE
Protein, ur: NEGATIVE mg/dL

## 2013-01-13 MED ORDER — METOCLOPRAMIDE HCL 5 MG/ML IJ SOLN
10.0000 mg | Freq: Once | INTRAMUSCULAR | Status: AC
  Administered 2013-01-13: 10 mg via INTRAVENOUS
  Filled 2013-01-13: qty 2

## 2013-01-13 MED ORDER — KETOROLAC TROMETHAMINE 30 MG/ML IJ SOLN
30.0000 mg | Freq: Once | INTRAMUSCULAR | Status: AC
  Administered 2013-01-13: 30 mg via INTRAVENOUS
  Filled 2013-01-13: qty 1

## 2013-01-13 MED ORDER — DEXAMETHASONE SODIUM PHOSPHATE 10 MG/ML IJ SOLN
10.0000 mg | Freq: Once | INTRAMUSCULAR | Status: AC
  Administered 2013-01-13: 10 mg via INTRAVENOUS
  Filled 2013-01-13: qty 1

## 2013-01-13 MED ORDER — DIPHENHYDRAMINE HCL 50 MG/ML IJ SOLN
25.0000 mg | Freq: Once | INTRAMUSCULAR | Status: AC
  Administered 2013-01-13: 25 mg via INTRAVENOUS
  Filled 2013-01-13: qty 1

## 2013-01-13 MED ORDER — AMLODIPINE BESYLATE 5 MG PO TABS
5.0000 mg | ORAL_TABLET | ORAL | Status: AC
  Administered 2013-01-13: 5 mg via ORAL
  Filled 2013-01-13: qty 1

## 2013-01-13 MED ORDER — SODIUM CHLORIDE 0.9 % IV BOLUS (SEPSIS)
1000.0000 mL | Freq: Once | INTRAVENOUS | Status: AC
  Administered 2013-01-13: 1000 mL via INTRAVENOUS

## 2013-01-13 NOTE — ED Notes (Signed)
Patient is resting comfortably. 

## 2013-01-13 NOTE — ED Notes (Signed)
Browning, PA at the bedside.  

## 2013-01-13 NOTE — ED Notes (Signed)
Pt instructed not to drive and acknowledges understanding.  Daughter present and willing to drive patient home.

## 2013-01-13 NOTE — ED Provider Notes (Signed)
CSN: [id number]     Arrival date & time 01/13/13  4098 History   First MD Initiated Contact with Patient 01/13/13 [phone]     No chief complaint on file.  (Consider location/radiation/quality/duration/timing/severity/associated sxs/prior Treatment) HPI Comments: Patient presents to the emergency department with chief complaint of headache x3 weeks. She states that she has had moderate intermittent headaches lately. The headache did not come on suddenly, but gradually waxes and wanes. She has a history of headaches, but states that they normally do not last this long. She has tried taking OTC pain medicine with no relief. She endorses associated nausea, but no vomiting. She denies fevers, or chills.  She denies syncope or neck stiffness. She states that she recently lost her father. She states that she's been under a lot of stress. She has not been taking her blood pressure medications, and also requests a refill.  The history is provided by the patient. No language interpreter was used.    Past Medical History  Diagnosis Date  . CHLAMYDIAL INFECTION 11/29/2009  . 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 DEPRESSION 10/20/2008  . HYPERTENSION 10/20/2008  . Polyuria 02/15/2010  . LUMBAR STRAIN, ACUTE 08/17/2009  . Sickle cell anemia     trait  . Herpes   . YNWGNFAO(130.8)    Past Surgical History  Procedure Laterality Date  . Cesarean section      X 3  . Tubal ligation     Family History  Problem Relation Age of Onset  . Arthritis Other   . Hypertension Other   . Depression Other    History  Substance Use Topics  . Smoking status: Never Smoker   . Smokeless tobacco: Never Used  . Alcohol Use: No   OB History   Grav Para Term Preterm Abortions TAB SAB Ect Mult Living                 Review of Systems  All other systems reviewed and are negative.    Allergies  Review of patient's allergies indicates no known allergies.  Home Medications   Current Outpatient Rx  Name  Route  Sig   Dispense  Refill  . amLODipine (NORVASC) 5 MG tablet      TAKE ONE TABLET BY MOUTH EVERY DAY   30 tablet   0     Patient needs office visit to get refills   . naproxen (NAPROSYN) 375 MG tablet   Oral   Take 1 tablet (375 mg total) by mouth 2 (two) times daily.   20 tablet   0   . nystatin cream (MYCOSTATIN)   Topical   Apply 1 application topically daily as needed for dry skin.          BP 177/95  Pulse 96  Temp(Src) 98.3 F (36.8 C) (Oral)  Resp 20  SpO2 100%  LMP 12/22/2012 Physical Exam  Nursing note and vitals reviewed. Constitutional: She is oriented to person, place, and time. She appears well-developed and well-nourished.  HENT:  Head: Normocephalic and atraumatic.  Right Ear: External ear normal.  Left Ear: External ear normal.  Non-tender over temporal artery, no increased pain with chewing. Tympanic membranes clear bilaterally.  Eyes: Conjunctivae and EOM are normal. Pupils are equal, round, and reactive to light.  No papilledema  Neck: Normal range of motion. Neck supple.  No pain with neck flexion, no meningismus  Cardiovascular: Normal rate, regular rhythm and normal heart sounds.  Exam reveals no gallop and no friction rub.  No murmur heard. Pulmonary/Chest: Effort normal and breath sounds normal. No respiratory distress. She has no wheezes. She has no rales. She exhibits no tenderness.  Abdominal: Soft. Bowel sounds are normal. She exhibits no distension and no mass. There is no tenderness. There is no rebound and no guarding.  Musculoskeletal: Normal range of motion. She exhibits no edema and no tenderness.  Normal gait. No ataxia  Neurological: She is alert and oriented to person, place, and time. She has normal reflexes.  CN 3-12 intact, no pronator drift, normal shin to heel, normal RAM, sensation and strength intact bilaterally.  Skin: Skin is warm and dry.  Psychiatric: She has a normal mood and affect. Her behavior is normal. Judgment and  thought content normal.    ED Course  Procedures (including critical care time) Labs Review Labs Reviewed  URINALYSIS, ROUTINE W REFLEX MICROSCOPIC - Abnormal; Notable for the following:    APPearance CLOUDY (*)    Hgb urine dipstick TRACE (*)    All other components within normal limits  URINE MICROSCOPIC-ADD ON - Abnormal; Notable for the following:    Bacteria, UA FEW (*)    All other components within normal limits  POCT PREGNANCY, URINE   Imaging Review No results found.  MDM   1. Headache      Patient with intermittent moderate headache that waxes and wanes x3 weeks. Will treat with migraine cocktail, and will reevaluate. No motor deficits, no fevers, no meningeal signs. Patient is well-appearing.  10:07 AM Patient still states that she is having a headache, but that it has lessened in severity.    Discussed the patient with Dr. Ranae Palms, who recommends trying decadron and urine preg.  Will discharge the patient to home. She has significant life stressors currently, which could be contributing to the patient's headache. Recommend primary care followup. There no worrisome findings regarding this particular headache. Return precautions have been given. Patient is stable and ready for discharge.  Roxy Horseman, PA-C 01/13/13 1133

## 2013-01-13 NOTE — ED Notes (Signed)
Rob Browning, PA at bedside  

## 2013-01-13 NOTE — ED Notes (Signed)
Pt is here with headache for a couple of weeks.  Pt states that she has ran out of bp medications, lost her dad and just buried him.  Nauseated in am.  Pt reports seeing sparkle things with change in vision.  Sensitive to sounds.

## 2013-01-14 NOTE — ED Provider Notes (Signed)
Medical screening examination/treatment/procedure(s) were performed by non-physician practitioner and as supervising physician I was immediately available for consultation/collaboration.   Lyanne Co, MD 01/14/13 [phone]

## 2013-01-29 IMAGING — CT CT ABD-PELV W/O CM
2 of 4 series · 13 of 32 positions shown, 18 images · non-contrast
Comparison: None

CLINICAL DATA: Right lower quadrant and right flank pain

CT ABDOMEN AND PELVIS WITHOUT CONTRAST
TECHNIQUE: Multidetector CT imaging of the abdomen and pelvis was
performed following the standard protocol without intravenous
contrast. Sagittal and coronal MPR images reconstructed from axial
data set.

[Series 2: renal stone · axial · 0.70mm/px · z∈[-384,-104]mm · 5 of 85 slices shown, 10 images]
[im 15/85  soft-tissue]
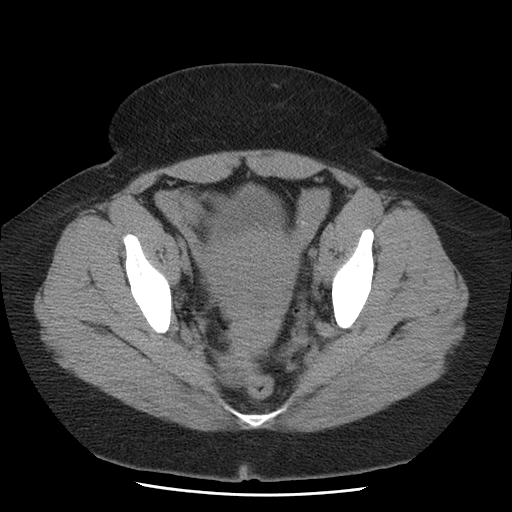
[im 15/85  bone]
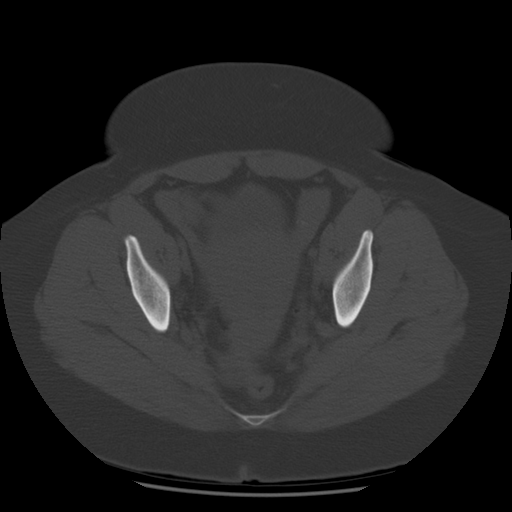
[im 29/85  soft-tissue]
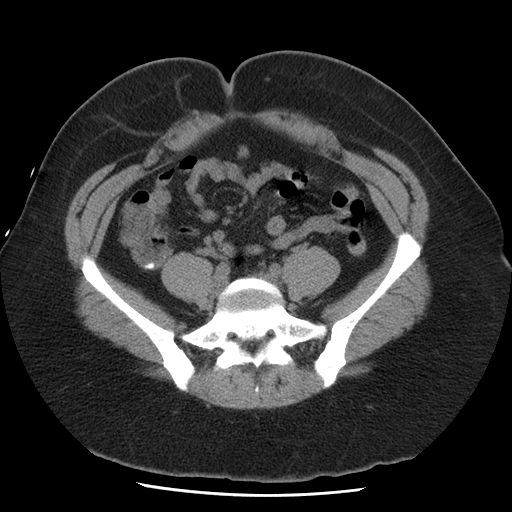
[im 29/85  lung]
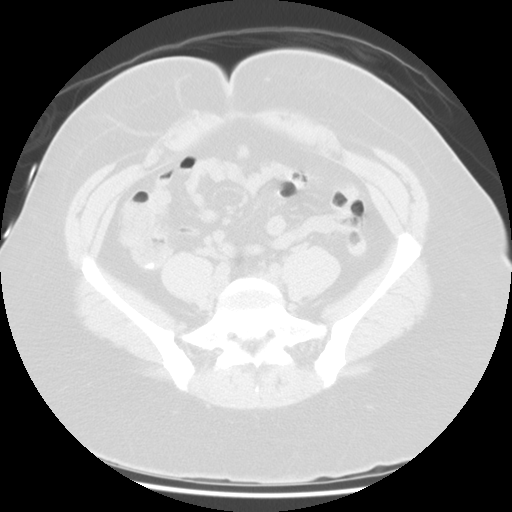
[im 43/85  soft-tissue]
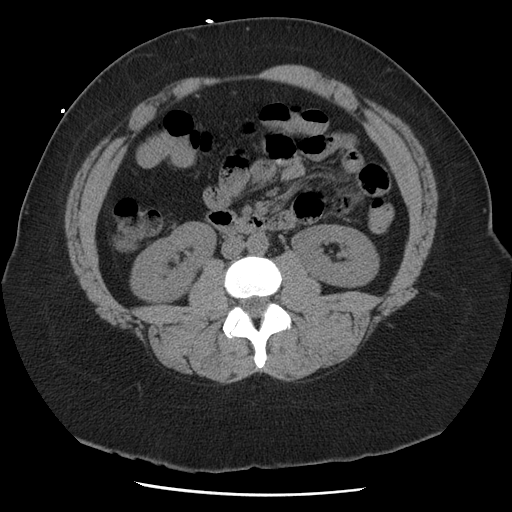
[im 43/85  lung]
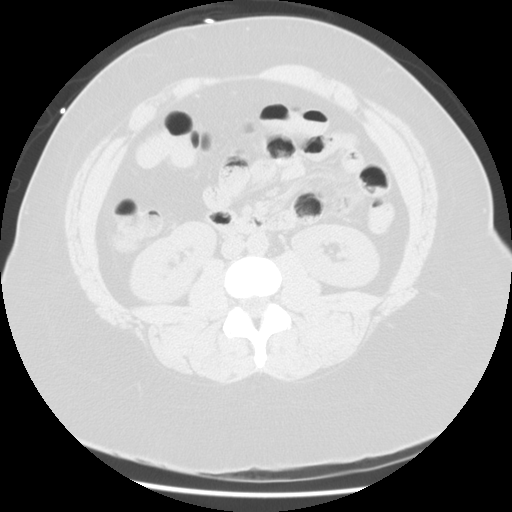
[im 57/85  soft-tissue]
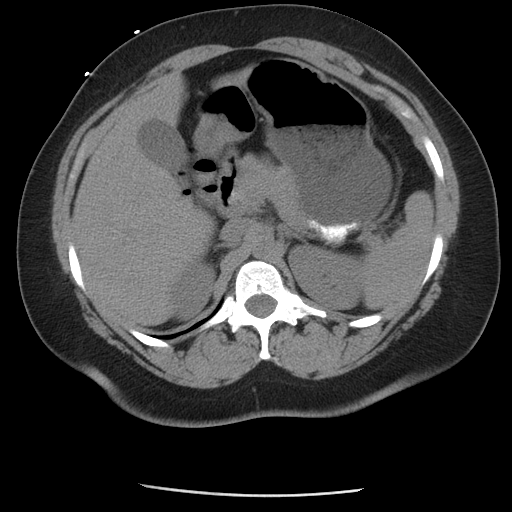
[im 57/85  lung]
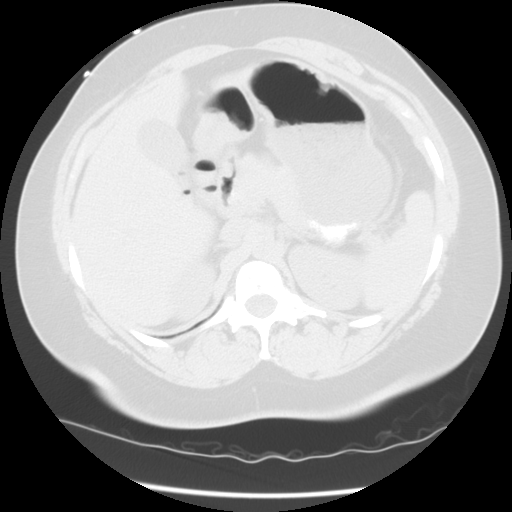
[im 71/85  soft-tissue]
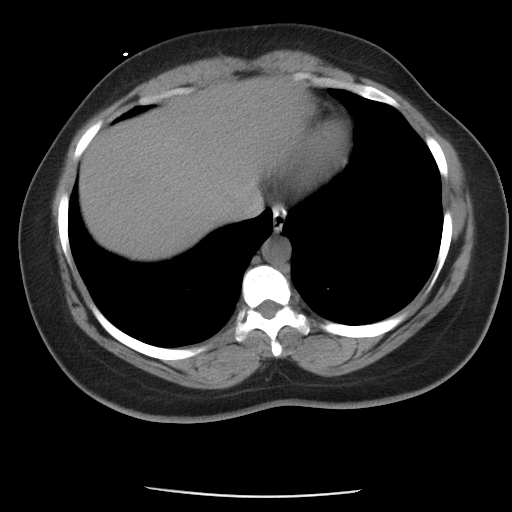
[im 71/85  lung]
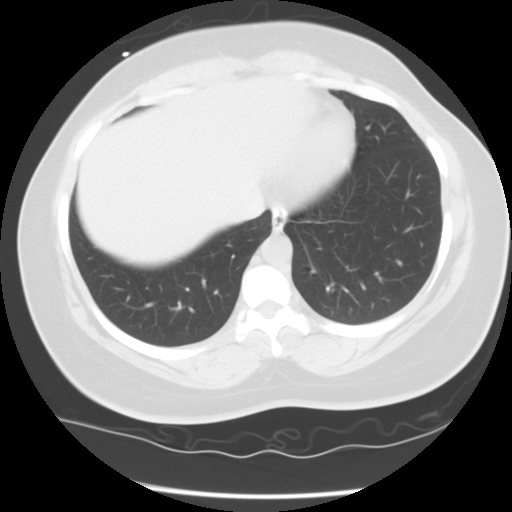

[Series 400: cor · coronal · 0.92mm/px · 8 of 112 slices shown]
[im 12/112  soft-tissue]
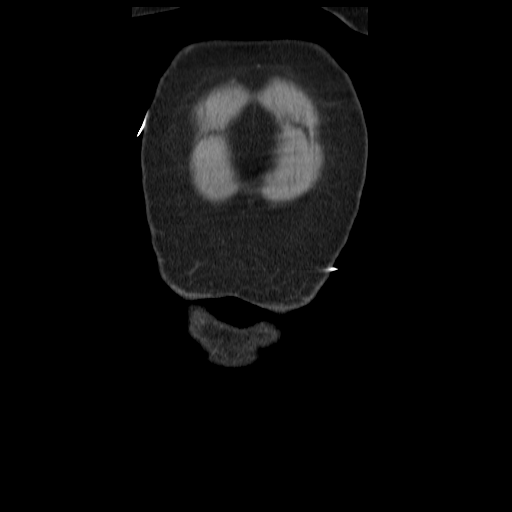
[im 23/112  soft-tissue]
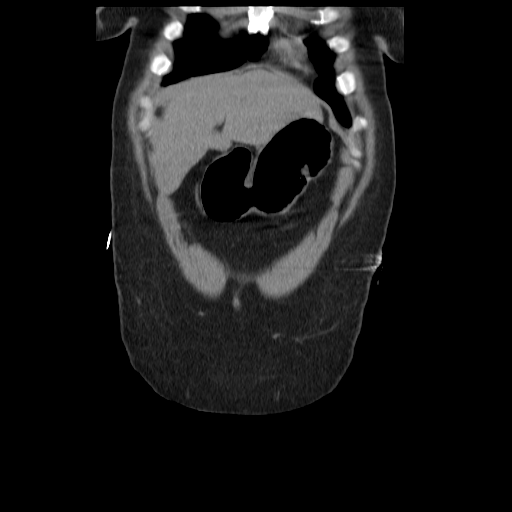
[im 34/112  soft-tissue]
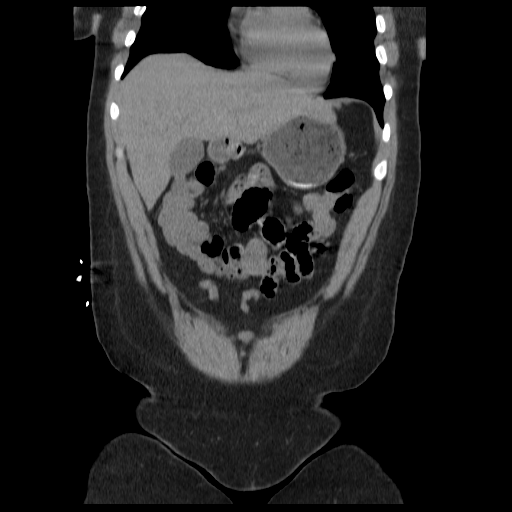
[im 45/112  soft-tissue]
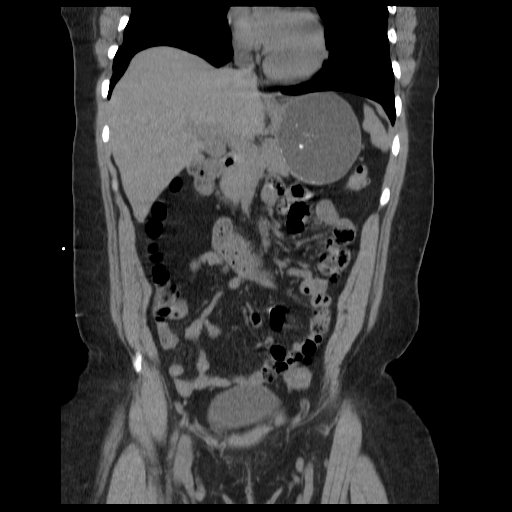
[im 67/112  soft-tissue]
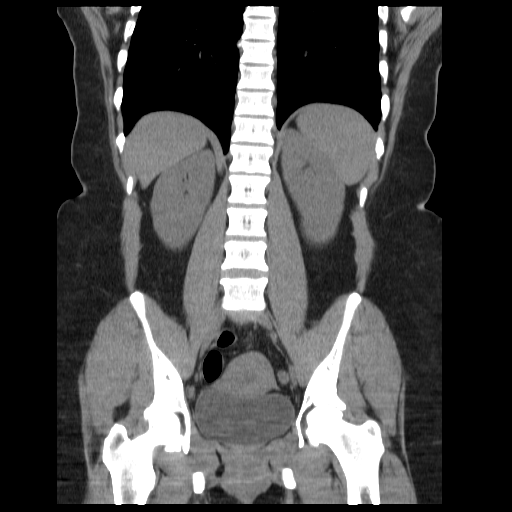
[im 78/112  soft-tissue]
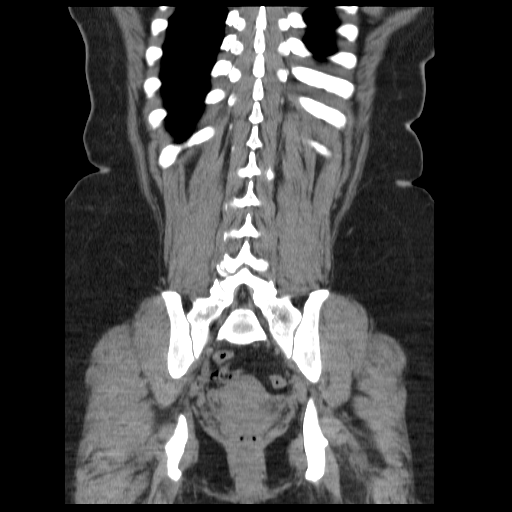
[im 89/112  soft-tissue]
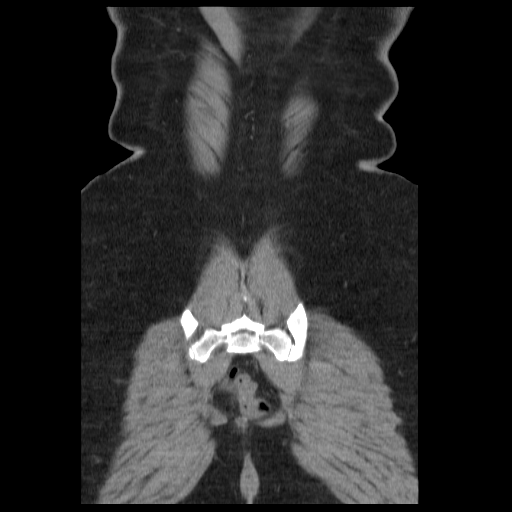
[im 100/112  soft-tissue]
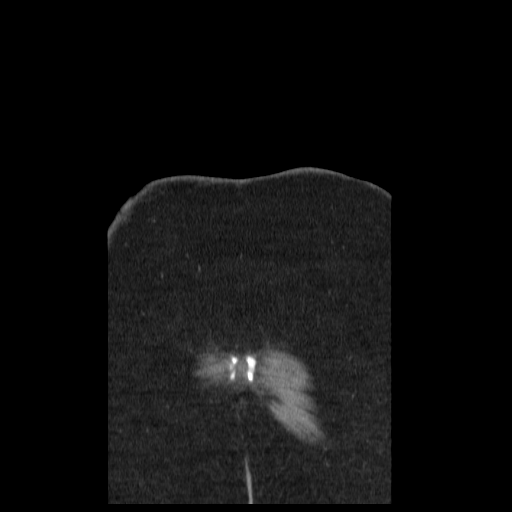

[13 of 32 positions shown; findings below may reference images not displayed]

FINDINGS: Lung bases clear.
No urinary tract calcification or dilatation.
Kidneys, ureters, and bladder grossly unremarkable for technique.
Normal appendix.
Within limits of a nonenhanced exam, liver, spleen, pancreas,
kidneys, and adrenal glands normal.
Scattered radiopacity is within stomach and bowel, question related
to radiodense medication.
Stomach and bowel loops otherwise grossly unremarkable.
Normal appearing uterus and adnexae.
No mass, adenopathy, free fluid, or inflammatory process.
No hernia or acute bony lesion.
IMPRESSION: No definite acute intra abdominal or intrapelvic abnormalities.

## 2013-03-15 ENCOUNTER — Telehealth: Payer: Self-pay | Admitting: Family Medicine

## 2013-03-15 NOTE — Telephone Encounter (Signed)
Pt has appt sch for 03-26-13. Please call amlodipine 5 mg #30 call into [facility] [address]

## 2013-03-18 MED ORDER — AMLODIPINE BESYLATE 5 MG PO TABS: 5.0000 mg | ORAL_TABLET | Freq: Every day | ORAL | Status: DC

## 2013-03-18 NOTE — Telephone Encounter (Signed)
RX sent to pharmacy  

## 2013-03-26 ENCOUNTER — Ambulatory Visit: Payer: BC Managed Care – PPO | Admitting: Family Medicine

## 2013-03-31 ENCOUNTER — Emergency Department (HOSPITAL_COMMUNITY)
Admission: EM | Admit: 2013-03-31 | Discharge: 2013-03-31 | Disposition: A | Discharge status: Home / Self Care | Payer: BC Managed Care – PPO | Attending: Emergency Medicine | Admitting: Emergency Medicine

## 2013-03-31 ENCOUNTER — Encounter (HOSPITAL_COMMUNITY): Payer: Self-pay | Admitting: Emergency Medicine

## 2013-03-31 DIAGNOSIS — Z862 Personal history of diseases of the blood and blood-forming organs and certain disorders involving the immune mechanism: Secondary | ICD-10-CM | POA: Insufficient documentation

## 2013-03-31 DIAGNOSIS — B9789 Other viral agents as the cause of diseases classified elsewhere: Secondary | ICD-10-CM | POA: Insufficient documentation

## 2013-03-31 DIAGNOSIS — R197 Diarrhea, unspecified: Secondary | ICD-10-CM | POA: Insufficient documentation

## 2013-03-31 DIAGNOSIS — Z79899 Other long term (current) drug therapy: Secondary | ICD-10-CM | POA: Insufficient documentation

## 2013-03-31 DIAGNOSIS — Z8619 Personal history of other infectious and parasitic diseases: Secondary | ICD-10-CM | POA: Insufficient documentation

## 2013-03-31 DIAGNOSIS — I10 Essential (primary) hypertension: Secondary | ICD-10-CM | POA: Insufficient documentation

## 2013-03-31 DIAGNOSIS — Z872 Personal history of diseases of the skin and subcutaneous tissue: Secondary | ICD-10-CM | POA: Insufficient documentation

## 2013-03-31 DIAGNOSIS — R110 Nausea: Secondary | ICD-10-CM | POA: Insufficient documentation

## 2013-03-31 DIAGNOSIS — Z87828 Personal history of other (healed) physical injury and trauma: Secondary | ICD-10-CM | POA: Insufficient documentation

## 2013-03-31 DIAGNOSIS — R109 Unspecified abdominal pain: Secondary | ICD-10-CM | POA: Insufficient documentation

## 2013-03-31 DIAGNOSIS — Z8659 Personal history of other mental and behavioral disorders: Secondary | ICD-10-CM | POA: Insufficient documentation

## 2013-03-31 DIAGNOSIS — B349 Viral infection, unspecified: Secondary | ICD-10-CM

## 2013-03-31 DIAGNOSIS — J069 Acute upper respiratory infection, unspecified: Secondary | ICD-10-CM | POA: Insufficient documentation

## 2013-03-31 MED ORDER — FLUTICASONE PROPIONATE 50 MCG/ACT NA SUSP: 2.0000 | Freq: Every day | NASAL | Status: DC

## 2013-03-31 MED ORDER — PSEUDOEPHEDRINE HCL 60 MG PO TABS: 60.0000 mg | ORAL_TABLET | ORAL | Status: DC | PRN

## 2013-03-31 MED ORDER — DESLORATADINE 5 MG PO TABS: 5.0000 mg | ORAL_TABLET | Freq: Every day | ORAL | Status: DC

## 2013-03-31 MED ORDER — ACETAMINOPHEN-CODEINE #3 300-30 MG PO TABS: 1.0000 | ORAL_TABLET | Freq: Four times a day (QID) | ORAL | Status: DC | PRN

## 2013-03-31 NOTE — ED Notes (Signed)
Pt alert and mentating appropriately upon d/c. NAD noted upon d/c. Pt ambulatory upon d/c. Pt given d/c teaching, prescriptions and follow up care instructions. Pt has no further questions upon d/c.

## 2013-03-31 NOTE — ED Notes (Signed)
Pt c/o HA x 3 days with URI sx with cough and congestion

## 2013-03-31 NOTE — ED Provider Notes (Signed)
Medical screening examination/treatment/procedure(s) were performed by non-physician practitioner and as supervising physician I was immediately available for consultation/collaboration.  EKG Interpretation   None        Geoffery Lyons, MD 03/31/13 1358

## 2013-03-31 NOTE — ED Notes (Signed)
Verified with Junious Silk, PA that pt could have note for work.

## 2013-03-31 NOTE — ED Provider Notes (Signed)
CSN: [id number]     Arrival date & time 03/31/13  7829 History   First MD Initiated Contact with Patient 03/31/13 [phone]     Chief Complaint  Patient presents with  . Headache  . URI   (Consider location/radiation/quality/duration/timing/severity/associated sxs/prior Treatment) HPI Comments: Patient is a 37 year old female with history of chlamydia, hypertension, depression presents today with worsening cold symptoms since Saturday. She reports that her symptoms began with a headache. The headache began gradually and feels like the patient's typical headaches. She reports it feels like a "tension". The headache is worse when she leans her head forward. She has been developing worsening cough, congestion, sinus pain and pressure. She has been sneezing more than normal. She also reports that she has some mild watery diarrhea. She has associated cramping abdominal pain just before she has the diarrhea, but it resolves after she has a bowel movement. She has some nausea without vomiting. She took a Tylenol which helped her headache. She has not taken any decongestants. She does not take any allergy medicine on a daily basis. She denies any fevers, body aches. She did not get flu shot this year. There is no one else sick around her.  The history is provided by the patient. No language interpreter was used.    Past Medical History  Diagnosis Date  . CHLAMYDIAL INFECTION 11/29/2009  . 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 DEPRESSION 10/20/2008  . HYPERTENSION 10/20/2008  . Polyuria 02/15/2010  . LUMBAR STRAIN, ACUTE 08/17/2009  . Sickle cell anemia     trait  . Herpes   . HYQMVHQI(696.2)    Past Surgical History  Procedure Laterality Date  . Cesarean section      X 3  . Tubal ligation     Family History  Problem Relation Age of Onset  . Arthritis Other   . Hypertension Other   . Depression Other    History  Substance Use Topics  . Smoking status: Never Smoker   . Smokeless tobacco: Never Used  .  Alcohol Use: No   OB History   Grav Para Term Preterm Abortions TAB SAB Ect Mult Living                 Review of Systems  Constitutional: Negative for fever and chills.  HENT: Positive for congestion and sore throat. Negative for ear pain and rhinorrhea.   Eyes: Negative for photophobia and visual disturbance.  Respiratory: Positive for cough. Negative for shortness of breath.   Gastrointestinal: Positive for nausea, abdominal pain and diarrhea. Negative for vomiting.  Skin: Negative for rash.  Neurological: Positive for headaches.  All other systems reviewed and are negative.    Allergies  Review of patient's allergies indicates no known allergies.  Home Medications   Current Outpatient Rx  Name  Route  Sig  Dispense  Refill  . acetaminophen (TYLENOL) 500 MG tablet   Oral   Take 500 mg by mouth once.         Marland Kitchen amLODipine (NORVASC) 5 MG tablet   Oral   Take 1 tablet (5 mg total) by mouth daily.   30 tablet   5    BP 168/90  Pulse 74  Temp(Src) 98.8 F (37.1 C) (Oral)  Resp 18  SpO2 98% Physical Exam  Nursing note and vitals reviewed. Constitutional: She is oriented to person, place, and time. She appears well-developed and well-nourished.  Non-toxic appearance. She does not have a sickly appearance. She does not appear ill. No  distress.  HENT:  Head: Normocephalic and atraumatic.  Right Ear: Tympanic membrane, external ear and ear canal normal.  Left Ear: Tympanic membrane, external ear and ear canal normal.  Nose: Right sinus exhibits maxillary sinus tenderness and frontal sinus tenderness. Left sinus exhibits maxillary sinus tenderness and frontal sinus tenderness.  Mouth/Throat: Uvula is midline and oropharynx is clear and moist.  Nasal turbinates swollen bilaterally. They are pale in appearance.  Eyes: Conjunctivae are normal.  Neck: Trachea normal, normal range of motion and phonation normal.  No nuchal rigidity or meningeal signs  Cardiovascular:  Normal rate, regular rhythm and normal heart sounds.   Pulmonary/Chest: Effort normal and breath sounds normal. No stridor. No respiratory distress. She has no wheezes. She has no rales.  Abdominal: Soft. She exhibits no distension.  Musculoskeletal: Normal range of motion.  Neurological: She is alert and oriented to person, place, and time. She has normal strength.  Skin: Skin is warm and dry. She is not diaphoretic. No erythema.  Psychiatric: She has a normal mood and affect. Her behavior is normal.    ED Course  Procedures (including critical care time) Labs Review Labs Reviewed - No data to display Imaging Review No results found.  EKG Interpretation   None       MDM   1. URI (upper respiratory infection)   2. Viral syndrome    Lungs CTA, oxygen saturation 98% on RA. Pt not tachycardic. Patients symptoms are consistent with URI, likely viral etiology. Discussed that antibiotics are not indicated for viral infections. Pt will be discharged with symptomatic treatment.  Verbalizes understanding and is agreeable with plan. Pt is hemodynamically stable & in NAD prior to dc. Return instructions given. Vital signs stable for discharge. Patient / Family / Caregiver informed of clinical course, understand medical decision-making process, and agree with plan.     Mora Bellman, PA-C 03/31/13 1005

## 2013-04-16 ENCOUNTER — Encounter: Payer: Self-pay | Admitting: Family Medicine

## 2013-04-16 ENCOUNTER — Ambulatory Visit (INDEPENDENT_AMBULATORY_CARE_PROVIDER_SITE_OTHER): Payer: BC Managed Care – PPO | Admitting: Family Medicine

## 2013-04-16 VITALS — BP 136/80 | HR 87 | Temp 98.3°F | Wt 216.0 lb

## 2013-04-16 DIAGNOSIS — E669 Obesity, unspecified: Secondary | ICD-10-CM | POA: Insufficient documentation

## 2013-04-16 DIAGNOSIS — I10 Essential (primary) hypertension: Secondary | ICD-10-CM

## 2013-04-16 MED ORDER — AMLODIPINE BESYLATE 5 MG PO TABS: 5.0000 mg | ORAL_TABLET | Freq: Every day | ORAL | Status: DC

## 2013-04-16 NOTE — Patient Instructions (Signed)

## 2013-04-16 NOTE — Progress Notes (Signed)
Pre visit review using our clinic review tool, if applicable. No additional management support is needed unless otherwise documented below in the visit note. 

## 2013-04-16 NOTE — Progress Notes (Signed)
   Subjective:    Patient ID: Jamie Allison, female    DOB: 1975-08-28, 37 y.o.   MRN: [id number]  HPI Patient seen for followup hypertension. She takes amlodipine 5 mg daily. She has history of obesity. Poor compliance with diet. No consistent exercise. Denies any recent dizziness or headaches. No chest pains. No peripheral edema issues. She has been compliant with therapy. Nonsmoker. Currently not taking any other medications.  Past Medical History  Diagnosis Date  . CHLAMYDIAL INFECTION 11/29/2009  . 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 DEPRESSION 10/20/2008  . HYPERTENSION 10/20/2008  . Polyuria 02/15/2010  . LUMBAR STRAIN, ACUTE 08/17/2009  . Sickle cell anemia     trait  . Herpes   . NWGNFAOZ(308.6)    Past Surgical History  Procedure Laterality Date  . Cesarean section      X 3  . Tubal ligation      reports that she has never smoked. She has never used smokeless tobacco. She reports that she does not drink alcohol or use illicit drugs. family history includes Arthritis in her other; Depression in her other; Hypertension in her other. No Known Allergies    Review of Systems  Constitutional: Negative for fatigue.  Eyes: Negative for visual disturbance.  Respiratory: Negative for cough, chest tightness, shortness of breath and wheezing.   Cardiovascular: Negative for chest pain, palpitations and leg swelling.  Neurological: Negative for dizziness, seizures, syncope, weakness, light-headedness and headaches.       Objective:   Physical Exam  Constitutional: She appears well-developed and well-nourished. No distress.  Neck: Neck supple. No thyromegaly present.  Cardiovascular: Normal rate and regular rhythm.   Pulmonary/Chest: Effort normal and breath sounds normal. No respiratory distress. She has no wheezes. She has no rales.  Musculoskeletal: She exhibits no edema.          Assessment & Plan:  Hypertension. Adequately controlled. We strongly suggested working on  some weight loss. Refill medication for one year. Reduce sodium intake. Routine followup 6 months. Flu vaccine offered and declined

## 2013-08-25 ENCOUNTER — Encounter: Payer: Self-pay | Admitting: Family Medicine

## 2013-08-25 ENCOUNTER — Ambulatory Visit (INDEPENDENT_AMBULATORY_CARE_PROVIDER_SITE_OTHER): Payer: BC Managed Care – PPO | Admitting: Family Medicine

## 2013-08-25 VITALS — BP 132/88 | HR 73 | Temp 98.1°F | Wt 222.0 lb

## 2013-08-25 DIAGNOSIS — J029 Acute pharyngitis, unspecified: Secondary | ICD-10-CM

## 2013-08-25 MED ORDER — IBUPROFEN 800 MG PO TABS: 800.0000 mg | ORAL_TABLET | Freq: Three times a day (TID) | ORAL | Status: DC | PRN

## 2013-08-25 NOTE — Patient Instructions (Signed)
Pharyngitis °Pharyngitis is redness, pain, and swelling (inflammation) of your pharynx.  °CAUSES  °Pharyngitis is usually caused by infection. Most of the time, these infections are from viruses (viral) and are part of a cold. However, sometimes pharyngitis is caused by bacteria (bacterial). Pharyngitis can also be caused by allergies. Viral pharyngitis may be spread from person to person by coughing, sneezing, and personal items or utensils (cups, forks, spoons, toothbrushes). Bacterial pharyngitis may be spread from person to person by more intimate contact, such as kissing.  °SIGNS AND SYMPTOMS  °Symptoms of pharyngitis include:   °· Sore throat.   °· Tiredness (fatigue).   °· Low-grade fever.   °· Headache. °· Joint pain and muscle aches. °· Skin rashes. °· Swollen lymph nodes. °· Plaque-like film on throat or tonsils (often seen with bacterial pharyngitis). °DIAGNOSIS  °Your health care provider will ask you questions about your illness and your symptoms. Your medical history, along with a physical exam, is often all that is needed to diagnose pharyngitis. Sometimes, a rapid strep test is done. Other lab tests may also be done, depending on the suspected cause.  °TREATMENT  °Viral pharyngitis will usually get better in 3 4 days without the use of medicine. Bacterial pharyngitis is treated with medicines that kill germs (antibiotics).  °HOME CARE INSTRUCTIONS  °· Drink enough water and fluids to keep your urine clear or pale yellow.   °· Only take over-the-counter or prescription medicines as directed by your health care provider:   °· If you are prescribed antibiotics, make sure you finish them even if you start to feel better.   °· Do not take aspirin.   °· Get lots of rest.   °· Gargle with 8 oz of salt water (½ tsp of salt per 1 qt of water) as often as every 1 2 hours to soothe your throat.   °· Throat lozenges (if you are not at risk for choking) or sprays may be used to soothe your throat. °SEEK MEDICAL  CARE IF:  °· You have large, tender lumps in your neck. °· You have a rash. °· You cough up green, yellow-brown, or bloody spit. °SEEK IMMEDIATE MEDICAL CARE IF:  °· Your neck becomes stiff. °· You drool or are unable to swallow liquids. °· You vomit or are unable to keep medicines or liquids down. °· You have severe pain that does not go away with the use of recommended medicines. °· You have trouble breathing (not caused by a stuffy nose). °MAKE SURE YOU:  °· Understand these instructions. °· Will watch your condition. °· Will get help right away if you are not doing well or get worse. °Document Released: 05/06/2005 Document Revised: 02/24/2013 Document Reviewed: 01/11/2013 °[employer]® Patient Information ©2014 [employer]. ° °

## 2013-08-25 NOTE — Progress Notes (Signed)
   Subjective:    Patient ID: Jamie HutchingShewanda L Schall, female    DOB: [birth year], 38 y.o.   MRN: [id number]  Sore Throat  Associated symptoms include coughing. Pertinent negatives include no ear pain, shortness of breath or trouble swallowing.   Acute visit. Patient presents with sore throat with onset this past Monday. She has a dry cough. No nasal congestion. Denies any fever or chills. She's tried saltwater gargles which have helped slightly. She has not taken any medications. She's had decreased appetite. No nausea or vomiting.  She has history of enlarged tonsils. She had sleep study 2013 which did not reveal any major obstructive sleep apnea. She does snore frequently.  Past Medical History  Diagnosis Date  . CHLAMYDIAL INFECTION 11/29/2009  . 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 DEPRESSION 10/20/2008  . HYPERTENSION 10/20/2008  . Polyuria 02/15/2010  . LUMBAR STRAIN, ACUTE 08/17/2009  . Sickle cell anemia     trait  . Herpes   . JYNWGNFA(213.0Headache(784.0)    Past Surgical History  Procedure Laterality Date  . Cesarean section      X 3  . Tubal ligation      reports that she has never smoked. She has never used smokeless tobacco. She reports that she does not drink alcohol or use illicit drugs. family history includes Arthritis in her other; Depression in her other; Hypertension in her other. No Known Allergies    Review of Systems  Constitutional: Positive for appetite change and fatigue. Negative for fever and chills.  HENT: Positive for sore throat. Negative for ear pain and trouble swallowing.   Respiratory: Positive for cough. Negative for shortness of breath.   Cardiovascular: Negative for chest pain.       Objective:   Physical Exam  Constitutional: She appears well-developed and well-nourished.  HENT:  Right Ear: External ear normal.  Left Ear: External ear normal.  Tonsils are symmetrically enlarged but no erythema and no exudate  Neck: Neck supple. No thyromegaly present.    Cardiovascular: Normal rate.   Pulmonary/Chest: Effort normal and breath sounds normal. No respiratory distress. She has no wheezes. She has no rales.  Lymphadenopathy:    She has no cervical adenopathy.          Assessment & Plan:  Pharyngitis. Suspect viral. Rapid strep negative. Treat symptomatically. Motrin 800 mg every 8 hours as needed for sore throat symptoms. Stay well hydrated. Work note written for today through April 12

## 2013-08-25 NOTE — Progress Notes (Signed)
Pre visit review using our clinic review tool, if applicable. No additional management support is needed unless otherwise documented below in the visit note. 

## 2014-01-26 ENCOUNTER — Encounter (HOSPITAL_COMMUNITY): Payer: Self-pay | Admitting: Emergency Medicine

## 2014-01-26 ENCOUNTER — Emergency Department (HOSPITAL_COMMUNITY): Payer: BC Managed Care – PPO

## 2014-01-26 ENCOUNTER — Ambulatory Visit: Payer: BC Managed Care – PPO | Admitting: Family Medicine

## 2014-01-26 ENCOUNTER — Emergency Department (HOSPITAL_COMMUNITY)
Admission: EM | Admit: 2014-01-26 | Discharge: 2014-01-26 | Disposition: A | Discharge status: Home / Self Care | Payer: BC Managed Care – PPO | Attending: Emergency Medicine | Admitting: Emergency Medicine

## 2014-01-26 DIAGNOSIS — M79602 Pain in left arm: Secondary | ICD-10-CM

## 2014-01-26 DIAGNOSIS — Z79899 Other long term (current) drug therapy: Secondary | ICD-10-CM | POA: Insufficient documentation

## 2014-01-26 DIAGNOSIS — Z3202 Encounter for pregnancy test, result negative: Secondary | ICD-10-CM | POA: Diagnosis not present

## 2014-01-26 DIAGNOSIS — Z8619 Personal history of other infectious and parasitic diseases: Secondary | ICD-10-CM | POA: Insufficient documentation

## 2014-01-26 DIAGNOSIS — Z862 Personal history of diseases of the blood and blood-forming organs and certain disorders involving the immune mechanism: Secondary | ICD-10-CM | POA: Diagnosis not present

## 2014-01-26 DIAGNOSIS — M79609 Pain in unspecified limb: Secondary | ICD-10-CM

## 2014-01-26 DIAGNOSIS — I10 Essential (primary) hypertension: Secondary | ICD-10-CM | POA: Insufficient documentation

## 2014-01-26 DIAGNOSIS — E669 Obesity, unspecified: Secondary | ICD-10-CM | POA: Diagnosis not present

## 2014-01-26 DIAGNOSIS — Z8659 Personal history of other mental and behavioral disorders: Secondary | ICD-10-CM | POA: Insufficient documentation

## 2014-01-26 DIAGNOSIS — M79605 Pain in left leg: Secondary | ICD-10-CM

## 2014-01-26 DIAGNOSIS — R0602 Shortness of breath: Secondary | ICD-10-CM | POA: Insufficient documentation

## 2014-01-26 LAB — URINALYSIS, ROUTINE W REFLEX MICROSCOPIC
BILIRUBIN URINE: NEGATIVE
Glucose, UA: NEGATIVE mg/dL
Ketones, ur: NEGATIVE mg/dL
Leukocytes, UA: NEGATIVE
NITRITE: NEGATIVE
PH: 6.5 (ref 5.0–8.0)
Protein, ur: NEGATIVE mg/dL
SPECIFIC GRAVITY, URINE: 1.013 (ref 1.005–1.030)
Urobilinogen, UA: 0.2 mg/dL (ref 0.0–1.0)

## 2014-01-26 LAB — CBC WITH DIFFERENTIAL/PLATELET
BASOS ABS: 0 10*3/uL (ref 0.0–0.1)
BASOS PCT: 0 % (ref 0–1)
EOS ABS: 0.1 10*3/uL (ref 0.0–0.7)
EOS PCT: 1 % (ref 0–5)
HCT: 33.6 % — ABNORMAL LOW (ref 36.0–46.0)
Hemoglobin: 11.4 g/dL — ABNORMAL LOW (ref 12.0–15.0)
Lymphocytes Relative: 38 % (ref 12–46)
Lymphs Abs: 2.5 10*3/uL (ref 0.7–4.0)
MCH: 27.1 pg (ref 26.0–34.0)
MCHC: 33.9 g/dL (ref 30.0–36.0)
MCV: 79.8 fL (ref 78.0–100.0)
Monocytes Absolute: 0.4 10*3/uL (ref 0.1–1.0)
Monocytes Relative: 7 % (ref 3–12)
Neutro Abs: 3.5 10*3/uL (ref 1.7–7.7)
Neutrophils Relative %: 54 % (ref 43–77)
PLATELETS: 377 10*3/uL (ref 150–400)
RBC: 4.21 MIL/uL (ref 3.87–5.11)
RDW: 14.3 % (ref 11.5–15.5)
WBC: 6.5 10*3/uL (ref 4.0–10.5)

## 2014-01-26 LAB — COMPREHENSIVE METABOLIC PANEL
ALBUMIN: 3.7 g/dL (ref 3.5–5.2)
ALT: 9 U/L (ref 0–35)
AST: 11 U/L (ref 0–37)
Alkaline Phosphatase: 58 U/L (ref 39–117)
Anion gap: 13 (ref 5–15)
BUN: 7 mg/dL (ref 6–23)
CALCIUM: 9 mg/dL (ref 8.4–10.5)
CO2: 25 mEq/L (ref 19–32)
Chloride: 102 mEq/L (ref 96–112)
Creatinine, Ser: 0.69 mg/dL (ref 0.50–1.10)
GFR calc non Af Amer: >90 mL/min (ref >90)
Glucose, Bld: 90 mg/dL (ref 70–99)
Potassium: 3.2 mEq/L — ABNORMAL LOW (ref 3.7–5.3)
SODIUM: 140 meq/L (ref 137–147)
TOTAL PROTEIN: 7 g/dL (ref 6.0–8.3)
Total Bilirubin: 0.4 mg/dL (ref 0.3–1.2)

## 2014-01-26 LAB — URINE MICROSCOPIC-ADD ON

## 2014-01-26 LAB — RETICULOCYTES
RBC.: 4.21 MIL/uL (ref 3.87–5.11)
RETIC CT PCT: 1.6 % (ref 0.4–3.1)
Retic Count, Absolute: 67.4 10*3/uL (ref 19.0–186.0)

## 2014-01-26 LAB — D-DIMER, QUANTITATIVE (NOT AT ARMC): D DIMER QUANT: 0.42 ug{FEU}/mL (ref 0.00–0.48)

## 2014-01-26 LAB — TROPONIN I: Troponin I: <0.3 ng/mL (ref <0.30)

## 2014-01-26 LAB — POC URINE PREG, ED: PREG TEST UR: NEGATIVE

## 2014-01-26 MED ORDER — SODIUM CHLORIDE 0.9 % IV BOLUS (SEPSIS)
500.0000 mL | Freq: Once | INTRAVENOUS | Status: AC
  Administered 2014-01-26: 500 mL via INTRAVENOUS

## 2014-01-26 MED ORDER — SODIUM CHLORIDE 0.9 % IV BOLUS (SEPSIS)
1000.0000 mL | Freq: Once | INTRAVENOUS | Status: AC
  Administered 2014-01-26: 1000 mL via INTRAVENOUS

## 2014-01-26 MED ORDER — MORPHINE SULFATE 4 MG/ML IJ SOLN
4.0000 mg | Freq: Once | INTRAMUSCULAR | Status: AC
  Administered 2014-01-26: 4 mg via INTRAVENOUS
  Filled 2014-01-26: qty 1

## 2014-01-26 NOTE — ED Notes (Signed)
Pt remains monitored by blood pressure, pulse ox, and 5 lead. Pts family remains at bedside.  

## 2014-01-26 NOTE — ED Provider Notes (Signed)
Here with left arm pain.  For 2 days.  Now with left hip pain and swelling.  EKG negative.  Retic wnls. CXR wnl. Not acute chest.  Dopplers ordered. D dimer.  TTP to left arm, no truama. No erythema, doubt cellulitis.  If negative, home.    Labs Reviewed  CBC WITH DIFFERENTIAL - Abnormal; Notable for the following:    Hemoglobin 11.4 (*)    HCT 33.6 (*)    All other components within normal limits  COMPREHENSIVE METABOLIC PANEL - Abnormal; Notable for the following:    Potassium 3.2 (*)    All other components within normal limits  URINALYSIS, ROUTINE W REFLEX MICROSCOPIC - Abnormal; Notable for the following:    Hgb urine dipstick TRACE (*)    All other components within normal limits  URINE MICROSCOPIC-ADD ON - Abnormal; Notable for the following:    Squamous Epithelial / LPF FEW (*)    All other components within normal limits  RETICULOCYTES  TROPONIN I  D-DIMER, QUANTITATIVE  POC URINE PREG, ED   Medications  sodium chloride 0.9 % bolus 1,000 mL (1,000 mLs Intravenous New Bag/Given 01/26/14 1404)  morphine 4 MG/ML injection 4 mg (4 mg Intravenous Given 01/26/14 1449)   DG CHEST 2 VIEW   Final Result:      CT HEAD W/O CONTRAST    (Results Pending)     6:08 PM DVT study neg. Pain improved. Stbale for d/c. Strict return precautions discussed.  Sofie Rower, MD 01/26/14 [phone]

## 2014-01-26 NOTE — Progress Notes (Signed)
*  Preliminary Results* Left upper extremity venous duplex completed. Left upper extremity is negative for deep and superficial vein thrombosis.   Left lower extremity venous duplex completed. Left lower extremity is negative for deep vein thrombosis. There is no evidence of left Baker's cyst.   01/26/2014 5:18 PM  Gertie Fey, RVT, RDCS, RDMS

## 2014-01-26 NOTE — ED Notes (Signed)
Pt placed on monitor upon return to room from CT. Pt  Continues to be monitored by blood pressure, pulse ox, and 5 lead. Pts family remains at bedside.

## 2014-01-26 NOTE — ED Notes (Signed)
Brought pt back to room via wheelchair with family in tow; myself and Hope, RN assisted pt from wheelchair to stretcher and then with removing of her shirt and bra to place on a gown; pt getting placed on monitor, continuous pulse oximetry and blood pressure cuff

## 2014-01-26 NOTE — ED Notes (Signed)
Pt placed on monitor upon return to room from restroom. Pt continues to be monitored by blood pressure,  Pulse ox, and 5 lead. Pts family remains at bedside.

## 2014-01-26 NOTE — ED Provider Notes (Signed)
CSN: [id number]     Arrival date & time 01/26/14  1023 History   First MD Initiated Contact with Patient 01/26/14 1257     Chief Complaint  Patient presents with  . Arm Pain  . Leg Pain     (Consider location/radiation/quality/duration/timing/severity/associated sxs/prior Treatment) The history is provided by the patient. No language interpreter was used.  Jamie Allison is a 38 y/o female with past medical history of hypertension, depression, sickle cell anemia presenting to the ED with left-sided discomfort is been ongoing for the past couple of days. Patient reports that the pain started in her left arm described as a constant dull aching sensation with radiation from the shoulder down to hand. Stated that this morning the pain began in her left leg described as a dull, aching sensation radiating down from her left hip down to her left foot. Stated the pain has been constant. Reported that she's been experiencing shortness of breath today. Patient is a cleaning lady. Denied head injury, loss of consciousness, fainting, chest pain, difficulty breathing, Q. chest syndrome, history of blood clots, blurred vision, sudden loss of vision, dizziness, nausea, vomiting, difficulty swallowing, neck pain. PCP Dr. Caryl Never  Past Medical History  Diagnosis Date  . CHLAMYDIAL INFECTION 11/29/2009  . 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 DEPRESSION 10/20/2008  . HYPERTENSION 10/20/2008  . Polyuria 02/15/2010  . LUMBAR STRAIN, ACUTE 08/17/2009  . Sickle cell anemia     trait  . Herpes   . WUJWJXBJ(478.2)    Past Surgical History  Procedure Laterality Date  . Cesarean section      X 3  . Tubal ligation     Family History  Problem Relation Age of Onset  . Arthritis Other   . Hypertension Other   . Depression Other    History  Substance Use Topics  . Smoking status: Never Smoker   . Smokeless tobacco: Never Used  . Alcohol Use: No   OB History   Grav Para Term Preterm Abortions TAB SAB Ect Mult  Living                 Review of Systems  Constitutional: Negative for fever and chills.  Respiratory: Positive for shortness of breath. Negative for chest tightness.   Cardiovascular: Negative for chest pain.  Musculoskeletal: Positive for arthralgias (Left upper and lower extremity). Negative for neck pain.  Neurological: Negative for dizziness, numbness and headaches.      Allergies  Review of patient's allergies indicates no known allergies.  Home Medications   Prior to Admission medications   Medication Sig Start Date End Date Taking? Authorizing Provider  amLODipine (NORVASC) 5 MG tablet Take 1 tablet (5 mg total) by mouth daily. 04/16/13  Yes Kristian Covey, MD   BP 148/77  Pulse 58  Temp(Src) 98.4 F (36.9 C) (Oral)  Resp 15  SpO2 100%  LMP 12/29/2013 Physical Exam  Nursing note and vitals reviewed. Constitutional: She is oriented to person, place, and time. She appears well-developed and well-nourished. No distress.  HENT:  Head: Normocephalic and atraumatic.  Eyes: Conjunctivae and EOM are normal. Right eye exhibits no discharge. Left eye exhibits no discharge.  Neck: Normal range of motion. Neck supple. No tracheal deviation present.  Cardiovascular: Normal rate, regular rhythm and normal heart sounds.  Exam reveals no friction rub.   No murmur heard. Cap refill less than 3 seconds Negative swelling or pitting edema identified to the lower extremities bilaterally  Pulmonary/Chest: Effort normal and breath  sounds normal. No respiratory distress. She has no wheezes. She has no rales.  Musculoskeletal: Normal range of motion. She exhibits tenderness.       Arms:      Legs: Negative deformities, swelling, erythema, inflammation, lesions, sores, warmth upon palpation, malalignment identified to the upper and lower extremities bilaterally. Discomfort upon palpation to the left upper and left lower extremity bilaterally-circumferentially. Full range of motion  identified with negative difficulty. Patient is able to produce a fist without difficulty.  Lymphadenopathy:    She has no cervical adenopathy.  Neurological: She is alert and oriented to person, place, and time. No cranial nerve deficit. She exhibits normal muscle tone. Coordination normal.  Cranial nerves III-XII grossly intact Strength 5+/5+ to upper and lower extremities bilaterally with resistance applied, equal distribution noted Equal grip strength bilaterally Strength intact to MCP, PIP, DIP joints of bilateral hands Sensation intact with differentiation to sharp and dull touch Negative facial drooping Negative slurred speech Negative aphasia GCS 15 Patient is able to respond to questions appropriately Patient follows commands well Gait proper with negative step-offs or sway - negative ataxia noted Negative arm drift Fine motor skills intact  Skin: Skin is warm and dry. No rash noted. She is not diaphoretic. No erythema.  Psychiatric: She has a normal mood and affect. Her behavior is normal. Thought content normal.    ED Course  Procedures (including critical care time)  Labs Review Labs Reviewed  CBC WITH DIFFERENTIAL - Abnormal; Notable for the following:    Hemoglobin 11.4 (*)    HCT 33.6 (*)    All other components within normal limits  COMPREHENSIVE METABOLIC PANEL - Abnormal; Notable for the following:    Potassium 3.2 (*)    All other components within normal limits  RETICULOCYTES  TROPONIN I  URINALYSIS, ROUTINE W REFLEX MICROSCOPIC  POC URINE PREG, ED    Imaging Review Dg Chest 2 View  01/26/2014   CLINICAL DATA:  Arm and leg pain and swelling.  EXAM: CHEST  2 VIEW  COMPARISON:  10/10/2010.  FINDINGS: The heart size and mediastinal contours are within normal limits. Both lungs are clear. The visualized skeletal structures are unremarkable.  IMPRESSION: No active cardiopulmonary disease.   Electronically Signed   By: Kennith Center M.D.   On: 01/26/2014 14:44       EKG Interpretation   Date/Time:  Wednesday January 26 2014 13:47:07 EDT Ventricular Rate:  51 PR Interval:  202 QRS Duration: 106 QT Interval:  431 QTC Calculation: 397 R Axis:   74 Text Interpretation:  Sinus arrhythmia Borderline prolonged PR interval  Confirmed by Wilkie Aye  MD, COURTNEY ([id number]) on 01/26/2014 3:46:30 PM      MDM   Final diagnoses:  None    Medications  sodium chloride 0.9 % bolus 1,000 mL (1,000 mLs Intravenous New Bag/Given 01/26/14 1404)  morphine 4 MG/ML injection 4 mg (4 mg Intravenous Given 01/26/14 1449)    Filed Vitals:   01/26/14 1500 01/26/14 1515 01/26/14 1516 01/26/14 1600  BP: 129/68 133/61 133/61 148/77  Pulse: 51 48 51 58  Temp:      TempSrc:      Resp: SpO2: 100% 99% 99% 100%    EKG sinus arrhythmia with a borderline prolonged PR with a heart rate of 51 beats per minute. Troponin negative elevation. CBC unremarkable. CMP noted mildly low potassium of 3.2. Reticulocyte unremarkable. Urine pregnancy negative. Urinalysis pending. Chest xray negative for acute cardiopulmonary disease. CT  head negative for acute intracranial abnormalities.  UA pending. D-dimer and doppler pending - possible blood clot. Discussed case with resident, Dr. Sofie Rower at change in shift. Transfer of care to Dr. Sofie Rower at change in shift.   Raymon Mutton, PA-C 01/26/14 1701  [address] PA-C 01/26/14 1853

## 2014-01-26 NOTE — ED Notes (Signed)
Pt c/o left arm and leg pain x several days; pt denies obvious trauma

## 2014-01-26 NOTE — ED Notes (Signed)
Pt states she is feeling much better. States she just needs to go home and rest

## 2014-01-26 NOTE — ED Notes (Signed)
Pt placed on monitor upon arrival to room. Pt continues to be monitored by blood pressure, pulse ox, and 5 lead. Pts family remains at bedside.

## 2014-01-27 NOTE — ED Provider Notes (Signed)
Medical screening examination/treatment/procedure(s) were performed by non-physician practitioner and as supervising physician I was immediately available for consultation/collaboration.   EKG Interpretation   Date/Time:  Wednesday January 26 2014 13:47:07 EDT Ventricular Rate:  51 PR Interval:  202 QRS Duration: 106 QT Interval:  431 QTC Calculation: 397 R Axis:   74 Text Interpretation:  Sinus arrhythmia Borderline prolonged PR interval  Confirmed by Wilkie Aye  MD, COURTNEY ([id number]) on 01/26/2014 3:46:30 PM        Shon Baton, MD 01/27/14 [id number]

## 2014-02-19 NOTE — ED Provider Notes (Signed)
Medical screening examination/treatment/procedure(s) were performed by non-physician practitioner and as supervising physician I was immediately available for consultation/collaboration.   EKG Interpretation   Date/Time:  Wednesday January 26 2014 13:47:07 EDT Ventricular Rate:  51 PR Interval:  202 QRS Duration: 106 QT Interval:  431 QTC Calculation: 397 R Axis:   74 Text Interpretation:  Sinus arrhythmia Borderline prolonged PR interval  Confirmed by Wilkie AyeHORTON  MD,  ([id number]) on 01/26/2014 3:46:30 PM        Shon Batonourtney F , MD 02/19/14 1452

## 2014-04-20 ENCOUNTER — Encounter (HOSPITAL_COMMUNITY): Payer: Self-pay | Admitting: Emergency Medicine

## 2014-04-20 ENCOUNTER — Emergency Department (HOSPITAL_COMMUNITY)
Admission: EM | Admit: 2014-04-20 | Discharge: 2014-04-20 | Disposition: A | Discharge status: Home / Self Care | Payer: BC Managed Care – PPO | Attending: Emergency Medicine | Admitting: Emergency Medicine

## 2014-04-20 DIAGNOSIS — Z8619 Personal history of other infectious and parasitic diseases: Secondary | ICD-10-CM | POA: Insufficient documentation

## 2014-04-20 DIAGNOSIS — J029 Acute pharyngitis, unspecified: Secondary | ICD-10-CM | POA: Diagnosis not present

## 2014-04-20 DIAGNOSIS — R079 Chest pain, unspecified: Secondary | ICD-10-CM | POA: Diagnosis not present

## 2014-04-20 DIAGNOSIS — E668 Other obesity: Secondary | ICD-10-CM | POA: Diagnosis not present

## 2014-04-20 DIAGNOSIS — I10 Essential (primary) hypertension: Secondary | ICD-10-CM | POA: Insufficient documentation

## 2014-04-20 DIAGNOSIS — Z87828 Personal history of other (healed) physical injury and trauma: Secondary | ICD-10-CM | POA: Insufficient documentation

## 2014-04-20 DIAGNOSIS — Z8659 Personal history of other mental and behavioral disorders: Secondary | ICD-10-CM | POA: Diagnosis not present

## 2014-04-20 DIAGNOSIS — Z79899 Other long term (current) drug therapy: Secondary | ICD-10-CM | POA: Insufficient documentation

## 2014-04-20 DIAGNOSIS — R05 Cough: Secondary | ICD-10-CM | POA: Insufficient documentation

## 2014-04-20 DIAGNOSIS — Z862 Personal history of diseases of the blood and blood-forming organs and certain disorders involving the immune mechanism: Secondary | ICD-10-CM | POA: Diagnosis not present

## 2014-04-20 MED ORDER — PREDNISONE 20 MG PO TABS
60.0000 mg | ORAL_TABLET | Freq: Once | ORAL | Status: AC
  Administered 2014-04-20: 60 mg via ORAL
  Filled 2014-04-20: qty 3

## 2014-04-20 MED ORDER — OXYCODONE-ACETAMINOPHEN 5-325 MG PO TABS: 1.0000 | ORAL_TABLET | Freq: Four times a day (QID) | ORAL | Status: DC | PRN

## 2014-04-20 MED ORDER — AMOXICILLIN 500 MG PO CAPS
500.0000 mg | ORAL_CAPSULE | Freq: Once | ORAL | Status: AC
  Administered 2014-04-20: 500 mg via ORAL
  Filled 2014-04-20: qty 1

## 2014-04-20 MED ORDER — PREDNISONE 20 MG PO TABS: 40.0000 mg | ORAL_TABLET | Freq: Every day | ORAL | Status: DC

## 2014-04-20 MED ORDER — AMOXICILLIN 500 MG PO CAPS: 500.0000 mg | ORAL_CAPSULE | Freq: Two times a day (BID) | ORAL | Status: DC

## 2014-04-20 NOTE — Discharge Instructions (Signed)

## 2014-04-20 NOTE — ED Provider Notes (Signed)
CSN: [id number]     Arrival date & time 04/20/14  0227 History  This chart was scribed for American Expressathan R. Rubin PayorPickering, MD by Evon Slackerrance Branch, ED Scribe. This patient was seen in room B14C/B14C and the patient's care was started at 3:27 AM.    Chief Complaint  Patient presents with  . Sore Throat   Patient is a 38 y.o. female presenting with pharyngitis. The history is provided by the patient. No language interpreter was used.  Sore Throat Associated symptoms include chest pain. Pertinent negatives include no headaches.   HPI Comments: Jamie Allison is a 38 y.o. female who presents to the Emergency Department complaining of sore throat onset 1 week ago. Pt states she has associated productive cough mostly dry, nasal congestion and slight rhinorrhea. She states that she has some chest pain brought on from coughing.  She states she has tried salt water gargles and "throat sprays" with no relief.  She denies any recent sick contacts. Pt denies fever, chills, HA or myalgias.    Past Medical History  Diagnosis Date  . CHLAMYDIAL INFECTION 11/29/2009  . 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 DEPRESSION 10/20/2008  . HYPERTENSION 10/20/2008  . Polyuria 02/15/2010  . LUMBAR STRAIN, ACUTE 08/17/2009  . Sickle cell anemia     trait  . Herpes   . WUJWJXBJ(478.2Headache(784.0)    Past Surgical History  Procedure Laterality Date  . Cesarean section      X 3  . Tubal ligation     Family History  Problem Relation Age of Onset  . Arthritis Other   . Hypertension Other   . Depression Other    History  Substance Use Topics  . Smoking status: Never Smoker   . Smokeless tobacco: Never Used  . Alcohol Use: No   OB History    No data available     Review of Systems  Constitutional: Negative for fever and chills.  HENT: Positive for congestion, rhinorrhea and sore throat.   Respiratory: Positive for cough.   Cardiovascular: Positive for chest pain.  Musculoskeletal: Negative for myalgias.  Neurological: Negative for  headaches.    Allergies  Review of patient's allergies indicates no known allergies.  Home Medications   Prior to Admission medications   Medication Sig Start Date End Date Taking? Authorizing Provider  amLODipine (NORVASC) 5 MG tablet Take 1 tablet (5 mg total) by mouth daily. 04/16/13  Yes Kristian CoveyBruce W Burchette, MD   BP 154/71 mmHg  Pulse 83  Temp(Src) 97.9 F (36.6 C) (Oral)  Resp 17  Ht 5\' 4"  (1.626 m)  Wt 200 lb (90.719 kg)  BMI 34.31 kg/m2  SpO2 99%  LMP 04/09/2014   Physical Exam  Constitutional: She is oriented to person, place, and time. She appears well-developed and well-nourished. No distress.  HENT:  Head: Normocephalic and atraumatic.  Nasal congestion; Swollen tonsil bilaterally nearly kissing with exudate, no peritonsillar swelling.   Neck: Neck supple. No tracheal deviation present.  Cardiovascular: Normal rate.   Pulmonary/Chest: Effort normal and breath sounds normal. No respiratory distress. She has no rales.  No stridor.   Musculoskeletal: Normal range of motion.  Lymphadenopathy:    She has cervical adenopathy.  Neurological: She is alert and oriented to person, place, and time.  Skin: Skin is warm and dry.  Psychiatric: She has a normal mood and affect. Her behavior is normal.  Nursing note and vitals reviewed.   ED Course  Procedures (including critical care time) Labs Review Labs Reviewed -  No data to display  Imaging Review No results found.   EKG Interpretation None      MDM   Final diagnoses:  None    Patient with pharyngitis. Has exudate and nearly kissing tonsils. Will give antibiotics and steroids along with pain medicines. Will discharge home  I personally performed the services described in this documentation, which was scribed in my presence. The recorded information has been reviewed and is accurate.       Juliet RudeNathan R. Rubin PayorPickering, MD 04/20/14 [id number]

## 2014-04-20 NOTE — ED Notes (Signed)
Pt. reports sore throat with productive cough / nasal congestion onset last week . Denies fever or chills.

## 2014-05-02 ENCOUNTER — Emergency Department (HOSPITAL_COMMUNITY)
Admission: EM | Admit: 2014-05-02 | Discharge: 2014-05-02 | Disposition: A | Discharge status: Home / Self Care | Payer: BC Managed Care – PPO | Attending: Emergency Medicine | Admitting: Emergency Medicine

## 2014-05-02 ENCOUNTER — Encounter (HOSPITAL_COMMUNITY): Payer: Self-pay | Admitting: *Deleted

## 2014-05-02 ENCOUNTER — Emergency Department (HOSPITAL_COMMUNITY): Payer: BC Managed Care – PPO

## 2014-05-02 DIAGNOSIS — I10 Essential (primary) hypertension: Secondary | ICD-10-CM | POA: Insufficient documentation

## 2014-05-02 DIAGNOSIS — Z8619 Personal history of other infectious and parasitic diseases: Secondary | ICD-10-CM | POA: Insufficient documentation

## 2014-05-02 DIAGNOSIS — Z862 Personal history of diseases of the blood and blood-forming organs and certain disorders involving the immune mechanism: Secondary | ICD-10-CM | POA: Diagnosis not present

## 2014-05-02 DIAGNOSIS — R6883 Chills (without fever): Secondary | ICD-10-CM

## 2014-05-02 DIAGNOSIS — Z8659 Personal history of other mental and behavioral disorders: Secondary | ICD-10-CM | POA: Diagnosis not present

## 2014-05-02 DIAGNOSIS — Z3202 Encounter for pregnancy test, result negative: Secondary | ICD-10-CM | POA: Diagnosis not present

## 2014-05-02 DIAGNOSIS — E668 Other obesity: Secondary | ICD-10-CM | POA: Insufficient documentation

## 2014-05-02 DIAGNOSIS — Z87828 Personal history of other (healed) physical injury and trauma: Secondary | ICD-10-CM | POA: Insufficient documentation

## 2014-05-02 DIAGNOSIS — R110 Nausea: Secondary | ICD-10-CM | POA: Diagnosis not present

## 2014-05-02 DIAGNOSIS — R531 Weakness: Secondary | ICD-10-CM | POA: Diagnosis present

## 2014-05-02 DIAGNOSIS — R61 Generalized hyperhidrosis: Secondary | ICD-10-CM | POA: Diagnosis not present

## 2014-05-02 DIAGNOSIS — Z79899 Other long term (current) drug therapy: Secondary | ICD-10-CM | POA: Diagnosis not present

## 2014-05-02 LAB — URINALYSIS, ROUTINE W REFLEX MICROSCOPIC
BILIRUBIN URINE: NEGATIVE
Glucose, UA: NEGATIVE mg/dL
KETONES UR: NEGATIVE mg/dL
Leukocytes, UA: NEGATIVE
NITRITE: NEGATIVE
Protein, ur: NEGATIVE mg/dL
SPECIFIC GRAVITY, URINE: 1.016 (ref 1.005–1.030)
UROBILINOGEN UA: 0.2 mg/dL (ref 0.0–1.0)
pH: 6.5 (ref 5.0–8.0)

## 2014-05-02 LAB — CBC WITH DIFFERENTIAL/PLATELET
BASOS PCT: 1 % (ref 0–1)
Basophils Absolute: 0 10*3/uL (ref 0.0–0.1)
Eosinophils Absolute: 0.1 10*3/uL (ref 0.0–0.7)
Eosinophils Relative: 2 % (ref 0–5)
HEMATOCRIT: 35.2 % — AB (ref 36.0–46.0)
Hemoglobin: 11.7 g/dL — ABNORMAL LOW (ref 12.0–15.0)
Lymphocytes Relative: 31 % (ref 12–46)
Lymphs Abs: 1.5 10*3/uL (ref 0.7–4.0)
MCH: 27.1 pg (ref 26.0–34.0)
MCHC: 33.2 g/dL (ref 30.0–36.0)
MCV: 81.5 fL (ref 78.0–100.0)
MONO ABS: 0.3 10*3/uL (ref 0.1–1.0)
Monocytes Relative: 7 % (ref 3–12)
NEUTROS ABS: 2.8 10*3/uL (ref 1.7–7.7)
Neutrophils Relative %: 59 % (ref 43–77)
Platelets: 344 10*3/uL (ref 150–400)
RBC: 4.32 MIL/uL (ref 3.87–5.11)
RDW: 14.1 % (ref 11.5–15.5)
WBC: 4.7 10*3/uL (ref 4.0–10.5)

## 2014-05-02 LAB — URINE MICROSCOPIC-ADD ON

## 2014-05-02 LAB — PREGNANCY, URINE: Preg Test, Ur: NEGATIVE

## 2014-05-02 LAB — BASIC METABOLIC PANEL
ANION GAP: 11 (ref 5–15)
BUN: 13 mg/dL (ref 6–23)
CALCIUM: 9.1 mg/dL (ref 8.4–10.5)
CHLORIDE: 104 meq/L (ref 96–112)
CO2: 25 meq/L (ref 19–32)
Creatinine, Ser: 0.66 mg/dL (ref 0.50–1.10)
GFR calc Af Amer: >90 mL/min (ref >90)
GFR calc non Af Amer: >90 mL/min (ref >90)
Glucose, Bld: 100 mg/dL — ABNORMAL HIGH (ref 70–99)
Potassium: 3.6 mEq/L — ABNORMAL LOW (ref 3.7–5.3)
Sodium: 140 mEq/L (ref 137–147)

## 2014-05-02 LAB — CBG MONITORING, ED: GLUCOSE-CAPILLARY: 129 mg/dL — AB (ref 70–99)

## 2014-05-02 MED ORDER — ONDANSETRON HCL 4 MG/2ML IJ SOLN
4.0000 mg | Freq: Once | INTRAMUSCULAR | Status: AC
  Administered 2014-05-02: 4 mg via INTRAVENOUS
  Filled 2014-05-02: qty 2

## 2014-05-02 MED ORDER — SODIUM CHLORIDE 0.9 % IV BOLUS (SEPSIS)
1000.0000 mL | Freq: Once | INTRAVENOUS | Status: AC
  Administered 2014-05-02: 1000 mL via INTRAVENOUS

## 2014-05-02 MED ORDER — ONDANSETRON HCL 4 MG PO TABS: 4.0000 mg | ORAL_TABLET | Freq: Four times a day (QID) | ORAL | Status: DC

## 2014-05-02 MED ORDER — METOCLOPRAMIDE HCL 5 MG/ML IJ SOLN
10.0000 mg | Freq: Once | INTRAMUSCULAR | Status: AC
  Administered 2014-05-02: 10 mg via INTRAVENOUS
  Filled 2014-05-02: qty 2

## 2014-05-02 NOTE — Discharge Instructions (Signed)
Results for orders placed or performed during the hospital encounter of 05/02/14  Basic metabolic panel  Result Value Ref Range   Sodium 140 137 - 147 mEq/L   Potassium 3.6 (L) 3.7 - 5.3 mEq/L   Chloride 104 96 - 112 mEq/L   CO2 25 19 - 32 mEq/L   Glucose, Bld 100 (H) 70 - 99 mg/dL   BUN 13 6 - 23 mg/dL   Creatinine, Ser 1.610.66 0.50 - 1.10 mg/dL   Calcium 9.1 8.4 - 09.610.5 mg/dL   GFR calc non Af Amer >90 >90 mL/min   GFR calc Af Amer >90 >90 mL/min   Anion gap 11 5 - 15  CBC with Differential  Result Value Ref Range   WBC 4.7 4.0 - 10.5 K/uL   RBC 4.32 3.87 - 5.11 MIL/uL   Hemoglobin 11.7 (L) 12.0 - 15.0 g/dL   HCT 04.535.2 (L) 40.936.0 - 81.146.0 %   MCV 81.5 78.0 - 100.0 fL   MCH 27.1 26.0 - 34.0 pg   MCHC 33.2 30.0 - 36.0 g/dL   RDW 91.414.1 78.211.5 - 95.615.5 %   Platelets 344 150 - 400 K/uL   Neutrophils Relative % 59 43 - 77 %   Neutro Abs 2.8 1.7 - 7.7 K/uL   Lymphocytes Relative 31 12 - 46 %   Lymphs Abs 1.5 0.7 - 4.0 K/uL   Monocytes Relative 7 3 - 12 %   Monocytes Absolute 0.3 0.1 - 1.0 K/uL   Eosinophils Relative 2 0 - 5 %   Eosinophils Absolute 0.1 0.0 - 0.7 K/uL   Basophils Relative 1 0 - 1 %   Basophils Absolute 0.0 0.0 - 0.1 K/uL  Urinalysis, Routine w reflex microscopic  Result Value Ref Range   Color, Urine YELLOW YELLOW   APPearance CLEAR CLEAR   Specific Gravity, Urine 1.016 1.005 - 1.030   pH 6.5 5.0 - 8.0   Glucose, UA NEGATIVE NEGATIVE mg/dL   Hgb urine dipstick LARGE (A) NEGATIVE   Bilirubin Urine NEGATIVE NEGATIVE   Ketones, ur NEGATIVE NEGATIVE mg/dL   Protein, ur NEGATIVE NEGATIVE mg/dL   Urobilinogen, UA 0.2 0.0 - 1.0 mg/dL   Nitrite NEGATIVE NEGATIVE   Leukocytes, UA NEGATIVE NEGATIVE  Pregnancy, urine  Result Value Ref Range   Preg Test, Ur NEGATIVE NEGATIVE  Urine microscopic-add on  Result Value Ref Range   Squamous Epithelial / LPF RARE RARE   RBC / HPF 3-6 <3 RBC/hpf  POC CBG, ED  Result Value Ref Range   Glucose-Capillary 129 (H) 70 - 99 mg/dL   Dg  Chest 2 View  21/30/865712/14/2015   CLINICAL DATA:  Chest pain, dizziness  EXAM: CHEST  2 VIEW  COMPARISON:  01/26/2014  FINDINGS: Lungs are clear.  No pleural effusion or pneumothorax.  The heart is normal in size.  Visualized osseous structures are within normal limits.  IMPRESSION: Normal chest radiographs.   Electronically Signed   By: Charline BillsSriyesh  Krishnan M.D.   On: 05/02/2014 10:16     Nausea, Adult Nausea is the feeling that you have an upset stomach or have to vomit. Nausea by itself is not likely a serious concern, but it may be an early sign of more serious medical problems. As nausea gets worse, it can lead to vomiting. If vomiting develops, there is the risk of dehydration.  CAUSES   Viral infections.  Food poisoning.  Medicines.  Pregnancy.  Motion sickness.  Migraine headaches.  Emotional distress.  Severe pain from  any source.  Alcohol intoxication. HOME CARE INSTRUCTIONS  Get plenty of rest.  Ask your caregiver about specific rehydration instructions.  Eat small amounts of food and sip liquids more often.  Take all medicines as told by your caregiver. SEEK MEDICAL CARE IF:  You have not improved after 2 days, or you get worse.  You have a headache. SEEK IMMEDIATE MEDICAL CARE IF:   You have a fever.  You faint.  You keep vomiting or have blood in your vomit.  You are extremely weak or dehydrated.  You have dark or bloody stools.  You have severe chest or abdominal pain. MAKE SURE YOU:  Understand these instructions.  Will watch your condition.  Will get help right away if you are not doing well or get worse. Document Released: 06/13/2004 Document Revised: 01/29/2012 Document Reviewed: 01/16/2011 [facility] Patient Information 2015 [place]. This information is not intended to replace advice given to you by your health care provider. Make sure you discuss any questions you have with your health care provider.

## 2014-05-02 NOTE — ED Provider Notes (Signed)
CSN: [id number]     Arrival date & time 05/02/14  0533 History   First MD Initiated Contact with Patient 05/02/14 [phone]     Chief Complaint  Patient presents with  . Weakness     (Consider location/radiation/quality/duration/timing/severity/associated sxs/prior Treatment) HPI   Patient to the ER with multiple vague complaints. She woke up feeling "hot" this morning, otherwise feeling well. She started her menstrual cycle yesterday. She drove to work and while walking to her building developed nausea, diaphoresis, anxiety. Denies eating anything this morning. She denies having pain or weakness anywhere. She was seen on 04/20/14 for treatment of pharyngitis- reports finishing the medications and no longer having sore throat. Denies headache, CP, SOB, fatigue, fevers, hypertension, vaginal discharge, dysuria, abdominal pains, back pains.  Past Medical History  Diagnosis Date  . CHLAMYDIAL INFECTION 11/29/2009  . 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 DEPRESSION 10/20/2008  . HYPERTENSION 10/20/2008  . Polyuria 02/15/2010  . LUMBAR STRAIN, ACUTE 08/17/2009  . Sickle cell anemia     trait  . Herpes   . MWNUUVOZ(366.4Headache(784.0)    Past Surgical History  Procedure Laterality Date  . Cesarean section      X 3  . Tubal ligation     Family History  Problem Relation Age of Onset  . Arthritis Other   . Hypertension Other   . Depression Other    History  Substance Use Topics  . Smoking status: Never Smoker   . Smokeless tobacco: Never Used  . Alcohol Use: No   OB History    No data available     Review of Systems  10 Systems reviewed and are negative for acute change except as noted in the HPI.    Allergies  Review of patient's allergies indicates no known allergies.  Home Medications   Prior to Admission medications   Medication Sig Start Date End Date Taking? Authorizing Provider  amLODipine (NORVASC) 5 MG tablet Take 1 tablet (5 mg total) by mouth daily. 04/16/13  Yes Kristian CoveyBruce W Burchette, MD   oxyCODONE-acetaminophen (PERCOCET/ROXICET) 5-325 MG per tablet Take 1-2 tablets by mouth every 6 (six) hours as needed. Patient taking differently: Take 1-2 tablets by mouth every 6 (six) hours as needed for moderate pain.  04/20/14  Yes Juliet RudeNathan R. Pickering, MD  amoxicillin (AMOXIL) 500 MG capsule Take 1 capsule (500 mg total) by mouth 2 (two) times daily. Patient not taking: Reported on 05/02/2014 04/20/14   Juliet RudeNathan R. Pickering, MD  ondansetron (ZOFRAN) 4 MG tablet Take 1 tablet (4 mg total) by mouth every 6 (six) hours. 05/02/14    Irine SealG , PA-C  predniSONE (DELTASONE) 20 MG tablet Take 2 tablets (40 mg total) by mouth daily. Patient not taking: Reported on 05/02/2014 04/20/14   Juliet RudeNathan R. Pickering, MD   BP 133/81 mmHg  Pulse 61  Temp(Src) 97.8 F (36.6 C) (Oral)  Resp 14  SpO2 99%  LMP 04/09/2014 Physical Exam  Constitutional: She appears well-developed and well-nourished. She does not appear ill. No distress.  HENT:  Head: Normocephalic and atraumatic.  Right Ear: Tympanic membrane and ear canal normal.  Left Ear: Tympanic membrane and ear canal normal.  Nose: Nose normal.  Mouth/Throat: Uvula is midline, oropharynx is clear and moist and mucous membranes are normal.  Eyes: Conjunctivae and EOM are normal. Pupils are equal, round, and reactive to light.  Neck: Normal range of motion. Neck supple. No spinous process tenderness and no muscular tenderness present.  Cardiovascular: Normal rate and regular rhythm.  Pulmonary/Chest: Effort normal and breath sounds normal. She has no decreased breath sounds. She has no wheezes.  Abdominal: Soft. Bowel sounds are normal. There is no tenderness. There is no rigidity, no rebound and no guarding.  Neurological: She is alert.  Cranial nerves II-VIII and X-XII evaluated and show no deficits. Pt alert and oriented x 3 Upper and lower extremity strength is symmetrical and physiologic Normal muscular tone No facial droop Coordination  intact, no limb ataxia,   Skin: Skin is warm and dry.  Nursing note and vitals reviewed.   ED Course  Procedures (including critical care time) Labs Review Labs Reviewed  BASIC METABOLIC PANEL - Abnormal; Notable for the following:    Potassium 3.6 (*)    Glucose, Bld 100 (*)    All other components within normal limits  CBC WITH DIFFERENTIAL - Abnormal; Notable for the following:    Hemoglobin 11.7 (*)    HCT 35.2 (*)    All other components within normal limits  URINALYSIS, ROUTINE W REFLEX MICROSCOPIC - Abnormal; Notable for the following:    Hgb urine dipstick LARGE (*)    All other components within normal limits  CBG MONITORING, ED - Abnormal; Notable for the following:    Glucose-Capillary 129 (*)    All other components within normal limits  PREGNANCY, URINE  URINE MICROSCOPIC-ADD ON  POC URINE PREG, ED    Imaging Review Dg Chest 2 View  05/02/2014   CLINICAL DATA:  Chest pain, dizziness  EXAM: CHEST  2 VIEW  COMPARISON:  01/26/2014  FINDINGS: Lungs are clear.  No pleural effusion or pneumothorax.  The heart is normal in size.  Visualized osseous structures are within normal limits.  IMPRESSION: Normal chest radiographs.   Electronically Signed   By: Charline Bills M.D.   On: 05/02/2014 10:16     EKG Interpretation   Date/Time:  Monday May 02 2014 05:45:12 EST Ventricular Rate:  62 PR Interval:  190 QRS Duration: 102 QT Interval:  406 QTC Calculation: 412 R Axis:   54 Text Interpretation:  Normal sinus rhythm Non-specific intra-ventricular  conduction delay ECG OTHERWISE WITHIN NORMAL LIMITS since last tracing no  significant change Confirmed by MILLER  MD, BRIAN ([id number]) on 05/02/2014  7:52:39 AM      MDM   Final diagnoses:  Chills  Nausea    Medications  sodium chloride 0.9 % bolus 1,000 mL (0 mLs Intravenous Stopped 05/02/14 0857)  ondansetron (ZOFRAN) injection 4 mg (4 mg Intravenous Given 05/02/14 0652)  ondansetron (ZOFRAN) injection 4  mg (4 mg Intravenous Given 05/02/14 0855)  metoCLOPramide (REGLAN) injection 10 mg (10 mg Intravenous Given 05/02/14 0853)     Patient is currently menstruating, she has large hemoglobin in her urine but it is otherwise normal. Her H&H is not acute. Her BMP shows potassium of 3.6, otherwise normal. Normal Chest xray, normal CBC. She received fluids and Zofran, this has helped her nausea significantly but she still feels chills.  Her temp is 98.4, pulse is 61, resp 14, BP 133/81, and Sp02 is 99% on room air.  Patient has been made aware that if she is developing illness and it is very early,all her tests could be normal now but changed. Advised to return to the ED if she develops new symptoms or worsening symptoms.  38 y.o.Annamae L Bischof's evaluation in the Emergency Department is complete. It has been determined that no acute conditions requiring further emergency intervention are present at this time. The patient/guardian have  been advised of the diagnosis and plan. We have discussed signs and symptoms that warrant return to the ED, such as changes or worsening in symptoms.  Vital signs are stable at discharge. Filed Vitals:   05/02/14 0630  BP: 133/81  Pulse: 61  Temp:   Resp: 14    Patient/guardian has voiced understanding and agreed to follow-up with the PCP or specialist.     Dorthula Matasiffany G , PA-C 05/02/14 1109  Vida RollerBrian D Miller, MD 05/03/14 [phone]

## 2014-05-02 NOTE — ED Notes (Signed)
Ice chips given, pt. Tolerated without difficulty

## 2014-05-02 NOTE — ED Notes (Signed)
The pt woke up this am feeling jittery and nervous and sweating.  She went to work and was feeling weak and jittery.  C/o nausea.  nio pain anywhere.  No food this am  lmp  noine

## 2014-05-02 NOTE — ED Notes (Signed)
Patient denies pain and is resting comfortably.  

## 2014-05-11 ENCOUNTER — Encounter: Payer: Self-pay | Admitting: Family Medicine

## 2014-05-11 ENCOUNTER — Ambulatory Visit (INDEPENDENT_AMBULATORY_CARE_PROVIDER_SITE_OTHER): Payer: BC Managed Care – PPO

## 2014-05-11 ENCOUNTER — Other Ambulatory Visit (HOSPITAL_COMMUNITY)
Admission: RE | Admit: 2014-05-11 | Discharge: 2014-05-11 | Disposition: A | Discharge status: Home / Self Care | Payer: BC Managed Care – PPO | Source: Ambulatory Visit | Attending: Family Medicine | Admitting: Family Medicine

## 2014-05-11 ENCOUNTER — Ambulatory Visit (INDEPENDENT_AMBULATORY_CARE_PROVIDER_SITE_OTHER): Payer: BC Managed Care – PPO | Admitting: Family Medicine

## 2014-05-11 VITALS — BP 130/80 | HR 96 | Temp 97.7°F | Ht 64.0 in | Wt 200.0 lb

## 2014-05-11 DIAGNOSIS — Z01419 Encounter for gynecological examination (general) (routine) without abnormal findings: Secondary | ICD-10-CM | POA: Insufficient documentation

## 2014-05-11 DIAGNOSIS — R062 Wheezing: Secondary | ICD-10-CM

## 2014-05-11 DIAGNOSIS — Z0000 Encounter for general adult medical examination without abnormal findings: Secondary | ICD-10-CM

## 2014-05-11 DIAGNOSIS — R21 Rash and other nonspecific skin eruption: Secondary | ICD-10-CM

## 2014-05-11 DIAGNOSIS — Z23 Encounter for immunization: Secondary | ICD-10-CM

## 2014-05-11 LAB — LIPID PANEL
CHOLESTEROL: 154 mg/dL (ref 0–200)
HDL: 29.1 mg/dL — ABNORMAL LOW (ref >39.00)
LDL CALC: 111 mg/dL — AB (ref 0–99)
NonHDL: 124.9
Total CHOL/HDL Ratio: 5
Triglycerides: 71 mg/dL (ref 0.0–149.0)
VLDL: 14.2 mg/dL (ref 0.0–40.0)

## 2014-05-11 LAB — CBC WITH DIFFERENTIAL/PLATELET
Basophils Absolute: 0 10*3/uL (ref 0.0–0.1)
Basophils Relative: 0.6 % (ref 0.0–3.0)
Eosinophils Absolute: 0.1 10*3/uL (ref 0.0–0.7)
Eosinophils Relative: 2.4 % (ref 0.0–5.0)
HCT: 37.8 % (ref 36.0–46.0)
Hemoglobin: 12.3 g/dL (ref 12.0–15.0)
LYMPHS PCT: 41.2 % (ref 12.0–46.0)
Lymphs Abs: 2.3 10*3/uL (ref 0.7–4.0)
MCHC: 32.6 g/dL (ref 30.0–36.0)
MCV: 82.8 fl (ref 78.0–100.0)
MONO ABS: 0.4 10*3/uL (ref 0.1–1.0)
Monocytes Relative: 7.9 % (ref 3.0–12.0)
Neutro Abs: 2.7 10*3/uL (ref 1.4–7.7)
Neutrophils Relative %: 47.9 % (ref 43.0–77.0)
PLATELETS: 410 10*3/uL — AB (ref 150.0–400.0)
RBC: 4.57 Mil/uL (ref 3.87–5.11)
RDW: 14.6 % (ref 11.5–15.5)
WBC: 5.6 10*3/uL (ref 4.0–10.5)

## 2014-05-11 LAB — BASIC METABOLIC PANEL
BUN: 7 mg/dL (ref 6–23)
CHLORIDE: 103 meq/L (ref 96–112)
CO2: 26 mEq/L (ref 19–32)
CREATININE: 0.7 mg/dL (ref 0.4–1.2)
Calcium: 9.2 mg/dL (ref 8.4–10.5)
GFR: 117.18 mL/min (ref >60.00)
Glucose, Bld: 92 mg/dL (ref 70–99)
POTASSIUM: 3.2 meq/L — AB (ref 3.5–5.1)
Sodium: 137 mEq/L (ref 135–145)

## 2014-05-11 LAB — HEPATIC FUNCTION PANEL
ALT: 11 U/L (ref 0–35)
AST: 16 U/L (ref 0–37)
Albumin: 4.3 g/dL (ref 3.5–5.2)
Alkaline Phosphatase: 54 U/L (ref 39–117)
BILIRUBIN DIRECT: 0 mg/dL (ref 0.0–0.3)
Total Bilirubin: 0.3 mg/dL (ref 0.2–1.2)
Total Protein: 8.2 g/dL (ref 6.0–8.3)

## 2014-05-11 LAB — TSH: TSH: 0.95 u[IU]/mL (ref 0.35–4.50)

## 2014-05-11 MED ORDER — FLUCONAZOLE 100 MG PO TABS: 100.0000 mg | ORAL_TABLET | Freq: Every day | ORAL | Status: DC

## 2014-05-11 MED ORDER — ALBUTEROL SULFATE HFA 108 (90 BASE) MCG/ACT IN AERS: 2.0000 | INHALATION_SPRAY | Freq: Four times a day (QID) | RESPIRATORY_TRACT | Status: DC | PRN

## 2014-05-11 MED ORDER — PREDNISONE 10 MG PO TABS: ORAL_TABLET | ORAL | Status: DC

## 2014-05-11 MED ORDER — CLOTRIMAZOLE-BETAMETHASONE 1-0.05 % EX CREA: 1.0000 "application " | TOPICAL_CREAM | Freq: Two times a day (BID) | CUTANEOUS | Status: DC

## 2014-05-11 NOTE — Progress Notes (Signed)
Subjective:    Patient ID: Jamie Allison, female    DOB: 02/25/1976, 38 y.o.   MRN: [id number]  HPI  patient here for complete physical. She has history of obesity and hypertension. Tetanus shot is up-to-date. She's not had flu vaccine. Weight is been relatively stable. She has sickle cell trait and history of mild chronic normocytic anemia   She has a couple of acute problems as follows   cough for 3-4 days. No fevers or chills. Occasionally smokes. Nonproductive cough. Intermittent wheezing. No dyspnea at rest.   Pruritic rash involving her trunk and back region. Slightly scaly. Tried moisturizers without improvement. Previously had intertrigo rash which eventually cleared with oral antifungals. Requesting the same today.  Past Medical History  Diagnosis Date  . CHLAMYDIAL INFECTION 11/29/2009  . 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 DEPRESSION 10/20/2008  . HYPERTENSION 10/20/2008  . Polyuria 02/15/2010  . LUMBAR STRAIN, ACUTE 08/17/2009  . Sickle cell anemia     trait  . Herpes   . WUJWJXBJ(478.2Headache(784.0)    Past Surgical History  Procedure Laterality Date  . Cesarean section      X 3  . Tubal ligation      reports that she has never smoked. She has never used smokeless tobacco. She reports that she does not drink alcohol or use illicit drugs. family history includes Arthritis in her other; Depression in her other; Hypertension in her other. No Known Allergies    Review of Systems  Constitutional: Negative for fever, activity change, appetite change, fatigue and unexpected weight change.  HENT: Negative for ear pain, hearing loss, sore throat and trouble swallowing.   Eyes: Negative for visual disturbance.  Respiratory: Positive for cough and wheezing. Negative for shortness of breath.   Cardiovascular: Negative for chest pain and palpitations.  Gastrointestinal: Negative for abdominal pain, diarrhea, constipation and blood in stool.  Endocrine: Negative for polydipsia and polyuria.    Genitourinary: Negative for dysuria and hematuria.  Musculoskeletal: Negative for myalgias, back pain and arthralgias.  Skin: Positive for rash.  Neurological: Negative for dizziness, syncope and headaches.  Hematological: Negative for adenopathy.  Psychiatric/Behavioral: Negative for confusion and dysphoric mood.       Objective:   Physical Exam  Constitutional: She is oriented to person, place, and time. She appears well-developed and well-nourished.  HENT:  Head: Normocephalic and atraumatic.  Eyes: EOM are normal. Pupils are equal, round, and reactive to light.  Neck: Normal range of motion. Neck supple. No thyromegaly present.  Cardiovascular: Normal rate, regular rhythm and normal heart sounds.   No murmur heard. Pulmonary/Chest: No respiratory distress. She has wheezes. She has no rales.  Abdominal: Soft. Bowel sounds are normal. She exhibits no distension and no mass. There is no tenderness. There is no rebound and no guarding.  Genitourinary:  Breasts are symmetric with no mass. Normal external genitalia. She has some thin white mucus in the vaginal vault but cervix appears normal. Bimanual exam reveals no tenderness and no masses  Musculoskeletal: Normal range of motion. She exhibits no edema.  Lymphadenopathy:    She has no cervical adenopathy.  Neurological: She is alert and oriented to person, place, and time. She displays normal reflexes. No cranial nerve deficit.  Skin: Rash noted.  She has rash in her lower back region which has slightly elevated border and scaly center. Somewhat irregular contour. She has somewhat similar rash anterior trunk region near the waist.  Psychiatric: She has a normal mood and affect. Her behavior  is normal. Judgment and thought content normal.          Assessment & Plan:  #1 complete physical. Obtain screening lab work. Flu vaccine given. Work on losing some weight #2 wheezing associated cough. Prednisone taper. Pro-air inhaler for  as needed use. Suspect triggered by viral process. No respiratory distress and no history of asthma #3 skin rash. Area on back somewhat consistent with tenia corporis. Tried topical antifungal. Prescription for oral antifungal if this is not clearing with topical alone

## 2014-05-11 NOTE — Progress Notes (Signed)
Pre visit review using our clinic review tool, if applicable. No additional management support is needed unless otherwise documented below in the visit note. 

## 2014-05-12 ENCOUNTER — Other Ambulatory Visit: Payer: Self-pay | Admitting: *Deleted

## 2014-05-12 ENCOUNTER — Other Ambulatory Visit: Payer: Self-pay | Admitting: Family Medicine

## 2014-05-12 DIAGNOSIS — Z113 Encounter for screening for infections with a predominantly sexual mode of transmission: Secondary | ICD-10-CM

## 2014-05-12 LAB — CYTOLOGY - PAP

## 2014-05-12 MED ORDER — POTASSIUM CHLORIDE CRYS ER 20 MEQ PO TBCR: 20.0000 meq | EXTENDED_RELEASE_TABLET | Freq: Every day | ORAL | Status: DC

## 2014-05-16 ENCOUNTER — Telehealth: Payer: Self-pay | Admitting: Family Medicine

## 2014-05-16 NOTE — Telephone Encounter (Signed)
Patient would like to know her lab results from [facility] labs.

## 2014-05-16 NOTE — Telephone Encounter (Signed)
Pt informed

## 2014-06-01 ENCOUNTER — Ambulatory Visit (INDEPENDENT_AMBULATORY_CARE_PROVIDER_SITE_OTHER): Payer: BC Managed Care – PPO | Admitting: Family Medicine

## 2014-06-01 ENCOUNTER — Encounter (HOSPITAL_COMMUNITY): Payer: Self-pay

## 2014-06-01 ENCOUNTER — Emergency Department (HOSPITAL_COMMUNITY)
Admission: EM | Admit: 2014-06-01 | Discharge: 2014-06-01 | Disposition: A | Discharge status: Home / Self Care | Payer: BC Managed Care – PPO | Attending: Emergency Medicine | Admitting: Emergency Medicine

## 2014-06-01 ENCOUNTER — Encounter: Payer: Self-pay | Admitting: Family Medicine

## 2014-06-01 VITALS — BP 136/80 | HR 85 | Temp 98.2°F | Wt 200.0 lb

## 2014-06-01 DIAGNOSIS — J351 Hypertrophy of tonsils: Secondary | ICD-10-CM

## 2014-06-01 DIAGNOSIS — H538 Other visual disturbances: Secondary | ICD-10-CM

## 2014-06-01 DIAGNOSIS — Z8619 Personal history of other infectious and parasitic diseases: Secondary | ICD-10-CM | POA: Diagnosis not present

## 2014-06-01 DIAGNOSIS — E669 Obesity, unspecified: Secondary | ICD-10-CM | POA: Diagnosis not present

## 2014-06-01 DIAGNOSIS — Z79899 Other long term (current) drug therapy: Secondary | ICD-10-CM | POA: Diagnosis not present

## 2014-06-01 DIAGNOSIS — Z862 Personal history of diseases of the blood and blood-forming organs and certain disorders involving the immune mechanism: Secondary | ICD-10-CM | POA: Insufficient documentation

## 2014-06-01 DIAGNOSIS — I10 Essential (primary) hypertension: Secondary | ICD-10-CM | POA: Diagnosis not present

## 2014-06-01 DIAGNOSIS — Z7952 Long term (current) use of systemic steroids: Secondary | ICD-10-CM | POA: Diagnosis not present

## 2014-06-01 DIAGNOSIS — J029 Acute pharyngitis, unspecified: Secondary | ICD-10-CM | POA: Insufficient documentation

## 2014-06-01 DIAGNOSIS — Z8659 Personal history of other mental and behavioral disorders: Secondary | ICD-10-CM | POA: Diagnosis not present

## 2014-06-01 LAB — RAPID STREP SCREEN (MED CTR MEBANE ONLY): STREPTOCOCCUS, GROUP A SCREEN (DIRECT): NEGATIVE

## 2014-06-01 MED ORDER — DEXAMETHASONE 1 MG/ML PO CONC
10.0000 mg | Freq: Once | ORAL | Status: AC
  Administered 2014-06-01: 10 mg via ORAL
  Filled 2014-06-01 (×2): qty 10

## 2014-06-01 MED ORDER — IBUPROFEN 800 MG PO TABS
800.0000 mg | ORAL_TABLET | Freq: Once | ORAL | Status: AC
  Administered 2014-06-01: 800 mg via ORAL
  Filled 2014-06-01: qty 1

## 2014-06-01 MED ORDER — IBUPROFEN 800 MG PO TABS: 800.0000 mg | ORAL_TABLET | Freq: Three times a day (TID) | ORAL | Status: DC | PRN

## 2014-06-01 NOTE — ED Notes (Signed)
Pt A&OX4, ambulatory at d/c with steady gait, NAD 

## 2014-06-01 NOTE — ED Notes (Signed)
Dr. Walden at bedside 

## 2014-06-01 NOTE — Discharge Instructions (Signed)

## 2014-06-01 NOTE — ED Notes (Signed)
Pt reports her throat has been sore for about a week now, "I feel like my throat is closing." airway intact, pt speaking clear, complete sentences, O2-100% RA. Pt reports dry cough. She also says her vision has become more blurry over the last week and her right eye feels sore.

## 2014-06-01 NOTE — Patient Instructions (Signed)

## 2014-06-01 NOTE — ED Provider Notes (Signed)
CSN: [id number]     Arrival date & time 06/01/14  [id number] History   First MD Initiated Contact with Patient 06/01/14 [phone]     Chief Complaint  Patient presents with  . Sore Throat  . Blurred Vision     (Consider location/radiation/quality/duration/timing/severity/associated sxs/prior Treatment) Patient is a 39 y.o. female presenting with pharyngitis. The history is provided by the patient.  Sore Throat This is a new problem. The current episode started more than 2 days ago. The problem occurs constantly. The problem has been gradually worsening. Pertinent negatives include no chest pain, no abdominal pain and no shortness of breath. Nothing aggravates the symptoms. Nothing relieves the symptoms. She has tried nothing for the symptoms. The treatment provided no relief.    Past Medical History  Diagnosis Date  . CHLAMYDIAL INFECTION 11/29/2009  . 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 DEPRESSION 10/20/2008  . HYPERTENSION 10/20/2008  . Polyuria 02/15/2010  . LUMBAR STRAIN, ACUTE 08/17/2009  . Sickle cell anemia     trait  . Herpes   . VHQIONGE(952.8Headache(784.0)    Past Surgical History  Procedure Laterality Date  . Cesarean section      X 3  . Tubal ligation     Family History  Problem Relation Age of Onset  . Arthritis Other   . Hypertension Other   . Depression Other    History  Substance Use Topics  . Smoking status: Never Smoker   . Smokeless tobacco: Never Used  . Alcohol Use: No   OB History    No data available     Review of Systems  Constitutional: Negative for fever and chills.  Eyes: Positive for visual disturbance (mild occasional blurry vision, lasts a few seconds, happens a few times a day. No instigating, alleviating/exacerbating factors).  Respiratory: Positive for cough. Negative for shortness of breath.   Cardiovascular: Negative for chest pain.  Gastrointestinal: Negative for abdominal pain.  All other systems reviewed and are negative.     Allergies  Review of  patient's allergies indicates no known allergies.  Home Medications   Prior to Admission medications   Medication Sig Start Date End Date Taking? Authorizing Provider  albuterol (PROVENTIL HFA;VENTOLIN HFA) 108 (90 BASE) MCG/ACT inhaler Inhale 2 puffs into the lungs every 6 (six) hours as needed for wheezing or shortness of breath. 05/11/14   Kristian CoveyBruce W Burchette, MD  amLODipine (NORVASC) 5 MG tablet Take 1 tablet (5 mg total) by mouth daily. 04/16/13   Kristian CoveyBruce W Burchette, MD  clotrimazole-betamethasone (LOTRISONE) cream Apply 1 application topically 2 (two) times daily. 05/11/14   Kristian CoveyBruce W Burchette, MD  fluconazole (DIFLUCAN) 100 MG tablet Take 1 tablet (100 mg total) by mouth daily. 05/11/14   Kristian CoveyBruce W Burchette, MD  potassium chloride SA (K-DUR,KLOR-CON) 20 MEQ tablet Take 1 tablet (20 mEq total) by mouth daily. 05/12/14   Roderick PeeJeffrey A Todd, MD  predniSONE (DELTASONE) 10 MG tablet Taper as follows: 4-4-4-3-3-2-2 05/11/14   Kristian CoveyBruce W Burchette, MD   BP 179/86 mmHg  Pulse 99  Temp(Src) 98.5 F (36.9 C) (Oral)  Resp 17  Ht 5\' 4"  (1.626 m)  Wt 200 lb (90.719 kg)  BMI 34.31 kg/m2  SpO2 94%  LMP 06/01/2014 Physical Exam  Constitutional: She is oriented to person, place, and time. She appears well-developed and well-nourished. No distress.  HENT:  Head: Normocephalic and atraumatic.  Mouth/Throat: Oropharyngeal exudate (mild, white, bilateral), posterior oropharyngeal edema (bilateral tonsils) and posterior oropharyngeal erythema (posterior) present.  Eyes: EOM are normal.  Pupils are equal, round, and reactive to light.  Neck: Normal range of motion. Neck supple. No thyromegaly present.  Cardiovascular: Normal rate and regular rhythm.  Exam reveals no friction rub.   No murmur heard. Pulmonary/Chest: Effort normal and breath sounds normal. No respiratory distress. She has no wheezes. She has no rales.  Abdominal: Soft. She exhibits no distension. There is no tenderness. There is no rebound.   Musculoskeletal: Normal range of motion. She exhibits no edema.  Lymphadenopathy:    She has cervical adenopathy (bilateral).  Neurological: She is alert and oriented to person, place, and time.  Skin: She is not diaphoretic.  Nursing note and vitals reviewed.   ED Course  Procedures (including critical care time) Labs Review Labs Reviewed - No data to display  Imaging Review No results found.   EKG Interpretation None      MDM   Final diagnoses:  Pharyngitis    39 year old female here with sore throat. Sore throat for the past week, feels like her throat is closing up. Having some pain with swallowing. No URI symptoms, no congestion, rhinorrhea. Mild cough. No fevers. She's having some mild blurry vision, states that last a couple seconds a few times a day. No instigating factors. Goes away on its own. She's had some mild eye wateriness. Here vitals are stable. On exam she has markedly swollen tonsils with exudates with some posterior pharyngeal erythema. She has mild bilateral cervical lymphadenopathy that is tender. She has full range of motion of the neck without stiffness or rigidity. No need for CT. Exam consistent with bad pharyngitis, will swab for strep. Steroids given, Motrin given. Strep negative. Stable for discharge.   Elwin Mocha, MD 06/01/14 [id number]

## 2014-06-01 NOTE — Progress Notes (Signed)
Pre visit review using our clinic review tool, if applicable. No additional management support is needed unless otherwise documented below in the visit note. 

## 2014-06-01 NOTE — Progress Notes (Signed)
   Subjective:    Patient ID: Jamie Allison, female    DOB: 1975/08/16, 39 y.o.   MRN: [id number]  HPI Patient seen for ER follow-up. She presented early this morning with pharyngitis. Rapid strep negative. She's had some chronic issues with bilateral tonsillar enlargement. She was given Motrin and apparently corticosteroids and feels much better this time. No swallowing difficulties. She states has some chronic difficulties with difficulties breathing at night when she lays down. She has been observed to quit breathing at times. She feels obstructed in her posterior pharynx when she lays down at night frequently. She denies any recent fever.  She's had some very transient bilateral visual symptoms where she feels that there may be some mild blurring this only last a few seconds. No headaches. No dizziness. No focal weakness or numbness.  No speech changes. CT head 9/15 no acute findings.  Past Medical History  Diagnosis Date  . CHLAMYDIAL INFECTION 11/29/2009  . 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 DEPRESSION 10/20/2008  . HYPERTENSION 10/20/2008  . Polyuria 02/15/2010  . LUMBAR STRAIN, ACUTE 08/17/2009  . Sickle cell anemia     trait  . Herpes   . YQMVHQIO(962.9Headache(784.0)    Past Surgical History  Procedure Laterality Date  . Cesarean section      X 3  . Tubal ligation      reports that she has never smoked. She has never used smokeless tobacco. She reports that she does not drink alcohol or use illicit drugs. family history includes Arthritis in her other; Depression in her other; Hypertension in her other. No Known Allergies    Review of Systems  Constitutional: Negative for fever and chills.  HENT: Negative for trouble swallowing and voice change.   Eyes: Positive for visual disturbance. Negative for photophobia, pain, discharge, redness and itching.  Respiratory: Negative for cough.   Cardiovascular: Negative for chest pain.  Endocrine: Negative for polydipsia and polyuria.    Neurological: Negative for dizziness, seizures, syncope, weakness and headaches.  Psychiatric/Behavioral: Negative for confusion.       Objective:   Physical Exam  Constitutional: She is oriented to person, place, and time. She appears well-developed and well-nourished.  HENT:  Oropharynx reveals 2+ symmetrically enlarged tonsils without exudate. Minimal erythema  Eyes: Pupils are equal, round, and reactive to light.  No papilledema.  Neck: Neck supple.  Cardiovascular: Normal rate and regular rhythm.   Pulmonary/Chest: Effort normal and breath sounds normal. No respiratory distress. She has no wheezes. She has no rales.  Lymphadenopathy:    She has no cervical adenopathy.  Neurological: She is alert and oriented to person, place, and time. No cranial nerve deficit.          Assessment & Plan:  #1 pharyngitis. Rapid strep negative in  ED early this AM. Throat culture pending. Patient symptomatically improved following Motrin and steroids. Continue observation. She is requesting ENT referral because of chronically enlarged tonsils #2 transient blurred vision. Symptoms were very transient lasting a few seconds and bilateral. No diplopia.  Visual acuity normal. No evidence for papilledema.

## 2014-06-02 LAB — CULTURE, GROUP A STREP

## 2014-06-07 ENCOUNTER — Emergency Department (HOSPITAL_COMMUNITY)
Admission: EM | Admit: 2014-06-07 | Discharge: 2014-06-07 | Disposition: A | Discharge status: Home / Self Care | Payer: BC Managed Care – PPO | Attending: Family Medicine | Admitting: Family Medicine

## 2014-06-07 ENCOUNTER — Encounter (HOSPITAL_COMMUNITY): Payer: Self-pay | Admitting: Physical Medicine and Rehabilitation

## 2014-06-07 DIAGNOSIS — H53149 Visual discomfort, unspecified: Secondary | ICD-10-CM | POA: Insufficient documentation

## 2014-06-07 DIAGNOSIS — Z862 Personal history of diseases of the blood and blood-forming organs and certain disorders involving the immune mechanism: Secondary | ICD-10-CM | POA: Diagnosis not present

## 2014-06-07 DIAGNOSIS — Z87828 Personal history of other (healed) physical injury and trauma: Secondary | ICD-10-CM | POA: Insufficient documentation

## 2014-06-07 DIAGNOSIS — H109 Unspecified conjunctivitis: Secondary | ICD-10-CM

## 2014-06-07 DIAGNOSIS — I10 Essential (primary) hypertension: Secondary | ICD-10-CM | POA: Diagnosis not present

## 2014-06-07 DIAGNOSIS — Z8619 Personal history of other infectious and parasitic diseases: Secondary | ICD-10-CM | POA: Diagnosis not present

## 2014-06-07 DIAGNOSIS — H5710 Ocular pain, unspecified eye: Secondary | ICD-10-CM | POA: Diagnosis present

## 2014-06-07 DIAGNOSIS — Z7952 Long term (current) use of systemic steroids: Secondary | ICD-10-CM | POA: Diagnosis not present

## 2014-06-07 DIAGNOSIS — E669 Obesity, unspecified: Secondary | ICD-10-CM | POA: Diagnosis not present

## 2014-06-07 DIAGNOSIS — Z79899 Other long term (current) drug therapy: Secondary | ICD-10-CM | POA: Diagnosis not present

## 2014-06-07 DIAGNOSIS — Z8659 Personal history of other mental and behavioral disorders: Secondary | ICD-10-CM | POA: Diagnosis not present

## 2014-06-07 MED ORDER — TETRACAINE HCL 0.5 % OP SOLN
1.0000 [drp] | Freq: Once | OPHTHALMIC | Status: AC
  Administered 2014-06-07: 1 [drp] via OPHTHALMIC
  Filled 2014-06-07: qty 2

## 2014-06-07 MED ORDER — ACETAMINOPHEN 325 MG PO TABS
650.0000 mg | ORAL_TABLET | Freq: Once | ORAL | Status: AC
  Administered 2014-06-07: 650 mg via ORAL
  Filled 2014-06-07: qty 2

## 2014-06-07 MED ORDER — DICLOFENAC SODIUM 0.1 % OP SOLN: 1.0000 [drp] | Freq: Four times a day (QID) | OPHTHALMIC | Status: DC

## 2014-06-07 MED ORDER — TOBRAMYCIN 0.3 % OP SOLN: 2.0000 [drp] | OPHTHALMIC | Status: DC

## 2014-06-07 MED ORDER — FLUORESCEIN SODIUM 1 MG OP STRP
1.0000 | ORAL_STRIP | Freq: Once | OPHTHALMIC | Status: AC
  Administered 2014-06-07: 1 via OPHTHALMIC
  Filled 2014-06-07: qty 1

## 2014-06-07 NOTE — ED Provider Notes (Signed)
CSN: [id number]     Arrival date & time 06/07/14  1614 History   First MD Initiated Contact with Patient 06/07/14 1746     Chief Complaint  Patient presents with  . Eye Pain     (Consider location/radiation/quality/duration/timing/severity/associated sxs/prior Treatment) HPI Comments: No reported injury. No contact lens use. No previous episodes. Recently treated for pharyngitis. No fever. Mild amount of crusted material in corners of eyes when she wakes in the mornings. Describes eye as "watering, throbbing and red." Also reports moderate left eye photophobia over past 2-3 days. Denies changes in vision, states only difficulty is that it hurts to have eye open in bright light.  PCP: Burchette    Patient is a 39 y.o. female presenting with eye pain. The history is provided by the patient.  Eye Pain This is a new problem. Episode onset: sx began 6 days ago  The problem occurs constantly. The problem has been gradually worsening. Associated symptoms include a sore throat and a visual change. Pertinent negatives include no abdominal pain, arthralgias, chills, congestion, coughing, diaphoresis, fatigue, fever, headaches, joint swelling, myalgias, nausea, neck pain, numbness, rash, swollen glands, urinary symptoms, vertigo, vomiting or weakness.    Past Medical History  Diagnosis Date  . CHLAMYDIAL INFECTION 11/29/2009  . 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 DEPRESSION 10/20/2008  . HYPERTENSION 10/20/2008  . Polyuria 02/15/2010  . LUMBAR STRAIN, ACUTE 08/17/2009  . Sickle cell anemia     trait  . Herpes   . WUJWJXBJ(478.2Headache(784.0)    Past Surgical History  Procedure Laterality Date  . Cesarean section      X 3  . Tubal ligation     Family History  Problem Relation Age of Onset  . Arthritis Other   . Hypertension Other   . Depression Other    History  Substance Use Topics  . Smoking status: Never Smoker   . Smokeless tobacco: Never Used  . Alcohol Use: No   OB History    No data available      Review of Systems  Constitutional: Negative for fever, chills, diaphoresis and fatigue.  HENT: Positive for sore throat. Negative for congestion.   Eyes: Positive for photophobia, pain, discharge, redness and itching.  Respiratory: Negative for cough.   Gastrointestinal: Negative for nausea, vomiting and abdominal pain.  Musculoskeletal: Negative for myalgias, joint swelling, arthralgias and neck pain.  Skin: Negative for rash.  Neurological: Negative for vertigo, weakness, numbness and headaches.      Allergies  Review of patient's allergies indicates no known allergies.  Home Medications   Prior to Admission medications   Medication Sig Start Date End Date Taking? Authorizing Provider  albuterol (PROVENTIL HFA;VENTOLIN HFA) 108 (90 BASE) MCG/ACT inhaler Inhale 2 puffs into the lungs every 6 (six) hours as needed for wheezing or shortness of breath. 05/11/14   Kristian CoveyBruce W Burchette, MD  amLODipine (NORVASC) 5 MG tablet Take 1 tablet (5 mg total) by mouth daily. 04/16/13   Kristian CoveyBruce W Burchette, MD  clotrimazole-betamethasone (LOTRISONE) cream Apply 1 application topically 2 (two) times daily. 05/11/14   Kristian CoveyBruce W Burchette, MD  diclofenac (VOLTAREN) 0.1 % ophthalmic solution Place 1 drop into the right eye 4 (four) times daily. X 7 days 06/07/14   Mathis FareJennifer Lee H , PA  fluconazole (DIFLUCAN) 100 MG tablet Take 1 tablet (100 mg total) by mouth daily. 05/11/14   Kristian CoveyBruce W Burchette, MD  ibuprofen (ADVIL,MOTRIN) 800 MG tablet Take 1 tablet (800 mg total) by mouth every 8 (eight)  hours as needed. 06/01/14   Elwin Mocha, MD  potassium chloride SA (K-DUR,KLOR-CON) 20 MEQ tablet Take 1 tablet (20 mEq total) by mouth daily. 05/12/14   Roderick Pee, MD  predniSONE (DELTASONE) 10 MG tablet Taper as follows: 4-4-4-3-3-2-2 05/11/14   Kristian Covey, MD  tobramycin (TOBREX) 0.3 % ophthalmic solution Place 2 drops into the right eye every 4 (four) hours. X 7 days 06/07/14   Jess Barters H ,  PA   BP 159/87 mmHg  Pulse 66  Temp(Src) 98.9 F (37.2 C) (Oral)  Resp 18  SpO2 100%  LMP 06/01/2014 Physical Exam  Constitutional: She is oriented to person, place, and time. She appears well-developed and well-nourished. No distress.  HENT:  Head: Normocephalic and atraumatic.  Right Ear: External ear normal.  Left Ear: External ear normal.  Nose: Nose normal.  Mouth/Throat: Oropharynx is clear and moist.  Eyes: EOM and lids are normal. Pupils are equal, round, and reactive to light. Right eye exhibits no chemosis, no discharge, no exudate and no hordeolum. No foreign body present in the right eye. Left eye exhibits no discharge. Right conjunctiva is injected. Right conjunctiva has no hemorrhage. Left conjunctiva is not injected. Left conjunctiva has no hemorrhage. No scleral icterus.  Slit lamp exam:      The right eye shows no corneal abrasion, no corneal ulcer, no foreign body, no hyphema and no fluorescein uptake.  +direct photophobia but no consensual photophobia  Neck: Normal range of motion. Neck supple.  Cardiovascular: Normal rate.   Pulmonary/Chest: Effort normal.  Neurological: She is alert and oriented to person, place, and time.  Skin: Skin is warm and dry. No rash noted. No erythema.  Psychiatric: She has a normal mood and affect. Her behavior is normal.  Nursing note and vitals reviewed.   ED Course  Procedures (including critical care time) Labs Review Labs Reviewed - No data to display  Imaging Review No results found.   EKG Interpretation None      MDM   Final diagnoses:  Conjunctivitis of right eye  Suspect conjunctivitis with associated photophobia. Symptoms have been present for 6 days. Patient is without foreign body sensation, without fluorescein uptake, without consensual photophobia, without nausea and vomiting, without corneal ulcer or dendritic lesions, and without changes in vision. She still needs opthalmology follow up but this does not  appear to need to happen on urgent basis tonight. Will treat with Tobrex opth drops and Diclofenac opth drops and advise her to contact either Dr. Laruth Bouchard office or Dr. Anne Hahn office tomorrow to arrange follow up evaluation.     Ria Clock, [place] 06/07/14 [phone]

## 2014-06-07 NOTE — ED Notes (Signed)
Pt states R eye pain, ongoing x2 days, reports redness and pain. No injuries noted. Denies visual issues.

## 2014-06-07 NOTE — ED Notes (Signed)
Pt made aware to return if symptoms worsen or if any life threatening symptoms occur.   

## 2014-06-07 NOTE — ED Notes (Signed)
Pt reports right eye pain x2 days of unknown origin.  Pt reports redness and pain in and around the eye.  Pt also reports blurred vision and light sensitivity.  Pt states "it feels like there is a film on the surface of my eye."

## 2014-06-07 NOTE — Discharge Instructions (Signed)
I would strongly recommend that you contact the ophthalmologist listed on your discharge paperwork tomorrow to arrange a follow up appointment. Please begin using medications as prescribed as soon as possible.  If symptoms become suddenly worse or severe, return to the ER for re-evaluation.   Conjunctivitis Conjunctivitis is commonly called "pink eye." Conjunctivitis can be caused by bacterial or viral infection, allergies, or injuries. There is usually redness of the lining of the eye, itching, discomfort, and sometimes discharge. There may be deposits of matter along the eyelids. A viral infection usually causes a watery discharge, while a bacterial infection causes a yellowish, thick discharge. Pink eye is very contagious and spreads by direct contact. You may be given antibiotic eyedrops as part of your treatment. Before using your eye medicine, remove all drainage from the eye by washing gently with warm water and cotton balls. Continue to use the medication until you have awakened 2 mornings in a row without discharge from the eye. Do not rub your eye. This increases the irritation and helps spread infection. Use separate towels from other household members. Wash your hands with soap and water before and after touching your eyes. Use cold compresses to reduce pain and sunglasses to relieve irritation from light. Do not wear contact lenses or wear eye makeup until the infection is gone. SEEK MEDICAL CARE IF:   Your symptoms are not better after 3 days of treatment.  You have increased pain or trouble seeing.  The outer eyelids become very red or swollen. Document Released: 06/13/2004 Document Revised: 07/29/2011 Document Reviewed: 05/06/2005 [facility] Patient Information 2015 [place], [place]. This information is not intended to replace advice given to you by your health care provider. Make sure you discuss any questions you have with your health care provider.

## 2014-06-16 ENCOUNTER — Other Ambulatory Visit: Payer: Self-pay | Admitting: Otolaryngology

## 2014-06-25 ENCOUNTER — Emergency Department (HOSPITAL_COMMUNITY)
Admission: EM | Admit: 2014-06-25 | Discharge: 2014-06-25 | Disposition: A | Discharge status: Home / Self Care | Payer: BC Managed Care – PPO | Attending: Emergency Medicine | Admitting: Emergency Medicine

## 2014-06-25 ENCOUNTER — Encounter (HOSPITAL_COMMUNITY): Payer: Self-pay | Admitting: Emergency Medicine

## 2014-06-25 DIAGNOSIS — Z79899 Other long term (current) drug therapy: Secondary | ICD-10-CM | POA: Diagnosis not present

## 2014-06-25 DIAGNOSIS — Z8619 Personal history of other infectious and parasitic diseases: Secondary | ICD-10-CM | POA: Diagnosis not present

## 2014-06-25 DIAGNOSIS — Z862 Personal history of diseases of the blood and blood-forming organs and certain disorders involving the immune mechanism: Secondary | ICD-10-CM | POA: Diagnosis not present

## 2014-06-25 DIAGNOSIS — Z8659 Personal history of other mental and behavioral disorders: Secondary | ICD-10-CM | POA: Insufficient documentation

## 2014-06-25 DIAGNOSIS — Z87828 Personal history of other (healed) physical injury and trauma: Secondary | ICD-10-CM | POA: Insufficient documentation

## 2014-06-25 DIAGNOSIS — E669 Obesity, unspecified: Secondary | ICD-10-CM | POA: Diagnosis not present

## 2014-06-25 DIAGNOSIS — R112 Nausea with vomiting, unspecified: Secondary | ICD-10-CM | POA: Insufficient documentation

## 2014-06-25 DIAGNOSIS — K920 Hematemesis: Secondary | ICD-10-CM | POA: Diagnosis present

## 2014-06-25 DIAGNOSIS — I10 Essential (primary) hypertension: Secondary | ICD-10-CM | POA: Diagnosis not present

## 2014-06-25 LAB — I-STAT CHEM 8, ED
BUN: 25 mg/dL — ABNORMAL HIGH (ref 6–23)
Calcium, Ion: 1.17 mmol/L (ref 1.12–1.23)
Chloride: 103 mmol/L (ref 96–112)
Creatinine, Ser: 1.5 mg/dL — ABNORMAL HIGH (ref 0.50–1.10)
GLUCOSE: 125 mg/dL — AB (ref 70–99)
HEMATOCRIT: 42 % (ref 36.0–46.0)
Hemoglobin: 14.3 g/dL (ref 12.0–15.0)
POTASSIUM: 3.7 mmol/L (ref 3.5–5.1)
Sodium: 140 mmol/L (ref 135–145)
TCO2: 24 mmol/L (ref 0–100)

## 2014-06-25 LAB — CBC WITH DIFFERENTIAL/PLATELET
BASOS PCT: 0 % (ref 0–1)
Basophils Absolute: 0 10*3/uL (ref 0.0–0.1)
EOS PCT: 1 % (ref 0–5)
Eosinophils Absolute: 0.1 10*3/uL (ref 0.0–0.7)
HEMATOCRIT: 38.6 % (ref 36.0–46.0)
HEMOGLOBIN: 13.2 g/dL (ref 12.0–15.0)
LYMPHS PCT: 34 % (ref 12–46)
Lymphs Abs: 2.6 10*3/uL (ref 0.7–4.0)
MCH: 27.4 pg (ref 26.0–34.0)
MCHC: 34.2 g/dL (ref 30.0–36.0)
MCV: 80.1 fL (ref 78.0–100.0)
Monocytes Absolute: 1 10*3/uL (ref 0.1–1.0)
Monocytes Relative: 12 % (ref 3–12)
NEUTROS PCT: 53 % (ref 43–77)
Neutro Abs: 4.1 10*3/uL (ref 1.7–7.7)
Platelets: 476 10*3/uL — ABNORMAL HIGH (ref 150–400)
RBC: 4.82 MIL/uL (ref 3.87–5.11)
RDW: 14.3 % (ref 11.5–15.5)
WBC: 7.7 10*3/uL (ref 4.0–10.5)

## 2014-06-25 MED ORDER — SODIUM CHLORIDE 0.9 % IV BOLUS (SEPSIS)
500.0000 mL | Freq: Once | INTRAVENOUS | Status: AC
  Administered 2014-06-25: 500 mL via INTRAVENOUS

## 2014-06-25 MED ORDER — ONDANSETRON 4 MG PO TBDP: 4.0000 mg | ORAL_TABLET | Freq: Three times a day (TID) | ORAL | Status: DC | PRN

## 2014-06-25 MED ORDER — ONDANSETRON HCL 4 MG/2ML IJ SOLN
4.0000 mg | Freq: Once | INTRAMUSCULAR | Status: AC
  Administered 2014-06-25: 4 mg via INTRAVENOUS
  Filled 2014-06-25: qty 2

## 2014-06-25 MED ORDER — HYDROCODONE-ACETAMINOPHEN 7.5-325 MG/15ML PO SOLN: 10.0000 mL | Freq: Four times a day (QID) | ORAL | Status: DC | PRN

## 2014-06-25 NOTE — Discharge Instructions (Signed)
Nausea and Vomiting Nausea means you feel sick to your stomach. Throwing up (vomiting) is a reflex where stomach contents come out of your mouth. HOME CARE   Take medicine as told by your doctor.  Do not force yourself to eat. However, you do need to drink fluids.  If you feel like eating, eat a normal diet as told by your doctor.  Eat rice, wheat, potatoes, bread, lean meats, yogurt, fruits, and vegetables.  Avoid high-fat foods.  Drink enough fluids to keep your pee (urine) clear or pale yellow.  Ask your doctor how to replace body fluid losses (rehydrate). Signs of body fluid loss (dehydration) include:  Feeling very thirsty.  Dry lips and mouth.  Feeling dizzy.  Dark pee.  Peeing less than normal.  Feeling confused.  Fast breathing or heart rate. GET HELP RIGHT AWAY IF:   You have blood in your throw up.  You have black or bloody poop (stool).  You have a bad headache or stiff neck.  You feel confused.  You have bad belly (abdominal) pain.  You have chest pain or trouble breathing.  You do not pee at least once every 8 hours.  You have cold, clammy skin.  You keep throwing up after 24 to 48 hours.  You have a fever. MAKE SURE YOU:   Understand these instructions.  Will watch your condition.  Will get help right away if you are not doing well or get worse. Document Released: 10/23/2007 Document Revised: 07/29/2011 Document Reviewed: 10/05/2010 [facility] Patient Information 2015 [place], [place]. This information is not intended to replace advice given to you by your health care provider. Make sure you discuss any questions you have with your health care provider. Use the Zofran as needed for nausea.  Please take this approximately 20 minutes before you attempt to eat or drink fluids or food.  When you do start eating and drinking, eat small amounts frequently drink small amounts frequently.  Follow-up with your surgeon or PCP as needed

## 2014-06-25 NOTE — ED Provider Notes (Signed)
CSN: [id number]     Arrival date & time 06/25/14  0300 History   First MD Initiated Contact with Patient 06/25/14 [phone]     Chief Complaint  Patient presents with  . Hematemesis  . Post-op Problem     (Consider location/radiation/quality/duration/timing/severity/associated sxs/prior Treatment) HPI Comments: Pateint started vomiting about 3 PM yesterday, states she has taken a liquid antiemetic without relief. Denies fever, SOB, diarrhea   States has been able to drink small amounts frequently until yesterday appetite has been poor but is able to eat soft foods   The history is provided by the patient.    Past Medical History  Diagnosis Date  . CHLAMYDIAL INFECTION 11/29/2009  . 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 DEPRESSION 10/20/2008  . HYPERTENSION 10/20/2008  . Polyuria 02/15/2010  . LUMBAR STRAIN, ACUTE 08/17/2009  . Sickle cell anemia     trait  . Herpes   . JXBJYNWG(956.2)    Past Surgical History  Procedure Laterality Date  . Cesarean section      X 3  . Tubal ligation    . Tonsillectomy and adenoidectomy     Family History  Problem Relation Age of Onset  . Arthritis Other   . Hypertension Other   . Depression Other    History  Substance Use Topics  . Smoking status: Never Smoker   . Smokeless tobacco: Never Used  . Alcohol Use: No   OB History    No data available     Review of Systems  Constitutional: Negative for fever and chills.  HENT: Negative for drooling.   Respiratory: Negative for shortness of breath.   Cardiovascular: Negative for chest pain.  Gastrointestinal: Positive for nausea and vomiting. Negative for abdominal pain, diarrhea and constipation.  Neurological: Negative for headaches.  All other systems reviewed and are negative.     Allergies  Review of patient's allergies indicates no known allergies.  Home Medications   Prior to Admission medications   Medication Sig Start Date End Date Taking? Authorizing Provider  albuterol  (PROVENTIL HFA;VENTOLIN HFA) 108 (90 BASE) MCG/ACT inhaler Inhale 2 puffs into the lungs every 6 (six) hours as needed for wheezing or shortness of breath. 05/11/14  Yes Kristian Covey, MD  amLODipine (NORVASC) 5 MG tablet Take 1 tablet (5 mg total) by mouth daily. 04/16/13  Yes Kristian Covey, MD  clotrimazole-betamethasone (LOTRISONE) cream Apply 1 application topically 2 (two) times daily. Patient taking differently: Apply 1 application topically 2 (two) times daily as needed (rash).  05/11/14  Yes Kristian Covey, MD  diclofenac (VOLTAREN) 0.1 % ophthalmic solution Place 1 drop into the right eye 4 (four) times daily. X 7 days Patient taking differently: Place 1 drop into the right eye 4 (four) times daily.  06/07/14  Yes Mathis Fare Presson, PA  ibuprofen (ADVIL,MOTRIN) 800 MG tablet Take 1 tablet (800 mg total) by mouth every 8 (eight) hours as needed. Patient taking differently: Take 800 mg by mouth every 8 (eight) hours as needed for moderate pain.  06/01/14  Yes Elwin Mocha, MD  potassium chloride SA (K-DUR,KLOR-CON) 20 MEQ tablet Take 1 tablet (20 mEq total) by mouth daily. 05/12/14  Yes Roderick Pee, MD  prednisoLONE acetate (PRED FORTE) 1 % ophthalmic suspension Place 1 drop into the right eye 4 (four) times daily.   Yes Historical Provider, MD  tobramycin (TOBREX) 0.3 % ophthalmic solution Place 2 drops into the right eye every 4 (four) hours. X 7 days Patient taking  differently: Place 2 drops into the right eye every 4 (four) hours.  06/07/14  Yes Mathis FareJennifer Lee H Presson, PA  fluconazole (DIFLUCAN) 100 MG tablet Take 1 tablet (100 mg total) by mouth daily. Patient not taking: Reported on 06/25/2014 05/11/14   Kristian CoveyBruce W Burchette, MD  HYDROcodone-acetaminophen (HYCET) 7.5-325 mg/15 ml solution Take 10 mLs by mouth 4 (four) times daily as needed for moderate pain. 06/25/14   Arman FilterGail K , NP  ondansetron (ZOFRAN ODT) 4 MG disintegrating tablet Take 1 tablet (4 mg total) by mouth every  8 (eight) hours as needed for nausea or vomiting. 06/25/14   Arman FilterGail K , NP  predniSONE (DELTASONE) 10 MG tablet Taper as follows: 4-4-4-3-3-2-2 Patient not taking: Reported on 06/25/2014 05/11/14   Kristian CoveyBruce W Burchette, MD   BP 143/80 mmHg  Pulse 63  Temp(Src) 98.9 F (37.2 C)  Resp 16  Ht 5\' 4"  (1.626 m)  Wt 187 lb (84.823 kg)  BMI 32.08 kg/m2  SpO2 100%  LMP 06/01/2014 Physical Exam  Constitutional: She is oriented to person, place, and time. She appears well-developed and well-nourished.  HENT:  Head: Normocephalic.  Mouth/Throat: Oropharynx is clear and moist.  Eyes: Pupils are equal, round, and reactive to light.  Neck: Normal range of motion.  Cardiovascular: Normal rate and regular rhythm.   Pulmonary/Chest: Effort normal and breath sounds normal.  Abdominal: Soft. She exhibits no distension. There is no tenderness.  Musculoskeletal: Normal range of motion.  Lymphadenopathy:    She has cervical adenopathy.  Neurological: She is alert and oriented to person, place, and time.  Skin: Skin is warm.  Nursing note and vitals reviewed.   ED Course  Procedures (including critical care time) Labs Review Labs Reviewed  CBC WITH DIFFERENTIAL/PLATELET - Abnormal; Notable for the following:    Platelets 476 (*)    All other components within normal limits  I-STAT CHEM 8, ED - Abnormal; Notable for the following:    BUN 25 (*)    Creatinine, Ser 1.50 (*)    Glucose, Bld 125 (*)    All other components within normal limits    Imaging Review No results found.   EKG Interpretation None     Able to tolerate PO also will need additional pain control at home  MDM   Final diagnoses:  Nausea and vomiting in adult patient        Arman FilterGail K , NP 06/25/14 [id number]  Olivia Mackielga M Otter, MD 06/25/14 [phone]

## 2014-06-25 NOTE — ED Notes (Signed)
Pt arrives from home with c/o of hemtaemesis since this morning, tonsils and adenoids removed last Thursday. Bright red blood. Pain in throat and abdomen

## 2014-06-27 ENCOUNTER — Other Ambulatory Visit: Payer: Self-pay | Admitting: Family Medicine

## 2014-09-28 ENCOUNTER — Ambulatory Visit (INDEPENDENT_AMBULATORY_CARE_PROVIDER_SITE_OTHER): Payer: BC Managed Care – PPO | Admitting: Family Medicine

## 2014-09-28 ENCOUNTER — Encounter: Payer: Self-pay | Admitting: Family Medicine

## 2014-09-28 VITALS — BP 140/80 | HR 66 | Temp 98.2°F | Wt 210.0 lb

## 2014-09-28 DIAGNOSIS — G44219 Episodic tension-type headache, not intractable: Secondary | ICD-10-CM | POA: Diagnosis not present

## 2014-09-28 DIAGNOSIS — B356 Tinea cruris: Secondary | ICD-10-CM | POA: Diagnosis not present

## 2014-09-28 DIAGNOSIS — Z113 Encounter for screening for infections with a predominantly sexual mode of transmission: Secondary | ICD-10-CM

## 2014-09-28 DIAGNOSIS — R8290 Unspecified abnormal findings in urine: Secondary | ICD-10-CM | POA: Diagnosis not present

## 2014-09-28 LAB — POCT URINALYSIS DIPSTICK
Bilirubin, UA: NEGATIVE
Glucose, UA: NEGATIVE
KETONES UA: NEGATIVE
Leukocytes, UA: NEGATIVE
Nitrite, UA: NEGATIVE
PROTEIN UA: NEGATIVE
Spec Grav, UA: 1.01
Urobilinogen, UA: 0.2
pH, UA: 6

## 2014-09-28 MED ORDER — FLUCONAZOLE 100 MG PO TABS: 100.0000 mg | ORAL_TABLET | Freq: Every day | ORAL | Status: DC

## 2014-09-28 MED ORDER — CYCLOBENZAPRINE HCL 10 MG PO TABS: 10.0000 mg | ORAL_TABLET | Freq: Three times a day (TID) | ORAL | Status: DC | PRN

## 2014-09-28 NOTE — Progress Notes (Signed)
   Subjective:    Patient ID: Jamie HutchingShewanda L Allison, female    DOB: 01/13/1976, 39 y.o.   MRN: [id number]  HPI Patient seen for several items as follows  Headache for 3 days. Bifrontal. Dull quality. Over-the-counter Advil without much relief. Waking her from sleep. No associated nausea or vomiting. No focal neurologic symptoms. No visual changes.  Recurrent pruritic rash groin area. Previously has not responded well to topical antifungals. She has done well with fluconazole 100 mg daily for 7 days  Requesting STD screen.  symptoms of an occasional urine odor. She's not had any recent burning with urination. No rashes or excessive vaginal discharge. She same partner  Past Medical History  Diagnosis Date  . CHLAMYDIAL INFECTION 11/29/2009  . 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 DEPRESSION 10/20/2008  . HYPERTENSION 10/20/2008  . Polyuria 02/15/2010  . LUMBAR STRAIN, ACUTE 08/17/2009  . Sickle cell anemia     trait  . Herpes   . JJOACZYS(063.0Headache(784.0)    Past Surgical History  Procedure Laterality Date  . Cesarean section      X 3  . Tubal ligation    . Tonsillectomy and adenoidectomy      reports that she has never smoked. She has never used smokeless tobacco. She reports that she does not drink alcohol or use illicit drugs. family history includes Arthritis in her other; Depression in her other; Hypertension in her other. No Known Allergies    Review of Systems  Constitutional: Negative for fever, chills and fatigue.  Respiratory: Negative for cough and shortness of breath.   Cardiovascular: Negative for chest pain.  Genitourinary: Positive for dysuria. Negative for vaginal pain.  Neurological: Positive for headaches.       Objective:   Physical Exam  Constitutional: She is oriented to person, place, and time. She appears well-developed and well-nourished.  Neck: Neck supple.  Cardiovascular: Normal rate and regular rhythm.  Exam reveals no gallop.   Pulmonary/Chest: Effort normal and  breath sounds normal. No respiratory distress. She has no wheezes. She has no rales.  Neurological: She is alert and oriented to person, place, and time. No cranial nerve deficit.  Psychiatric: She has a normal mood and affect. Her behavior is normal.          Assessment & Plan:  #1 tension-type headache. Flexeril 10 mg daily at bedtime. Try moist heat and topical rubs. Touch base if headache not improving over the next week #2 STD screening. Check urine for GC and chlamydia. RPR and HIV #3 history of tinea cruris. Resistance to topical therapy. Fluconazole 100 mg daily for 7 days. Keep area dry as possible

## 2014-09-28 NOTE — Progress Notes (Signed)
Pre visit review using our clinic review tool, if applicable. No additional management support is needed unless otherwise documented below in the visit note. 

## 2014-09-29 LAB — RPR

## 2014-09-29 LAB — GC/CHLAMYDIA PROBE AMP, URINE
Chlamydia, Swab/Urine, PCR: NEGATIVE
GC Probe Amp, Urine: NEGATIVE

## 2014-09-29 LAB — HIV ANTIBODY (ROUTINE TESTING W REFLEX): HIV: NONREACTIVE

## 2014-11-23 ENCOUNTER — Emergency Department (HOSPITAL_COMMUNITY): Payer: BC Managed Care – PPO

## 2014-11-23 ENCOUNTER — Encounter (HOSPITAL_COMMUNITY): Payer: Self-pay | Admitting: *Deleted

## 2014-11-23 ENCOUNTER — Emergency Department (HOSPITAL_COMMUNITY)
Admission: EM | Admit: 2014-11-23 | Discharge: 2014-11-23 | Disposition: A | Discharge status: Home / Self Care | Payer: BC Managed Care – PPO | Attending: Emergency Medicine | Admitting: Emergency Medicine

## 2014-11-23 DIAGNOSIS — K529 Noninfective gastroenteritis and colitis, unspecified: Secondary | ICD-10-CM | POA: Diagnosis not present

## 2014-11-23 DIAGNOSIS — Z8619 Personal history of other infectious and parasitic diseases: Secondary | ICD-10-CM | POA: Diagnosis not present

## 2014-11-23 DIAGNOSIS — Z8659 Personal history of other mental and behavioral disorders: Secondary | ICD-10-CM | POA: Insufficient documentation

## 2014-11-23 DIAGNOSIS — Z862 Personal history of diseases of the blood and blood-forming organs and certain disorders involving the immune mechanism: Secondary | ICD-10-CM | POA: Diagnosis not present

## 2014-11-23 DIAGNOSIS — Z79899 Other long term (current) drug therapy: Secondary | ICD-10-CM | POA: Insufficient documentation

## 2014-11-23 DIAGNOSIS — I10 Essential (primary) hypertension: Secondary | ICD-10-CM | POA: Insufficient documentation

## 2014-11-23 DIAGNOSIS — R1110 Vomiting, unspecified: Secondary | ICD-10-CM | POA: Diagnosis present

## 2014-11-23 DIAGNOSIS — E669 Obesity, unspecified: Secondary | ICD-10-CM | POA: Diagnosis not present

## 2014-11-23 DIAGNOSIS — Z87828 Personal history of other (healed) physical injury and trauma: Secondary | ICD-10-CM | POA: Insufficient documentation

## 2014-11-23 DIAGNOSIS — Z3202 Encounter for pregnancy test, result negative: Secondary | ICD-10-CM | POA: Insufficient documentation

## 2014-11-23 LAB — COMPREHENSIVE METABOLIC PANEL
ALBUMIN: 3.8 g/dL (ref 3.5–5.0)
ALK PHOS: 49 U/L (ref 38–126)
ALT: 11 U/L — ABNORMAL LOW (ref 14–54)
AST: 15 U/L (ref 15–41)
Anion gap: 9 (ref 5–15)
BILIRUBIN TOTAL: 0.3 mg/dL (ref 0.3–1.2)
BUN: 7 mg/dL (ref 6–20)
CHLORIDE: 105 mmol/L (ref 101–111)
CO2: 23 mmol/L (ref 22–32)
CREATININE: 0.92 mg/dL (ref 0.44–1.00)
Calcium: 9.2 mg/dL (ref 8.9–10.3)
GFR calc Af Amer: >60 mL/min (ref >60)
GFR calc non Af Amer: >60 mL/min (ref >60)
Glucose, Bld: 108 mg/dL — ABNORMAL HIGH (ref 65–99)
POTASSIUM: 4 mmol/L (ref 3.5–5.1)
Sodium: 137 mmol/L (ref 135–145)
Total Protein: 7 g/dL (ref 6.5–8.1)

## 2014-11-23 LAB — CBC WITH DIFFERENTIAL/PLATELET
Basophils Absolute: 0 10*3/uL (ref 0.0–0.1)
Basophils Relative: 0 % (ref 0–1)
Eosinophils Absolute: 0.1 10*3/uL (ref 0.0–0.7)
Eosinophils Relative: 2 % (ref 0–5)
HCT: 34.8 % — ABNORMAL LOW (ref 36.0–46.0)
Hemoglobin: 11.7 g/dL — ABNORMAL LOW (ref 12.0–15.0)
LYMPHS ABS: 1.9 10*3/uL (ref 0.7–4.0)
LYMPHS PCT: 37 % (ref 12–46)
MCH: 27.7 pg (ref 26.0–34.0)
MCHC: 33.6 g/dL (ref 30.0–36.0)
MCV: 82.5 fL (ref 78.0–100.0)
MONO ABS: 0.3 10*3/uL (ref 0.1–1.0)
MONOS PCT: 7 % (ref 3–12)
NEUTROS ABS: 2.8 10*3/uL (ref 1.7–7.7)
NEUTROS PCT: 54 % (ref 43–77)
Platelets: 332 10*3/uL (ref 150–400)
RBC: 4.22 MIL/uL (ref 3.87–5.11)
RDW: 13.6 % (ref 11.5–15.5)
WBC: 5.2 10*3/uL (ref 4.0–10.5)

## 2014-11-23 LAB — POC URINE PREG, ED: Preg Test, Ur: NEGATIVE

## 2014-11-23 LAB — LIPASE, BLOOD: Lipase: 22 U/L (ref 22–51)

## 2014-11-23 LAB — URINE MICROSCOPIC-ADD ON

## 2014-11-23 LAB — URINALYSIS, ROUTINE W REFLEX MICROSCOPIC
BILIRUBIN URINE: NEGATIVE
Glucose, UA: NEGATIVE mg/dL
Ketones, ur: NEGATIVE mg/dL
Leukocytes, UA: NEGATIVE
NITRITE: NEGATIVE
Protein, ur: 30 mg/dL — AB
Specific Gravity, Urine: 1.014 (ref 1.005–1.030)
UROBILINOGEN UA: 0.2 mg/dL (ref 0.0–1.0)
pH: 6 (ref 5.0–8.0)

## 2014-11-23 LAB — I-STAT TROPONIN, ED: TROPONIN I, POC: 0 ng/mL (ref 0.00–0.08)

## 2014-11-23 MED ORDER — METOCLOPRAMIDE HCL 5 MG/ML IJ SOLN
10.0000 mg | Freq: Once | INTRAMUSCULAR | Status: AC
  Administered 2014-11-23: 10 mg via INTRAVENOUS
  Filled 2014-11-23: qty 2

## 2014-11-23 MED ORDER — METOCLOPRAMIDE HCL 10 MG PO TABS: 10.0000 mg | ORAL_TABLET | Freq: Three times a day (TID) | ORAL | Status: DC | PRN

## 2014-11-23 MED ORDER — SODIUM CHLORIDE 0.9 % IV BOLUS (SEPSIS)
1000.0000 mL | Freq: Once | INTRAVENOUS | Status: AC
  Administered 2014-11-23: 1000 mL via INTRAVENOUS

## 2014-11-23 NOTE — ED Notes (Signed)
Pt in from work reporting acute onset of generalized weakness & x2 vomiting episodes, pt denies hematemesis, pt denies diarrhea, pt A&O x4, follows commands, speaks in complete sentences, pt c/o current nausea

## 2014-11-23 NOTE — ED Provider Notes (Signed)
CSN: [id number]     Arrival date & time 11/23/14  [id number] History   First MD Initiated Contact with Patient 11/23/14 [phone]     Chief Complaint  Patient presents with  . Emesis     (Consider location/radiation/quality/duration/timing/severity/associated sxs/prior Treatment) HPI Patient reports he felt well at work this morning. One hour prior to coming here she developed epigastric pain, nonradiating vomited twice and had a loose bowel movement. She denies chest pain denies fever no treatment prior to coming here. No urinary symptoms. Nothing makes pain better or worse. No other associated symptoms. No weakness. No blood per rectum no hematemesis nothing makes symptoms better or worse. Pain is moderate at present. Last normal menstrual period 1.5 weeks ago Past Medical History  Diagnosis Date  . CHLAMYDIAL INFECTION 11/29/2009  . 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 DEPRESSION 10/20/2008  . HYPERTENSION 10/20/2008  . Polyuria 02/15/2010  . LUMBAR STRAIN, ACUTE 08/17/2009  . Sickle cell anemia     trait  . Herpes   . AOZHYQMV(784.6Headache(784.0)    Past Surgical History  Procedure Laterality Date  . Cesarean section      X 3  . Tubal ligation    . Tonsillectomy and adenoidectomy     Family History  Problem Relation Age of Onset  . Arthritis Other   . Hypertension Other   . Depression Other    History  Substance Use Topics  . Smoking status: Never Smoker   . Smokeless tobacco: Never Used  . Alcohol Use: No   OB History    No data available     Review of Systems  Gastrointestinal: Positive for nausea, vomiting and abdominal pain.  All other systems reviewed and are negative.     Allergies  Review of patient's allergies indicates no known allergies.  Home Medications   Prior to Admission medications   Medication Sig Start Date End Date Taking? Authorizing Provider  amLODipine (NORVASC) 5 MG tablet TAKE ONE TABLET BY MOUTH ONCE DAILY 06/27/14  Yes Kristian CoveyBruce W Burchette, MD  ibuprofen  (ADVIL,MOTRIN) 200 MG tablet Take 400 mg by mouth daily as needed for cramping.   Yes Historical Provider, MD  cyclobenzaprine (FLEXERIL) 10 MG tablet Take 1 tablet (10 mg total) by mouth 3 (three) times daily as needed for muscle spasms. 09/28/14   Kristian CoveyBruce W Burchette, MD  fluconazole (DIFLUCAN) 100 MG tablet Take 1 tablet (100 mg total) by mouth daily. 09/28/14   Kristian CoveyBruce W Burchette, MD  potassium chloride SA (K-DUR,KLOR-CON) 20 MEQ tablet Take 1 tablet (20 mEq total) by mouth daily. 05/12/14   Roderick PeeJeffrey A Todd, MD   BP 150/91 mmHg  Pulse 84  Temp(Src) 98.1 F (36.7 C) (Oral)  Resp 20  Ht 5\' 4"  (1.626 m)  Wt 205 lb (92.987 kg)  BMI 35.17 kg/m2  SpO2 100%  LMP 11/14/2014 Physical Exam  Constitutional: She appears well-developed and well-nourished. No distress.  HENT:  Head: Normocephalic and atraumatic.  Eyes: Conjunctivae are normal. Pupils are equal, round, and reactive to light.  Neck: Neck supple. No tracheal deviation present. No thyromegaly present.  Cardiovascular: Normal rate and regular rhythm.   No murmur heard. Pulmonary/Chest: Effort normal and breath sounds normal.  Abdominal: Soft. Bowel sounds are normal. She exhibits no distension. There is tenderness.  Obese. Tender at epigastrium. No right upper quadrant tenderness no right lower quadrant tenderness  Musculoskeletal: Normal range of motion. She exhibits no edema or tenderness.  Neurological: She is alert. Coordination normal.  Skin: Skin is  warm and dry. No rash noted.  Psychiatric: She has a normal mood and affect.  Nursing note and vitals reviewed.   ED Course  Procedures (including critical care time) Labs Review Labs Reviewed  CBC WITH DIFFERENTIAL/PLATELET - Abnormal; Notable for the following:    Hemoglobin 11.7 (*)    HCT 34.8 (*)    All other components within normal limits  COMPREHENSIVE METABOLIC PANEL - Abnormal; Notable for the following:    Glucose, Bld 108 (*)    ALT 11 (*)    All other components  within normal limits  LIPASE, BLOOD  URINALYSIS, ROUTINE W REFLEX MICROSCOPIC (NOT AT [facility])  I-STAT TROPOININ, ED    Imaging Review No results found.   EKG Interpretation   Date/Time:  Wednesday November 23 2014 09:14:03 EDT Ventricular Rate:  78 PR Interval:  180 QRS Duration: 105 QT Interval:  367 QTC Calculation: 418 R Axis:   64 Text Interpretation:  Sinus rhythm Borderline ST elevation, lateral leads  No significant change since last tracing Confirmed by   MD,   ([id number]) on 11/23/2014 9:28:38 AM      11:40 AM feels much improved after treatment with intravenous fluids and intravenous Reglan. She is able to drink without difficulty. Pain isn't much improved.  Results for orders placed or performed during the hospital encounter of 11/23/14  CBC with Differential  Result Value Ref Range   WBC 5.2 4.0 - 10.5 K/uL   RBC 4.22 3.87 - 5.11 MIL/uL   Hemoglobin 11.7 (L) 12.0 - 15.0 g/dL   HCT 16.1 (L) 09.6 - 04.5 %   MCV 82.5 78.0 - 100.0 fL   MCH 27.7 26.0 - 34.0 pg   MCHC 33.6 30.0 - 36.0 g/dL   RDW 40.9 81.1 - 91.4 %   Platelets 332 150 - 400 K/uL   Neutrophils Relative % 54 43 - 77 %   Neutro Abs 2.8 1.7 - 7.7 K/uL   Lymphocytes Relative 37 12 - 46 %   Lymphs Abs 1.9 0.7 - 4.0 K/uL   Monocytes Relative 7 3 - 12 %   Monocytes Absolute 0.3 0.1 - 1.0 K/uL   Eosinophils Relative 2 0 - 5 %   Eosinophils Absolute 0.1 0.0 - 0.7 K/uL   Basophils Relative 0 0 - 1 %   Basophils Absolute 0.0 0.0 - 0.1 K/uL  Comprehensive metabolic panel  Result Value Ref Range   Sodium 137 135 - 145 mmol/L   Potassium 4.0 3.5 - 5.1 mmol/L   Chloride 105 101 - 111 mmol/L   CO2 23 22 - 32 mmol/L   Glucose, Bld 108 (H) 65 - 99 mg/dL   BUN 7 6 - 20 mg/dL   Creatinine, Ser 7.82 0.44 - 1.00 mg/dL   Calcium 9.2 8.9 - 95.6 mg/dL   Total Protein 7.0 6.5 - 8.1 g/dL   Albumin 3.8 3.5 - 5.0 g/dL   AST 15 15 - 41 U/L   ALT 11 (L) 14 - 54 U/L   Alkaline Phosphatase 49 38 - 126 U/L   Total  Bilirubin 0.3 0.3 - 1.2 mg/dL   GFR calc non Af Amer >60 >60 mL/min   GFR calc Af Amer >60 >60 mL/min   Anion gap 9 5 - 15  Lipase, blood  Result Value Ref Range   Lipase 22 22 - 51 U/L  Urinalysis, Routine w reflex microscopic (not at [facility])  Result Value Ref Range   Color, Urine YELLOW YELLOW   APPearance  CLEAR CLEAR   Specific Gravity, Urine 1.014 1.005 - 1.030   pH 6.0 5.0 - 8.0   Glucose, UA NEGATIVE NEGATIVE mg/dL   Hgb urine dipstick TRACE (A) NEGATIVE   Bilirubin Urine NEGATIVE NEGATIVE   Ketones, ur NEGATIVE NEGATIVE mg/dL   Protein, ur 30 (A) NEGATIVE mg/dL   Urobilinogen, UA 0.2 0.0 - 1.0 mg/dL   Nitrite NEGATIVE NEGATIVE   Leukocytes, UA NEGATIVE NEGATIVE  Urine microscopic-add on  Result Value Ref Range   Squamous Epithelial / LPF RARE RARE   RBC / HPF 0-2 <3 RBC/hpf   Bacteria, UA RARE RARE   Casts HYALINE CASTS (A) NEGATIVE  I-stat troponin, ED (only if pt is 38 y.o. or older & pain is above umbilicus)  not at [facility]  Result Value Ref Range   Troponin i, poc 0.00 0.00 - 0.08 ng/mL   Comment 3          POC urine preg, ED (not at [facility])  Result Value Ref Range   Preg Test, Ur NEGATIVE NEGATIVE   Dg Abd Acute W/chest  11/23/2014   CLINICAL DATA:  39 year old female with acute onset weakness, vomiting, diarrhea, pain. Initial encounter.  EXAM: DG ABDOMEN ACUTE W/ 1V CHEST  COMPARISON:  Chest radiographs 05/02/2014. CT Abdomen and Pelvis 10/16/2010.  FINDINGS: Mildly low lung volumes. Normal cardiac size and mediastinal contours. No pneumothorax or pneumoperitoneum. The lungs are clear.  Non obstructed bowel gas pattern. Air-fluid levels in the abdomen appear primarily in the colon, which can be seen with diarrhea. Abdominal and pelvic visceral contours are within normal limits.  No osseous abnormality identified. External artifact projects over the left posterior first and second ribs.  IMPRESSION: 1.  Normal bowel gas pattern, no free air. 2. Lower lung volumes.  No  acute cardiopulmonary abnormality.   Electronically Signed   By: Odessa Fleming M.D.   On: 11/23/2014 11:11    MDM  I suspect gastroenteritis, with loose bowel movement and vomiting. Plan encourage hydration. Prescription Reglan. Follow-up Dr. Caryl Never if not better in 2 days. Avoid dairy Final diagnoses:  None        Doug Sou, MD 11/23/14 [id number]

## 2014-11-23 NOTE — ED Notes (Signed)
Pt up ambulatory to the bathroom at this time attempting to provide an urine sample

## 2014-11-23 NOTE — Discharge Instructions (Signed)
Viral Gastroenteritis Take the medication as needed for nausea or vomiting. You can take Imodium as directed for diarrhea. Avoid milk or foods containing milk such as cheese or ice cream. Drink at least six 8 ounce glasses of water or Gatorade daily to prevent dehydration. Contact Dr. Caryl Never if not improving in 2 days. return if your condition worsens for any reason Viral gastroenteritis is also known as stomach flu. This condition affects the stomach and intestinal tract. It can cause sudden diarrhea and vomiting. The illness typically lasts 3 to 8 days. Most people develop an immune response that eventually gets rid of the virus. While this natural response develops, the virus can make you quite ill. CAUSES  Many different viruses can cause gastroenteritis, such as rotavirus or noroviruses. You can catch one of these viruses by consuming contaminated food or water. You may also catch a virus by sharing utensils or other personal items with an infected person or by touching a contaminated surface. SYMPTOMS  The most common symptoms are diarrhea and vomiting. These problems can cause a severe loss of body fluids (dehydration) and a body salt (electrolyte) imbalance. Other symptoms may include:  Fever.  Headache.  Fatigue.  Abdominal pain. DIAGNOSIS  Your caregiver can usually diagnose viral gastroenteritis based on your symptoms and a physical exam. A stool sample may also be taken to test for the presence of viruses or other infections. TREATMENT  This illness typically goes away on its own. Treatments are aimed at rehydration. The most serious cases of viral gastroenteritis involve vomiting so severely that you are not able to keep fluids down. In these cases, fluids must be given through an intravenous line (IV). HOME CARE INSTRUCTIONS   Drink enough fluids to keep your urine clear or pale yellow. Drink small amounts of fluids frequently and increase the amounts as tolerated.  Ask your  caregiver for specific rehydration instructions.  Avoid:  Foods high in sugar.  Alcohol.  Carbonated drinks.  Tobacco.  Juice.  Caffeine drinks.  Extremely hot or cold fluids.  Fatty, greasy foods.  Too much intake of anything at one time.  Dairy products until 24 to 48 hours after diarrhea stops.  You may consume probiotics. Probiotics are active cultures of beneficial bacteria. They may lessen the amount and number of diarrheal stools in adults. Probiotics can be found in yogurt with active cultures and in supplements.  Wash your hands well to avoid spreading the virus.  Only take over-the-counter or prescription medicines for pain, discomfort, or fever as directed by your caregiver. Do not give aspirin to children. Antidiarrheal medicines are not recommended.  Ask your caregiver if you should continue to take your regular prescribed and over-the-counter medicines.  Keep all follow-up appointments as directed by your caregiver. SEEK IMMEDIATE MEDICAL CARE IF:   You are unable to keep fluids down.  You do not urinate at least once every 6 to 8 hours.  You develop shortness of breath.  You notice blood in your stool or vomit. This may look like coffee grounds.  You have abdominal pain that increases or is concentrated in one small area (localized).  You have persistent vomiting or diarrhea.  You have a fever.  The patient is a child younger than 3 months, and he or she has a fever.  The patient is a child older than 3 months, and he or she has a fever and persistent symptoms.  The patient is a child older than 3 months, and he or  she has a fever and symptoms suddenly get worse.  The patient is a baby, and he or she has no tears when crying. MAKE SURE YOU:   Understand these instructions.  Will watch your condition.  Will get help right away if you are not doing well or get worse. Document Released: 05/06/2005 Document Revised: 07/29/2011 Document  Reviewed: 02/20/2011 [facility] Patient Information 2015 [employer] [place]. This information is not intended to replace advice given to you by your health care provider. Make sure you discuss any questions you have with your health care provider.

## 2015-01-17 ENCOUNTER — Telehealth: Payer: Self-pay | Admitting: Family Medicine

## 2015-01-17 NOTE — Telephone Encounter (Signed)
Left message for patient to return call.

## 2015-01-17 NOTE — Telephone Encounter (Signed)
I do not recall who she has seen and do not see any outside records

## 2015-01-17 NOTE — Telephone Encounter (Signed)
Pt call to say she is having eye issues again and is asking if she need to come in.  She said Dr Caryl Never had  referred her to a eye doctor and she does not remember the doctors and is asking who that doctor was.

## 2015-02-02 ENCOUNTER — Telehealth: Payer: Self-pay | Admitting: Family Medicine

## 2015-02-02 ENCOUNTER — Encounter (HOSPITAL_COMMUNITY): Payer: Self-pay | Admitting: Emergency Medicine

## 2015-02-02 ENCOUNTER — Emergency Department (HOSPITAL_COMMUNITY)
Admission: EM | Admit: 2015-02-02 | Discharge: 2015-02-02 | Discharge status: Against Medical Advice | Payer: BC Managed Care – PPO | Attending: Emergency Medicine | Admitting: Emergency Medicine

## 2015-02-02 DIAGNOSIS — R51 Headache: Secondary | ICD-10-CM | POA: Diagnosis not present

## 2015-02-02 DIAGNOSIS — I10 Essential (primary) hypertension: Secondary | ICD-10-CM | POA: Diagnosis not present

## 2015-02-02 NOTE — Telephone Encounter (Signed)
[facility] - Client TELEPHONE ADVICE RECORD [facility]  Patient Name: ARLYNE BRANDES  DOB: [birth year]    Initial Comment Caller states she has had a headache for the past two day.    Nurse Assessment  Nurse: Phylliss Bob, RN, Synetta Fail Date/Time Lamount Cohen Time): 02/02/2015 4:12:28 PM  Confirm and document reason for call. If symptomatic, describe symptoms. ---Caller states she has had a headache for the past two day. caller state that today has been the worse and has been taking the Ibuprofen and has rated at 9/10 at present and has been constant at present  Has the patient traveled out of the country within the last 30 days? ---No  Does the patient require triage? ---Yes  Related visit to physician within the last 2 weeks? ---No  Does the PT have any chronic conditions? (i.e. diabetes, asthma, etc.) ---Yes  List chronic conditions. ---HTN sickle cell trait  Did the patient indicate they were pregnant? ---No     Guidelines    Guideline Title Affirmed Question Affirmed Notes  Headache [1] SEVERE headache (e.g., excruciating) AND [2] "worst headache" of life pain rated at 9/10   Final Disposition User   Go to ED Now (or PCP triage) Phylliss Bob, RN, Synetta Fail    Comments  call placed to the office back line at [phone] and spoke with Okey Regal and informed her of the patient status and the need to be seen and she checked with the scheduler and she directed that she would need to transfer to the nurse Durward Mallard and was informed of the patient status and the outcome and she directed to have the caller to proceed to the ED/UC   Referrals  [facility] - ED   Disagree/Comply: Comply

## 2015-02-02 NOTE — Telephone Encounter (Signed)
FYI

## 2015-02-02 NOTE — Telephone Encounter (Signed)
Per Dr. B patient to go to the ED. Pt checked into the ED

## 2015-02-02 NOTE — ED Notes (Signed)
Pt sts right sided HA x 2 days with some blurry vision

## 2015-05-11 ENCOUNTER — Encounter (HOSPITAL_COMMUNITY): Payer: Self-pay

## 2015-05-11 ENCOUNTER — Emergency Department (HOSPITAL_COMMUNITY)
Admission: EM | Admit: 2015-05-11 | Discharge: 2015-05-11 | Disposition: A | Discharge status: Home / Self Care | Payer: BC Managed Care – PPO | Attending: Emergency Medicine | Admitting: Emergency Medicine

## 2015-05-11 ENCOUNTER — Emergency Department (HOSPITAL_COMMUNITY): Payer: BC Managed Care – PPO

## 2015-05-11 DIAGNOSIS — E669 Obesity, unspecified: Secondary | ICD-10-CM | POA: Insufficient documentation

## 2015-05-11 DIAGNOSIS — M542 Cervicalgia: Secondary | ICD-10-CM | POA: Diagnosis not present

## 2015-05-11 DIAGNOSIS — M25511 Pain in right shoulder: Secondary | ICD-10-CM | POA: Insufficient documentation

## 2015-05-11 DIAGNOSIS — Z8619 Personal history of other infectious and parasitic diseases: Secondary | ICD-10-CM | POA: Insufficient documentation

## 2015-05-11 DIAGNOSIS — M79621 Pain in right upper arm: Secondary | ICD-10-CM | POA: Insufficient documentation

## 2015-05-11 DIAGNOSIS — Z79899 Other long term (current) drug therapy: Secondary | ICD-10-CM | POA: Diagnosis not present

## 2015-05-11 DIAGNOSIS — R0789 Other chest pain: Secondary | ICD-10-CM | POA: Diagnosis not present

## 2015-05-11 DIAGNOSIS — R079 Chest pain, unspecified: Secondary | ICD-10-CM | POA: Diagnosis present

## 2015-05-11 DIAGNOSIS — I10 Essential (primary) hypertension: Secondary | ICD-10-CM | POA: Insufficient documentation

## 2015-05-11 DIAGNOSIS — Z87828 Personal history of other (healed) physical injury and trauma: Secondary | ICD-10-CM | POA: Insufficient documentation

## 2015-05-11 DIAGNOSIS — Z8659 Personal history of other mental and behavioral disorders: Secondary | ICD-10-CM | POA: Insufficient documentation

## 2015-05-11 DIAGNOSIS — Z862 Personal history of diseases of the blood and blood-forming organs and certain disorders involving the immune mechanism: Secondary | ICD-10-CM | POA: Insufficient documentation

## 2015-05-11 DIAGNOSIS — M79601 Pain in right arm: Secondary | ICD-10-CM

## 2015-05-11 MED ORDER — DIAZEPAM 2 MG PO TABS
2.0000 mg | ORAL_TABLET | Freq: Once | ORAL | Status: AC
  Administered 2015-05-11: 2 mg via ORAL
  Filled 2015-05-11: qty 1

## 2015-05-11 MED ORDER — KETOROLAC TROMETHAMINE 60 MG/2ML IM SOLN
60.0000 mg | Freq: Once | INTRAMUSCULAR | Status: AC
  Administered 2015-05-11: 60 mg via INTRAMUSCULAR
  Filled 2015-05-11: qty 2

## 2015-05-11 NOTE — Discharge Instructions (Signed)
Acute Torticollis Jamie Allison, your EKG and chest xray today were normal.  Take motrin at home as needed for your pain and see your primary doctor within 3 days for close follow up.  If symptoms worsen, come back to the ED immediately.  Thank you. Torticollis is a condition in which the muscles of the neck tighten (contract) abnormally, causing the neck to twist and the head to move into an unnatural position. Torticollis that develops suddenly is called acute torticollis. If torticollis becomes chronic and is left untreated, the face and neck can become deformed. CAUSES This condition may be caused by:  Sleeping in an awkward position (common).  Extending or twisting the neck muscles beyond their normal position.  Infection. In some cases, the cause may not be known. SYMPTOMS Symptoms of this condition include:  An unnatural position of the head.  Neck pain.  A limited ability to move the neck.  Twisting of the neck to one side. DIAGNOSIS This condition is diagnosed with a physical exam. You may also have imaging tests, such as an X-ray, CT scan, or MRI. TREATMENT Treatment for this condition involves trying to relax the neck muscles. It may include:  Medicines or shots.  Physical therapy.  Surgery. This may be done in severe cases. HOME CARE INSTRUCTIONS  Take medicines only as directed by your health care provider.  Do stretching exercises and massage your neck as directed by your health care provider.  Keep all follow-up visits as directed by your health care provider. This is important. SEEK MEDICAL CARE IF:  You develop a fever. SEEK IMMEDIATE MEDICAL CARE IF:  You develop difficulty breathing.  You develop noisy breathing (stridor).  You start drooling.  You have trouble swallowing or have pain with swallowing.  You develop numbness or weakness in your hands or feet.  You have changes in your speech, understanding, or vision.  Your pain gets worse.     This information is not intended to replace advice given to you by your health care provider. Make sure you discuss any questions you have with your health care provider.   Document Released: 05/03/2000 Document Revised: 09/20/2014 Document Reviewed: 05/02/2014 Elsevier Interactive Patient Education 2016 Elsevier Inc. Nonspecific Chest Pain It is often hard to find the cause of chest pain. There is always a chance that your pain could be related to something serious, such as a heart attack or a blood clot in your lungs. Chest pain can also be caused by conditions that are not life-threatening. If you have chest pain, it is very important to follow up with your doctor.  HOME CARE  If you were prescribed an antibiotic medicine, finish it all even if you start to feel better.  Avoid any activities that cause chest pain.  Do not use any tobacco products, including cigarettes, chewing tobacco, or electronic cigarettes. If you need help quitting, ask your doctor.  Do not drink alcohol.  Take medicines only as told by your doctor.  Keep all follow-up visits as told by your doctor. This is important. This includes any further testing if your chest pain does not go away.  Your doctor may tell you to keep your head raised (elevated) while you sleep.  Make lifestyle changes as told by your doctor. These may include:  Getting regular exercise. Ask your doctor to suggest some activities that are safe for you.  Eating a heart-healthy diet. Your doctor or a diet specialist (dietitian) can help you to learn healthy eating  options.  Maintaining a healthy weight.  Managing diabetes, if necessary.  Reducing stress. GET HELP IF:  Your chest pain does not go away, even after treatment.  You have a rash with blisters on your chest.  You have a fever. GET HELP RIGHT AWAY IF:  Your chest pain is worse.  You have an increasing cough, or you cough up blood.  You have severe belly (abdominal)  pain.  You feel extremely weak.  You pass out (faint).  You have chills.  You have sudden, unexplained chest discomfort.  You have sudden, unexplained discomfort in your arms, back, neck, or jaw.  You have shortness of breath at any time.  You suddenly start to sweat, or your skin gets clammy.  You feel nauseous.  You vomit.  You suddenly feel light-headed or dizzy.  Your heart begins to beat quickly, or it feels like it is skipping beats. These symptoms may be an emergency. Do not wait to see if the symptoms will go away. Get medical help right away. Call your local emergency services (911 in the [place] Do not drive yourself to the hospital.   This information is not intended to replace advice given to you by your health care provider. Make sure you discuss any questions you have with your health care provider.   Document Released: 10/23/2007 Document Revised: 05/27/2014 Document Reviewed: 12/10/2013 Elsevier Interactive Patient Education [employer] Elsevier Inc.

## 2015-05-11 NOTE — ED Provider Notes (Signed)
CSN: [id number]     Arrival date & time 05/11/15  0436 History   First MD Initiated Contact with Patient 05/11/15 0447     Chief Complaint  Patient presents with  . Chest Pain  . Shortness of Breath     (Consider location/radiation/quality/duration/timing/severity/associated sxs/prior Treatment) HPI  Ms. Jamie Allison is a 39yo female, PMH HTN, here with R side body aches.  Patient states she woke up out of bed withR neck, shoulder, chest, and RUE pain. She states it feels like a muscle ache and is worse with movement and in certain positions.  She denies ever having this in the past.  She did not take any medications at home for relief.  She denies any recent trauma to the area.  She has no alleviating factors.  Pain is not pleuritic, nor does it worsen with exertion.  She denies h/o ACS, PE, long distance travel, recent surgery, or estrogen use.  There are no further complaints.  10 Systems reviewed and are negative for acute change except as noted in the HPI.     Past Medical History  Diagnosis Date  . CHLAMYDIAL INFECTION 11/29/2009  . 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 DEPRESSION 10/20/2008  . HYPERTENSION 10/20/2008  . Polyuria 02/15/2010  . LUMBAR STRAIN, ACUTE 08/17/2009  . Sickle cell anemia (HCC)     trait  . Herpes   . WUJWJXBJ(478.2Headache(784.0)    Past Surgical History  Procedure Laterality Date  . Cesarean section      X 3  . Tubal ligation    . Tonsillectomy and adenoidectomy     Family History  Problem Relation Age of Onset  . Arthritis Other   . Hypertension Other   . Depression Other    Social History  Substance Use Topics  . Smoking status: Never Smoker   . Smokeless tobacco: Never Used  . Alcohol Use: No   OB History    No data available     Review of Systems    Allergies  Review of patient's allergies indicates no known allergies.  Home Medications   Prior to Admission medications   Medication Sig Start Date End Date Taking? Authorizing Provider  amLODipine  (NORVASC) 5 MG tablet TAKE ONE TABLET BY MOUTH ONCE DAILY 06/27/14  Yes Kristian CoveyBruce W Burchette, MD  cyclobenzaprine (FLEXERIL) 10 MG tablet Take 1 tablet (10 mg total) by mouth 3 (three) times daily as needed for muscle spasms. 09/28/14  Yes Kristian CoveyBruce W Burchette, MD  ibuprofen (ADVIL,MOTRIN) 200 MG tablet Take 400 mg by mouth daily as needed for cramping.   Yes Historical Provider, MD  metoCLOPramide (REGLAN) 10 MG tablet Take 1 tablet (10 mg total) by mouth every 8 (eight) hours as needed for nausea. 11/23/14  Yes Sam Ethelda ChickJacubowitz, MD   BP 146/88 mmHg  Pulse 64  Temp(Src) 98 F (36.7 C) (Oral)  Resp 13  Ht 5\' 4"  (1.626 m)  Wt 205 lb (92.987 kg)  BMI 35.17 kg/m2  SpO2 100%  LMP 04/20/2015 Physical Exam  Constitutional: She is oriented to person, place, and time. She appears well-developed and well-nourished. No distress.  HENT:  Head: Normocephalic and atraumatic.  Nose: Nose normal.  Mouth/Throat: Oropharynx is clear and moist. No oropharyngeal exudate.  Eyes: Conjunctivae and EOM are normal. Pupils are equal, round, and reactive to light. No scleral icterus.  Neck: Normal range of motion. Neck supple. No JVD present. No tracheal deviation present. No thyromegaly present.  Cardiovascular: Normal rate, regular rhythm and normal heart sounds.  Exam reveals no gallop and no friction rub.   No murmur heard. Pulmonary/Chest: Effort normal and breath sounds normal. No respiratory distress. She has no wheezes. She exhibits no tenderness.  Abdominal: Soft. Bowel sounds are normal. She exhibits no distension and no mass. There is no tenderness. There is no rebound and no guarding.  Musculoskeletal: Normal range of motion. She exhibits no edema or tenderness.  Lymphadenopathy:    She has no cervical adenopathy.  Neurological: She is alert and oriented to person, place, and time. No cranial nerve deficit. She exhibits normal muscle tone.  Skin: Skin is warm and dry. No rash noted. No erythema. No pallor.   Nursing note and vitals reviewed.   ED Course  Procedures (including critical care time) Labs Review Labs Reviewed - No data to display  Imaging Review Dg Chest 2 View  05/11/2015  CLINICAL DATA:  Chest pain EXAM: CHEST  2 VIEW COMPARISON:  11/23/2014 FINDINGS: The heart size and mediastinal contours are within normal limits. Both lungs are clear. No effusion or pneumothorax. The visualized skeletal structures are unremarkable. Seashells and braids are seen over the neck. IMPRESSION: Negative chest. Electronically Signed   By: Marnee Spring M.D.   On: 05/11/2015 05:48   I have personally reviewed and evaluated these images and lab results as part of my medical decision-making.   EKG Interpretation   Date/Time:  Thursday May 11 2015 04:43:30 EST Ventricular Rate:  61 PR Interval:  212 QRS Duration: 100 QT Interval:  388 QTC Calculation: 391 R Axis:   69 Text Interpretation:  Sinus arrhythmia Prolonged PR interval Baseline  wander in lead(s) II No significant change since last tracing Confirmed by  Erroll Luna [phone]) on 05/11/2015 5:19:31 AM      MDM   Final diagnoses:  None   Patient presents to the ED for R side body pain.  History is consistent with a musculoskeletal cause of her pain.  She states she does not have comfortable pillows and they may have caused her discomfort.  EKG does not reveal any concern for ACS or PE.  History is not consistent with these diagnoses either.  She was given toradol and low dose valium for her pain.CXR is negative.  She is PERC negative.  Pain has improved with medication, she appears well and in NAD.  VS remain within her normal limits and she is safe for DC    Tomasita Crumble, MD 05/11/15 [phone]

## 2015-05-11 NOTE — ED Notes (Signed)
Pt woke up around 2am with CP and SOB was able to go back to sleep and then woke up at 4am with the same pain. Pain in r chest with radiation to R arm, c/o SOB, denies n/v, denies heavy lifting.

## 2015-08-16 ENCOUNTER — Ambulatory Visit (INDEPENDENT_AMBULATORY_CARE_PROVIDER_SITE_OTHER): Payer: BC Managed Care – PPO | Admitting: Family Medicine

## 2015-08-16 VITALS — BP 150/80 | HR 78 | Temp 98.7°F | Ht 64.0 in | Wt 208.0 lb

## 2015-08-16 DIAGNOSIS — R21 Rash and other nonspecific skin eruption: Secondary | ICD-10-CM | POA: Diagnosis not present

## 2015-08-16 DIAGNOSIS — I10 Essential (primary) hypertension: Secondary | ICD-10-CM | POA: Diagnosis not present

## 2015-08-16 DIAGNOSIS — G44219 Episodic tension-type headache, not intractable: Secondary | ICD-10-CM

## 2015-08-16 MED ORDER — FLUCONAZOLE 100 MG PO TABS: 100.0000 mg | ORAL_TABLET | Freq: Every day | ORAL | Status: DC

## 2015-08-16 MED ORDER — CYCLOBENZAPRINE HCL 10 MG PO TABS: 10.0000 mg | ORAL_TABLET | Freq: Every day | ORAL | Status: DC

## 2015-08-16 MED ORDER — AMLODIPINE BESYLATE 5 MG PO TABS: 5.0000 mg | ORAL_TABLET | Freq: Every day | ORAL | Status: DC

## 2015-08-16 NOTE — Progress Notes (Signed)
Pre visit review using our clinic review tool, if applicable. No additional management support is needed unless otherwise documented below in the visit note. 

## 2015-08-16 NOTE — Progress Notes (Signed)
   Subjective:    Patient ID: Jamie Allison, female    DOB: 05-Jun-1975, 40 y.o.   MRN: [id number]  HPI Patient seen for the following several items  Hypertension. Has not been taking her blood pressure medication which is amlodipine 5 mg daily for several months. She states she stopped this secondary to cost. She denied any side effects. Not monitoring blood pressures recently at home. Has had some frequent headaches as below. No chest pains. No peripheral edema. No dyspnea.  Headaches. Similar headaches in the past. Describes dull bifrontal headache. She thinks possibly stress related. Increased upper back and neck muscle tension. Previously responded well to Flexeril.  Recurrent skin rash groin region bilaterally. Tried topical nystatin without improvement. Previously took Diflucan 100 mg daily for 7 days which resolved her rash. No recent antibiotic use. No recent prednisone use. No history of diabetes.  Past Medical History  Diagnosis Date  . CHLAMYDIAL INFECTION 11/29/2009  . 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 DEPRESSION 10/20/2008  . HYPERTENSION 10/20/2008  . Polyuria 02/15/2010  . LUMBAR STRAIN, ACUTE 08/17/2009  . Sickle cell anemia (HCC)     trait  . Herpes   . WUJWJXBJ(478.2Headache(784.0)    Past Surgical History  Procedure Laterality Date  . Cesarean section      X 3  . Tubal ligation    . Tonsillectomy and adenoidectomy      reports that she has never smoked. She has never used smokeless tobacco. She reports that she does not drink alcohol or use illicit drugs. family history includes Arthritis in her other; Depression in her other; Hypertension in her other. No Known Allergies    Review of Systems  Constitutional: Positive for fatigue.  Eyes: Negative for visual disturbance.  Respiratory: Negative for cough, chest tightness, shortness of breath and wheezing.   Cardiovascular: Negative for chest pain, palpitations and leg swelling.  Skin: Positive for rash.  Neurological:  Positive for headaches. Negative for dizziness, seizures, syncope, weakness and light-headedness.       Objective:   Physical Exam  Constitutional: She is oriented to person, place, and time. She appears well-developed and well-nourished.  Neck: Neck supple. No thyromegaly present.  Cardiovascular: Normal rate and regular rhythm.  Exam reveals no gallop.   No murmur heard. Pulmonary/Chest: Effort normal and breath sounds normal. No respiratory distress. She has no wheezes. She has no rales.  Musculoskeletal: She exhibits no edema.  Neurological: She is alert and oriented to person, place, and time. No cranial nerve deficit. Coordination normal.          Assessment & Plan:  #1 hypertension. Currently untreated. She is strongly advised him back on amlodipine 5 mgs daily with prescription written. Reassess at physical in April  #2 acute tension-type headache. Try to get more sleep. Flexeril 10 mg daily at bedtime when necessary which has helped her previously  #3 recurrent groin rash. Probably fungal. Refill fluconazole 100 mg once daily for 7 days

## 2015-08-23 ENCOUNTER — Other Ambulatory Visit: Payer: Self-pay | Admitting: Family Medicine

## 2015-08-23 NOTE — Telephone Encounter (Signed)
If current rash not cleared with one week of Fluconazole (prescribed last week), need to follow up and reassess.

## 2015-08-23 NOTE — Telephone Encounter (Signed)
Pt DX with a rash on 08-16-2015, treated with Fluconazole. Please advise on refill.

## 2015-08-31 ENCOUNTER — Ambulatory Visit (INDEPENDENT_AMBULATORY_CARE_PROVIDER_SITE_OTHER): Payer: BC Managed Care – PPO | Admitting: Family Medicine

## 2015-08-31 ENCOUNTER — Other Ambulatory Visit (HOSPITAL_COMMUNITY)
Admission: RE | Admit: 2015-08-31 | Discharge: 2015-08-31 | Disposition: A | Discharge status: Home / Self Care | Payer: BC Managed Care – PPO | Source: Ambulatory Visit | Attending: Family Medicine | Admitting: Family Medicine

## 2015-08-31 VITALS — BP 170/100 | HR 80 | Temp 98.9°F | Ht 64.0 in | Wt 205.6 lb

## 2015-08-31 DIAGNOSIS — Z01419 Encounter for gynecological examination (general) (routine) without abnormal findings: Secondary | ICD-10-CM | POA: Diagnosis present

## 2015-08-31 DIAGNOSIS — Z0000 Encounter for general adult medical examination without abnormal findings: Secondary | ICD-10-CM

## 2015-08-31 LAB — HEPATIC FUNCTION PANEL
ALT: 8 U/L (ref 0–35)
AST: 12 U/L (ref 0–37)
Albumin: 4.5 g/dL (ref 3.5–5.2)
Alkaline Phosphatase: 51 U/L (ref 39–117)
Bilirubin, Direct: 0.1 mg/dL (ref 0.0–0.3)
Total Bilirubin: 0.6 mg/dL (ref 0.2–1.2)
Total Protein: 7.3 g/dL (ref 6.0–8.3)

## 2015-08-31 LAB — LIPID PANEL
CHOLESTEROL: 174 mg/dL (ref 0–200)
HDL: 47 mg/dL (ref >39.00)
LDL Cholesterol: 116 mg/dL — ABNORMAL HIGH (ref 0–99)
NonHDL: 126.67
Total CHOL/HDL Ratio: 4
Triglycerides: 54 mg/dL (ref 0.0–149.0)
VLDL: 10.8 mg/dL (ref 0.0–40.0)

## 2015-08-31 LAB — CBC WITH DIFFERENTIAL/PLATELET
BASOS PCT: 0.5 % (ref 0.0–3.0)
Basophils Absolute: 0 10*3/uL (ref 0.0–0.1)
EOS ABS: 0.1 10*3/uL (ref 0.0–0.7)
EOS PCT: 1.8 % (ref 0.0–5.0)
HCT: 35 % — ABNORMAL LOW (ref 36.0–46.0)
HEMOGLOBIN: 11.8 g/dL — AB (ref 12.0–15.0)
LYMPHS PCT: 34.4 % (ref 12.0–46.0)
Lymphs Abs: 2.4 10*3/uL (ref 0.7–4.0)
MCHC: 33.9 g/dL (ref 30.0–36.0)
MCV: 82.4 fl (ref 78.0–100.0)
Monocytes Absolute: 0.5 10*3/uL (ref 0.1–1.0)
Monocytes Relative: 7 % (ref 3.0–12.0)
Neutro Abs: 3.9 10*3/uL (ref 1.4–7.7)
Neutrophils Relative %: 56.3 % (ref 43.0–77.0)
Platelets: 422 10*3/uL — ABNORMAL HIGH (ref 150.0–400.0)
RBC: 4.24 Mil/uL (ref 3.87–5.11)
RDW: 13.7 % (ref 11.5–15.5)
WBC: 6.9 10*3/uL (ref 4.0–10.5)

## 2015-08-31 LAB — BASIC METABOLIC PANEL
BUN: 8 mg/dL (ref 6–23)
CO2: 25 mEq/L (ref 19–32)
CREATININE: 0.63 mg/dL (ref 0.40–1.20)
Calcium: 9.9 mg/dL (ref 8.4–10.5)
Chloride: 105 mEq/L (ref 96–112)
GFR: 135.74 mL/min (ref >60.00)
Glucose, Bld: 87 mg/dL (ref 70–99)
Potassium: 3.6 mEq/L (ref 3.5–5.1)
Sodium: 138 mEq/L (ref 135–145)

## 2015-08-31 LAB — TSH: TSH: 0.77 u[IU]/mL (ref 0.35–4.50)

## 2015-08-31 MED ORDER — AMLODIPINE BESYLATE 10 MG PO TABS: 10.0000 mg | ORAL_TABLET | Freq: Every day | ORAL | Status: DC

## 2015-08-31 NOTE — Progress Notes (Signed)
Pre visit review using our clinic review tool, if applicable. No additional management support is needed unless otherwise documented below in the visit note. 

## 2015-08-31 NOTE — Patient Instructions (Signed)
Go ahead and increase amlodipine to 10 mg once daily  Try to lose some weight.  Engage in regular aerobic exercise such as walking is much as possible  Watch sodium intake  Let's plan blood pressure follow-up in one month

## 2015-09-02 MED ORDER — NYSTATIN 100000 UNIT/GM EX POWD: 1.0000 | Freq: Two times a day (BID) | CUTANEOUS | Status: DC

## 2015-09-02 NOTE — Progress Notes (Signed)
Subjective:    Patient ID: Jamie Allison, female    DOB: [birth year], 40 y.o.   MRN: [id number]  HPI Patient here for CPE.   Hx of obesity and hypertension. On Amlodipine.  Not monitoring blood pressures. Remote hx of abnormal pap but not past couple .   Tetanus up to date.  Stays very busy with work and raising family as single parent. Very little time for exercise. Nonsmoker.    Past Medical History  Diagnosis Date  . CHLAMYDIAL INFECTION 11/29/2009  . 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 DEPRESSION 10/20/2008  . HYPERTENSION 10/20/2008  . Polyuria 02/15/2010  . LUMBAR STRAIN, ACUTE 08/17/2009  . Sickle cell anemia (HCC)     trait  . Herpes   . WUJWJXBJ(478.2)    Past Surgical History  Procedure Laterality Date  . Cesarean section      X 3  . Tubal ligation    . Tonsillectomy and adenoidectomy      reports that she has never smoked. She has never used smokeless tobacco. She reports that she does not drink alcohol or use illicit drugs. family history includes Arthritis in her other; Depression in her other; Hypertension in her other. No Known Allergies     Review of Systems  Constitutional: Negative for fever, activity change, appetite change, fatigue and unexpected weight change.  HENT: Negative for ear pain, hearing loss, sore throat and trouble swallowing.   Eyes: Negative for visual disturbance.  Respiratory: Negative for cough and shortness of breath.   Cardiovascular: Negative for chest pain and palpitations.  Gastrointestinal: Negative for abdominal pain, diarrhea, constipation and blood in stool.  Endocrine: Negative for polydipsia and polyuria.  Genitourinary: Negative for dysuria and hematuria.  Musculoskeletal: Negative for myalgias, back pain and arthralgias.  Skin: Positive for rash.       Persistent groin rash-bilateral.  Neurological: Negative for dizziness, syncope and headaches.  Hematological: Negative for adenopathy.  Psychiatric/Behavioral:  Negative for confusion and dysphoric mood.       Objective:   Physical Exam  Constitutional: She is oriented to person, place, and time. She appears well-developed and well-nourished.  HENT:  Head: Normocephalic and atraumatic.  Eyes: EOM are normal. Pupils are equal, round, and reactive to light.  Neck: Normal range of motion. Neck supple. No thyromegaly present.  Cardiovascular: Normal rate, regular rhythm and normal heart sounds.   No murmur heard. Pulmonary/Chest: Breath sounds normal. No respiratory distress. She has no wheezes. She has no rales.  Abdominal: Soft. Bowel sounds are normal. She exhibits no distension and no mass. There is no tenderness. There is no rebound and no guarding.  Genitourinary: Vagina normal and uterus normal. No vaginal discharge found.  Cervix normal appearance.  Pap obtained with cytobroom and cytobrush.   Breasts symmetric with no mass.  Musculoskeletal: Normal range of motion. She exhibits no edema.  Lymphadenopathy:    She has no cervical adenopathy.  Neurological: She is alert and oriented to person, place, and time. She displays normal reflexes. No cranial nerve deficit.  Skin: Rash noted.  Pt has slightly hypopigmented area groin bilateral..  Non-scaly.  Psychiatric: She has a normal mood and affect. Her behavior is normal. Judgment and thought content normal.          Assessment & Plan:  Physical exam.  Pap obtained.  Pt will get blood work today.  Nystatin powder for bilateral groin rash-?tinea cruris.  Hypertension is poorly controlled.  Increase Amlodipine to 10 mg daily.  Lose  some weight.  Follow up in one month to reassess.

## 2015-09-04 LAB — CYTOLOGY - PAP

## 2015-09-04 MED ORDER — CENTRUM PO CHEW: 1.0000 | CHEWABLE_TABLET | Freq: Every day | ORAL | Status: AC

## 2015-09-04 NOTE — Addendum Note (Signed)
Addended by: Tempie HoistMCNEIL,  M on: 09/04/2015 09:14 AM   Modules accepted: Orders, SmartSet

## 2015-10-02 ENCOUNTER — Ambulatory Visit (INDEPENDENT_AMBULATORY_CARE_PROVIDER_SITE_OTHER): Payer: BC Managed Care – PPO | Admitting: Family Medicine

## 2015-10-02 VITALS — BP 132/86 | HR 99 | Temp 98.7°F | Ht 64.0 in | Wt 207.2 lb

## 2015-10-02 DIAGNOSIS — I10 Essential (primary) hypertension: Secondary | ICD-10-CM

## 2015-10-02 NOTE — Progress Notes (Signed)
Pre visit review using our clinic review tool, if applicable. No additional management support is needed unless otherwise documented below in the visit note. 

## 2015-10-02 NOTE — Progress Notes (Signed)
   Subjective:    Patient ID: Jamie Allison, female    DOB: 1976-02-05, 40 y.o.   MRN: [id number]  HPI  Follow-up hypertension.  We started amlodipine 10 mg daily.  Recent blood pressure 170/100.  She is taking medication without side effects. No edema. No headaches.  She started recent multivitamin with iron and she's had slightly more energy since starting that.  Past Medical History  Diagnosis Date  . CHLAMYDIAL INFECTION 11/29/2009  . 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 DEPRESSION 10/20/2008  . HYPERTENSION 10/20/2008  . Polyuria 02/15/2010  . LUMBAR STRAIN, ACUTE 08/17/2009  . Sickle cell anemia (HCC)     trait  . Herpes   . JYNWGNFA(213.0Headache(784.0)    Past Surgical History  Procedure Laterality Date  . Cesarean section      X 3  . Tubal ligation    . Tonsillectomy and adenoidectomy      reports that she has never smoked. She has never used smokeless tobacco. She reports that she does not drink alcohol or use illicit drugs. family history includes Arthritis in her other; Depression in her other; Hypertension in her other. No Known Allergies    Review of Systems  Constitutional: Negative for fatigue.  Eyes: Negative for visual disturbance.  Respiratory: Negative for cough, chest tightness, shortness of breath and wheezing.   Cardiovascular: Negative for chest pain, palpitations and leg swelling.  Endocrine: Negative for polydipsia and polyuria.  Neurological: Negative for dizziness, seizures, syncope, weakness, light-headedness and headaches.       Objective:   Physical Exam  Constitutional: She appears well-developed and well-nourished. No distress.  Cardiovascular: Normal rate and regular rhythm.  Exam reveals no gallop.   Pulmonary/Chest: Effort normal and breath sounds normal. No respiratory distress. She has no wheezes. She has no rales.  Musculoskeletal: She exhibits no edema.          Assessment & Plan:   Hypertension. Greatly improved with addition of  amlodipine.  We discussed nonpharmacologic ways of helping manage blood pressure. She has made some very positive dietary changes. Work on [employer] control.  Kristian CoveyBruce W  MD [facility]

## 2016-01-17 ENCOUNTER — Encounter (HOSPITAL_COMMUNITY): Payer: Self-pay | Admitting: Emergency Medicine

## 2016-01-17 ENCOUNTER — Emergency Department (HOSPITAL_COMMUNITY)
Admission: EM | Admit: 2016-01-17 | Discharge: 2016-01-17 | Disposition: A | Discharge status: Home / Self Care | Payer: BC Managed Care – PPO | Attending: Emergency Medicine | Admitting: Emergency Medicine

## 2016-01-17 DIAGNOSIS — I10 Essential (primary) hypertension: Secondary | ICD-10-CM | POA: Insufficient documentation

## 2016-01-17 DIAGNOSIS — Z79899 Other long term (current) drug therapy: Secondary | ICD-10-CM | POA: Insufficient documentation

## 2016-01-17 DIAGNOSIS — R112 Nausea with vomiting, unspecified: Secondary | ICD-10-CM

## 2016-01-17 LAB — COMPREHENSIVE METABOLIC PANEL WITH GFR
ALT: 11 U/L — ABNORMAL LOW (ref 14–54)
AST: 16 U/L (ref 15–41)
Albumin: 3.7 g/dL (ref 3.5–5.0)
Alkaline Phosphatase: 47 U/L (ref 38–126)
Anion gap: 7 (ref 5–15)
BUN: 9 mg/dL (ref 6–20)
CO2: 24 mmol/L (ref 22–32)
Calcium: 9.2 mg/dL (ref 8.9–10.3)
Chloride: 109 mmol/L (ref 101–111)
Creatinine, Ser: 0.73 mg/dL (ref 0.44–1.00)
GFR calc Af Amer: >60 mL/min
GFR calc non Af Amer: >60 mL/min
Glucose, Bld: 100 mg/dL — ABNORMAL HIGH (ref 65–99)
Potassium: 3.5 mmol/L (ref 3.5–5.1)
Sodium: 140 mmol/L (ref 135–145)
Total Bilirubin: 0.2 mg/dL — ABNORMAL LOW (ref 0.3–1.2)
Total Protein: 6.3 g/dL — ABNORMAL LOW (ref 6.5–8.1)

## 2016-01-17 LAB — URINE MICROSCOPIC-ADD ON

## 2016-01-17 LAB — CBC WITH DIFFERENTIAL/PLATELET
Basophils Absolute: 0 K/uL (ref 0.0–0.1)
Basophils Relative: 0 %
Eosinophils Absolute: 0.2 K/uL (ref 0.0–0.7)
Eosinophils Relative: 4 %
HCT: 35 % — ABNORMAL LOW (ref 36.0–46.0)
Hemoglobin: 11.3 g/dL — ABNORMAL LOW (ref 12.0–15.0)
Lymphocytes Relative: 39 %
Lymphs Abs: 1.7 K/uL (ref 0.7–4.0)
MCH: 27.3 pg (ref 26.0–34.0)
MCHC: 32.3 g/dL (ref 30.0–36.0)
MCV: 84.5 fL (ref 78.0–100.0)
Monocytes Absolute: 0.3 K/uL (ref 0.1–1.0)
Monocytes Relative: 6 %
Neutro Abs: 2.2 K/uL (ref 1.7–7.7)
Neutrophils Relative %: 51 %
Platelets: 338 K/uL (ref 150–400)
RBC: 4.14 MIL/uL (ref 3.87–5.11)
RDW: 13.6 % (ref 11.5–15.5)
WBC: 4.3 K/uL (ref 4.0–10.5)

## 2016-01-17 LAB — URINALYSIS, ROUTINE W REFLEX MICROSCOPIC
Bilirubin Urine: NEGATIVE
Glucose, UA: NEGATIVE mg/dL
Ketones, ur: NEGATIVE mg/dL
Nitrite: NEGATIVE
Protein, ur: NEGATIVE mg/dL
Specific Gravity, Urine: 1.02 (ref 1.005–1.030)
pH: 6 (ref 5.0–8.0)

## 2016-01-17 LAB — LIPASE, BLOOD: Lipase: 28 U/L (ref 11–51)

## 2016-01-17 LAB — I-STAT BETA HCG BLOOD, ED (MC, WL, AP ONLY): I-stat hCG, quantitative: <5 m[IU]/mL

## 2016-01-17 MED ORDER — ONDANSETRON 4 MG PO TBDP
ORAL_TABLET | ORAL | Status: AC
  Administered 2016-01-17: 07:00:00
  Filled 2016-01-17: qty 2

## 2016-01-17 MED ORDER — ONDANSETRON HCL 4 MG/2ML IJ SOLN: 4.0000 mg | Freq: Once | INTRAMUSCULAR | Status: DC

## 2016-01-17 MED ORDER — ONDANSETRON 4 MG PO TBDP: 4.0000 mg | ORAL_TABLET | Freq: Three times a day (TID) | ORAL | 0 refills | Status: DC | PRN

## 2016-01-17 MED ORDER — TRAMADOL HCL 50 MG PO TABS
50.0000 mg | ORAL_TABLET | Freq: Once | ORAL | Status: AC
  Administered 2016-01-17: 50 mg via ORAL
  Filled 2016-01-17: qty 1

## 2016-01-17 MED ORDER — KETOROLAC TROMETHAMINE 30 MG/ML IJ SOLN
30.0000 mg | Freq: Once | INTRAMUSCULAR | Status: AC
  Administered 2016-01-17: 30 mg via INTRAVENOUS
  Filled 2016-01-17: qty 1

## 2016-01-17 MED ORDER — SODIUM CHLORIDE 0.9 % IV BOLUS (SEPSIS)
1000.0000 mL | Freq: Once | INTRAVENOUS | Status: AC
  Administered 2016-01-17: 1000 mL via INTRAVENOUS

## 2016-01-17 MED ORDER — ONDANSETRON HCL 4 MG/2ML IJ SOLN
4.0000 mg | Freq: Once | INTRAMUSCULAR | Status: AC
  Administered 2016-01-17: 4 mg via INTRAVENOUS
  Filled 2016-01-17: qty 2

## 2016-01-17 NOTE — ED Provider Notes (Signed)
[facility]-EMERGENCY DEPT Provider Note   CSN: [id number] Arrival date & time: 01/17/16  0611     History   Chief Complaint Chief Complaint  Patient presents with  . Nausea  . Dizziness    HPI Jamie Allison is a 40 y.o. female with a pmhx of HTN, sickle cell trait who presents to the ED today c/o nausea and lightheadedness. Pt states that last night she was in her otherwise normal state of health. Today when she woke up this morning she Felt nauseous and lightheaded. Pt states that she went to work as normal but her symptoms continued to get worse while at work. Pt then developed diffuse abdominal cramping so her boss encouraged her to go to the ED for further eval. While in the ED pt had 1 episode of NBNB emesis. Pt started her menstrual cycle 2 days ago. She had 2 BMs today that were normal in color and caliber. She denies and fever, chills, CP or SOB, syncope.   HPI  Past Medical History:  Diagnosis Date  . CHLAMYDIAL INFECTION 11/29/2009  . DEPRESSION 10/20/2008  . 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 Headache(784.0)   . Herpes   . HYPERTENSION 10/20/2008  . LUMBAR STRAIN, ACUTE 08/17/2009  . Polyuria 02/15/2010  . Sickle cell anemia (HCC)    trait    Patient Active Problem List   Diagnosis Date Noted  . Obesity (BMI 30-39.9) 04/16/2013  . POLYURIA 02/15/2010  . CHLAMYDIAL INFECTION 11/29/2009  . 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 UTI 11/29/2009  . LUMBAR STRAIN, ACUTE 08/17/2009  . HEADACHE 04/18/2009  . SKIN RASH 11/23/2008  . DEPRESSION 10/20/2008  . Essential hypertension 10/20/2008    Past Surgical History:  Procedure Laterality Date  . CESAREAN SECTION     X 3  . TONSILLECTOMY AND ADENOIDECTOMY    . TUBAL LIGATION      OB History    No data available       Home Medications    Prior to Admission medications   Medication Sig Start Date End Date Taking? Authorizing Provider  amLODipine (NORVASC) 10 MG tablet Take 1 tablet (10 mg total) by mouth daily. 08/31/15    Kristian Covey, MD  cyclobenzaprine (FLEXERIL) 10 MG tablet Take 1 tablet (10 mg total) by mouth at bedtime. 08/16/15   Kristian Covey, MD  multivitamin-iron-minerals-folic acid (CENTRUM) chewable tablet Chew 1 tablet by mouth daily. 09/04/15   Kristian Covey, MD  nystatin (MYCOSTATIN/NYSTOP) 100000 UNIT/GM POWD Apply 1 Bottle topically 2 (two) times daily. 09/02/15   Kristian Covey, MD    Family History Family History  Problem Relation Age of Onset  . Arthritis Other   . Hypertension Other   . Depression Other     Social History Social History  Substance Use Topics  . Smoking status: Never Smoker  . Smokeless tobacco: Never Used  . Alcohol use No     Allergies   Review of patient's allergies indicates no known allergies.   Review of Systems Review of Systems  All other systems reviewed and are negative.    Physical Exam Updated Vital Signs BP 133/69   Pulse (!) 50   Temp 98.7 F (37.1 C) (Oral)   Resp 18   Ht 5\' 4"  (1.626 m)   Wt 91.6 kg   SpO2 95%   BMI 34.67 kg/m   Physical Exam  Constitutional: She is oriented to person, place, and time. She appears well-developed and well-nourished. No distress.  HENT:  Head: Normocephalic and atraumatic.  Mouth/Throat: No oropharyngeal exudate.  Eyes: Conjunctivae and EOM are normal. Pupils are equal, round, and reactive to light. Right eye exhibits no discharge. Left eye exhibits no discharge. No scleral icterus.  Cardiovascular: Normal rate, regular rhythm, normal heart sounds and intact distal pulses.  Exam reveals no gallop and no friction rub.   No murmur heard. Pulmonary/Chest: Effort normal and breath sounds normal. No respiratory distress. She has no wheezes. She has no rales. She exhibits no tenderness.  Abdominal: Soft. Bowel sounds are normal. She exhibits no distension. There is tenderness (diffuse). There is no guarding.  No rigidity. No peritoneal signs.  Musculoskeletal: Normal range of motion.  She exhibits no edema.  Neurological: She is alert and oriented to person, place, and time.  Skin: Skin is warm and dry. No rash noted. She is not diaphoretic. No erythema. No pallor.  Psychiatric: She has a normal mood and affect. Her behavior is normal.  Nursing note and vitals reviewed.    ED Treatments / Results  Labs (all labs ordered are listed, but only abnormal results are displayed) Labs Reviewed  COMPREHENSIVE METABOLIC PANEL - Abnormal; Notable for the following:       Result Value   Glucose, Bld 100 (*)    Total Protein 6.3 (*)    ALT 11 (*)    Total Bilirubin 0.2 (*)    All other components within normal limits  CBC WITH DIFFERENTIAL/PLATELET - Abnormal; Notable for the following:    Hemoglobin 11.3 (*)    HCT 35.0 (*)    All other components within normal limits  LIPASE, BLOOD  URINALYSIS, ROUTINE W REFLEX MICROSCOPIC (NOT AT [facility])  I-STAT BETA HCG BLOOD, ED ([facility], [facility], [facility] ONLY)    EKG  EKG Interpretation None       Radiology No results found.  Procedures Procedures (including critical care time)  Medications Ordered in ED Medications  ondansetron (ZOFRAN-ODT) 4 MG disintegrating tablet (  Given 01/17/16 0648)  sodium chloride 0.9 % bolus 1,000 mL (0 mLs Intravenous Stopped 01/17/16 0815)  ondansetron (ZOFRAN) injection 4 mg (4 mg Intravenous Given 01/17/16 0737)  ketorolac (TORADOL) 30 MG/ML injection 30 mg (30 mg Intravenous Given 01/17/16 0737)  traMADol (ULTRAM) tablet 50 mg (50 mg Oral Given 01/17/16 0925)     Initial Impression / Assessment and Plan / ED Course  I have reviewed the triage vital signs and the nursing notes.  Pertinent labs & imaging results that were available during my care of the patient were reviewed by me and considered in my medical decision making (see chart for details).  Clinical Course   Otherwise healthy 40 y.o F presents to the ED today c/o lightheadedness, nausea and abd cramping onset today. On presentation to the ED  pt appears well, non-toxic, non-septic appearing. Pt had 1 episode of vomiting today which occurred while in the ED. Abd is diffusely tender, but is soft and without rigidity. Afebrile. All VSS. All lab work wnl. Hgb in urine, pt is currently menstruating which is likely related to her current symptoms. Pt given fluids, toradol and zofran in the ED with significant symptomatic improvement. No orthostasis. Hgb stable. Feel that pt is safe for discharge with PCP follow up. Pt is hemodynamically stable and ready for d/c. Return precautions outlined in patient discharge instructions.     Final Clinical Impressions(s) / ED Diagnoses   Final diagnoses:  Non-intractable vomiting with nausea, vomiting of unspecified type    New Prescriptions  New Prescriptions   No medications on file     Dub Mikes, PA-C 01/23/16 2023    Geoffery Lyons, MD 01/24/16 [phone]

## 2016-01-17 NOTE — ED Triage Notes (Signed)
Pt arrives to A13 at this time. Pt reports that she began feeling lightheaded and experiencing nausea earlier this morning.  Pan level rated 10/10.   Chief Complaint  Patient presents with  . Nausea  . Dizziness   Past Medical History:  Diagnosis Date  . CHLAMYDIAL INFECTION 11/29/2009  . DEPRESSION 10/20/2008  . 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 Headache(784.0)   . Herpes   . HYPERTENSION 10/20/2008  . LUMBAR STRAIN, ACUTE 08/17/2009  . Polyuria 02/15/2010  . Sickle cell anemia (HCC)    trait

## 2016-01-17 NOTE — Discharge Instructions (Signed)
Take Zofran as needed for nausea. Encourage adequate hydration, drink plenty of fluids. He can take over-the-counter ibuprofen for pain. Follow up with her primary care doctor for symptoms not improved. Plenty of rest and eat a bland diet until your symptoms have resolved. Return to the ED if you experience severe worsening of her symptoms, increased abdominal pain, fevers, blood in vomit or stool.

## 2016-10-07 ENCOUNTER — Ambulatory Visit (INDEPENDENT_AMBULATORY_CARE_PROVIDER_SITE_OTHER): Payer: BC Managed Care – PPO | Admitting: Family Medicine

## 2016-10-07 ENCOUNTER — Encounter: Payer: Self-pay | Admitting: Family Medicine

## 2016-10-07 VITALS — BP 124/80 | HR 63 | Temp 98.5°F | Ht 64.25 in | Wt 212.2 lb

## 2016-10-07 DIAGNOSIS — Z0000 Encounter for general adult medical examination without abnormal findings: Secondary | ICD-10-CM

## 2016-10-07 MED ORDER — FLUCONAZOLE 100 MG PO TABS: 100.0000 mg | ORAL_TABLET | Freq: Every day | ORAL | 1 refills | Status: DC

## 2016-10-07 NOTE — Progress Notes (Signed)
Subjective:     Patient ID: Jamie Allison, female   DOB: September 14, 1975, 41 y.o.   MRN: [id number]  HPI Patient here for physical exam. She has history of hypertension and actually ran out of her blood pressure medication about a week ago. She has scaled back red meat and is eating more vegetables. Has never smoked. No regular alcohol use. She had normal pap last year but does have a new partner since last year. She is requesting repeat Pap smear though she is currently on her menstrual cycle today.  Past Medical History:  Diagnosis Date  . CHLAMYDIAL INFECTION 11/29/2009  . DEPRESSION 10/20/2008  . 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 Headache(784.0)   . Herpes   . HYPERTENSION 10/20/2008  . LUMBAR STRAIN, ACUTE 08/17/2009  . Polyuria 02/15/2010  . Sickle cell anemia (HCC)    trait   Past Surgical History:  Procedure Laterality Date  . CESAREAN SECTION     X 3  . TONSILLECTOMY AND ADENOIDECTOMY    . TUBAL LIGATION      reports that she has never smoked. She has never used smokeless tobacco. She reports that she does not drink alcohol or use drugs. family history includes Arthritis in her other; Depression in her other; Hypertension in her other. No Known Allergies   Review of Systems  Constitutional: Negative for activity change, appetite change, fatigue, fever and unexpected weight change.  HENT: Negative for ear pain, hearing loss, sore throat and trouble swallowing.   Eyes: Negative for visual disturbance.  Respiratory: Negative for cough and shortness of breath.   Cardiovascular: Negative for chest pain and palpitations.  Gastrointestinal: Negative for abdominal pain, blood in stool, constipation and diarrhea.  Genitourinary: Negative for dysuria and hematuria.  Musculoskeletal: Negative for arthralgias, back pain and myalgias.  Skin: Negative for rash.  Neurological: Negative for dizziness, syncope and headaches.  Hematological: Negative for adenopathy.  Psychiatric/Behavioral:  Negative for confusion and dysphoric mood.       Objective:   Physical Exam  Constitutional: She is oriented to person, place, and time. She appears well-developed and well-nourished.  HENT:  Head: Normocephalic and atraumatic.  Eyes: EOM are normal. Pupils are equal, round, and reactive to light.  Neck: Normal range of motion. Neck supple. No thyromegaly present.  Cardiovascular: Normal rate, regular rhythm and normal heart sounds.   No murmur heard. Pulmonary/Chest: Breath sounds normal. No respiratory distress. She has no wheezes. She has no rales.  Abdominal: Soft. Bowel sounds are normal. She exhibits no distension and no mass. There is no tenderness. There is no rebound and no guarding.  Genitourinary:  Genitourinary Comments: Breasts are symmetric without mass. We elected not to do pelvic exam and Pap smear today's since she is currently on her menstrual cycle  Musculoskeletal: Normal range of motion. She exhibits no edema.  Lymphadenopathy:    She has no cervical adenopathy.  Neurological: She is alert and oriented to person, place, and time. She displays normal reflexes. No cranial nerve deficit.  Skin: No rash noted.  Psychiatric: She has a normal mood and affect. Her behavior is normal. Judgment and thought content normal.       Assessment:     Physical exam. Immunizations up-to-date    Plan:     -Obtain screening labs -Patient will need to return for Pap smear as she is on menstrual cycle currently -Encouraged to lose some weight  Kristian CoveyBruce W  MD [facility]

## 2016-10-07 NOTE — Patient Instructions (Signed)
Return when you're not on your menses for pap smear. We will call you regarding labs today. Monitor blood pressure and be in touch if consistently greater than 140/90

## 2016-10-08 LAB — CBC WITH DIFFERENTIAL/PLATELET
BASOS ABS: 0.1 10*3/uL (ref 0.0–0.1)
BASOS PCT: 0.9 % (ref 0.0–3.0)
EOS ABS: 0.2 10*3/uL (ref 0.0–0.7)
Eosinophils Relative: 3.3 % (ref 0.0–5.0)
HEMATOCRIT: 33 % — AB (ref 36.0–46.0)
Hemoglobin: 10.9 g/dL — ABNORMAL LOW (ref 12.0–15.0)
LYMPHS ABS: 2.3 10*3/uL (ref 0.7–4.0)
LYMPHS PCT: 35.4 % (ref 12.0–46.0)
MCHC: 32.9 g/dL (ref 30.0–36.0)
MCV: 83.8 fl (ref 78.0–100.0)
MONO ABS: 0.5 10*3/uL (ref 0.1–1.0)
Monocytes Relative: 7.5 % (ref 3.0–12.0)
NEUTROS ABS: 3.4 10*3/uL (ref 1.4–7.7)
NEUTROS PCT: 52.9 % (ref 43.0–77.0)
PLATELETS: 393 10*3/uL (ref 150.0–400.0)
RBC: 3.94 Mil/uL (ref 3.87–5.11)
RDW: 14.1 % (ref 11.5–15.5)
WBC: 6.4 10*3/uL (ref 4.0–10.5)

## 2016-10-08 LAB — TSH: TSH: 1.07 u[IU]/mL (ref 0.35–4.50)

## 2016-10-08 LAB — HEPATIC FUNCTION PANEL
ALK PHOS: 49 U/L (ref 39–117)
ALT: 7 U/L (ref 0–35)
AST: 11 U/L (ref 0–37)
Albumin: 4.2 g/dL (ref 3.5–5.2)
BILIRUBIN DIRECT: 0.1 mg/dL (ref 0.0–0.3)
BILIRUBIN TOTAL: 0.4 mg/dL (ref 0.2–1.2)
TOTAL PROTEIN: 6.8 g/dL (ref 6.0–8.3)

## 2016-10-08 LAB — BASIC METABOLIC PANEL
BUN: 12 mg/dL (ref 6–23)
CALCIUM: 9.6 mg/dL (ref 8.4–10.5)
CHLORIDE: 106 meq/L (ref 96–112)
CO2: 28 meq/L (ref 19–32)
CREATININE: 0.76 mg/dL (ref 0.40–1.20)
GFR: 108.69 mL/min (ref >60.00)
Glucose, Bld: 82 mg/dL (ref 70–99)
Potassium: 4.1 mEq/L (ref 3.5–5.1)
Sodium: 143 mEq/L (ref 135–145)

## 2016-10-08 LAB — LIPID PANEL
CHOL/HDL RATIO: 4
CHOLESTEROL: 170 mg/dL (ref 0–200)
HDL: 46.1 mg/dL (ref >39.00)
LDL Cholesterol: 112 mg/dL — ABNORMAL HIGH (ref 0–99)
NonHDL: 123.4
TRIGLYCERIDES: 57 mg/dL (ref 0.0–149.0)
VLDL: 11.4 mg/dL (ref 0.0–40.0)

## 2016-10-28 ENCOUNTER — Other Ambulatory Visit: Payer: Self-pay | Admitting: Family Medicine

## 2016-10-28 MED ORDER — AMLODIPINE BESYLATE 10 MG PO TABS: 10.0000 mg | ORAL_TABLET | Freq: Every day | ORAL | 11 refills | Status: DC

## 2017-01-06 ENCOUNTER — Encounter (HOSPITAL_COMMUNITY): Payer: Self-pay | Admitting: *Deleted

## 2017-01-06 ENCOUNTER — Emergency Department (HOSPITAL_COMMUNITY)
Admission: EM | Admit: 2017-01-06 | Discharge: 2017-01-06 | Disposition: A | Discharge status: Home / Self Care | Payer: BC Managed Care – PPO | Attending: Physician Assistant | Admitting: Physician Assistant

## 2017-01-06 DIAGNOSIS — M542 Cervicalgia: Secondary | ICD-10-CM

## 2017-01-06 DIAGNOSIS — Z79899 Other long term (current) drug therapy: Secondary | ICD-10-CM | POA: Diagnosis not present

## 2017-01-06 DIAGNOSIS — I10 Essential (primary) hypertension: Secondary | ICD-10-CM | POA: Insufficient documentation

## 2017-01-06 DIAGNOSIS — M62838 Other muscle spasm: Secondary | ICD-10-CM

## 2017-01-06 MED ORDER — CYCLOBENZAPRINE HCL 10 MG PO TABS: 10.0000 mg | ORAL_TABLET | Freq: Two times a day (BID) | ORAL | 0 refills | Status: DC | PRN

## 2017-01-06 MED ORDER — IBUPROFEN 600 MG PO TABS: 600.0000 mg | ORAL_TABLET | Freq: Four times a day (QID) | ORAL | 0 refills | Status: DC | PRN

## 2017-01-06 MED ORDER — IBUPROFEN 200 MG PO TABS
600.0000 mg | ORAL_TABLET | Freq: Once | ORAL | Status: AC
  Administered 2017-01-06: 600 mg via ORAL
  Filled 2017-01-06: qty 1

## 2017-01-06 NOTE — ED Notes (Signed)
Heat pack given to pt with instructionms to discard after heat disipates.

## 2017-01-06 NOTE — ED Provider Notes (Signed)
[facility] DEPT Provider Note   CSN: [id number] Arrival date & time: 01/06/17  3716  By signing my name below, I, Jamie Allison, attest that this documentation has been prepared under the direction and in the presence of [facility], PA-C. Electronically Signed: Diona Allison, ED Scribe. 01/06/17. 11:32 AM.  History   Chief Complaint Chief Complaint  Patient presents with  . Shoulder Pain    HPI Jamie Allison is a 41 y.o. female who presents to the Emergency Department complaining of achy, intermittently sharp, neck pain with radiation to the bl shoulders intermittent for the last year, worsening Saturday morning, 01/04/17. Pt reports it woke her up from her sleep. Associated sx include intermittent tingling into her finger tips, and occipital HA. Deep breathes, coughing, laughing and Flexion and extension of the neck exacerbates her pain. Has not tried anything for her symptoms. She normally sleeps on her side, but has been sleeping on her back because it alleviates her discomfort. Pt is a housekeeper, but denies any known trauma, falls, or heavy lifting that may have caused her sx. No recent injuries. Pt denies fever, chills, SOB, CP or any other complaints at this time.   The history is provided by the patient. No language interpreter was used.    Past Medical History:  Diagnosis Date  . CHLAMYDIAL INFECTION 11/29/2009  . DEPRESSION 10/20/2008  . 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 Headache(784.0)   . Herpes   . HYPERTENSION 10/20/2008  . LUMBAR STRAIN, ACUTE 08/17/2009  . Polyuria 02/15/2010  . Sickle cell anemia (HCC)    trait    Patient Active Problem List   Diagnosis Date Noted  . Obesity (BMI 30-39.9) 04/16/2013  . POLYURIA 02/15/2010  . CHLAMYDIAL INFECTION 11/29/2009  . 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 UTI 11/29/2009  . LUMBAR STRAIN, ACUTE 08/17/2009  . HEADACHE 04/18/2009  . SKIN RASH 11/23/2008  . DEPRESSION 10/20/2008  . Essential hypertension 10/20/2008     Past Surgical History:  Procedure Laterality Date  . CESAREAN SECTION     X 3  . TONSILLECTOMY AND ADENOIDECTOMY    . TUBAL LIGATION      OB History    No data available       Home Medications    Prior to Admission medications   Medication Sig Start Date End Date Taking? Authorizing Provider  amLODipine (NORVASC) 10 MG tablet Take 1 tablet (10 mg total) by mouth daily. 10/28/16   Burchette, Elberta Fortis, MD  Biotin 96789 MCG TABS Take by mouth.    [provider]  cyclobenzaprine (FLEXERIL) 10 MG tablet Take 1 tablet (10 mg total) by mouth 2 (two) times daily as needed for muscle spasms. 01/06/17   Luevenia Maxin,  A, PA-C  fluconazole (DIFLUCAN) 100 MG tablet Take 1 tablet (100 mg total) by mouth daily. 10/07/16   Burchette, Elberta Fortis, MD  ibuprofen (ADVIL,MOTRIN) 600 MG tablet Take 1 tablet (600 mg total) by mouth every 6 (six) hours as needed. 01/06/17   Michela Pitcher A, PA-C  multivitamin-iron-minerals-folic acid (CENTRUM) chewable tablet Chew 1 tablet by mouth daily. 09/04/15   Burchette, Elberta Fortis, MD    Family History Family History  Problem Relation Age of Onset  . Arthritis Other   . Hypertension Other   . Depression Other     Social History Social History  Substance Use Topics  . Smoking status: Never Smoker  . Smokeless tobacco: Never Used  . Alcohol use No     Allergies   Patient  has no known allergies.   Review of Systems Review of Systems  Constitutional: Negative for chills and fever.  Respiratory: Negative for shortness of breath.   Cardiovascular: Negative for chest pain.  Musculoskeletal: Positive for arthralgias and neck pain. Negative for neck stiffness.  Neurological: Positive for headaches.     Physical Exam Updated Vital Signs BP (!) 179/97 (BP Location: Left Arm)   Pulse (!) 51   Temp 98 F (36.7 C) (Oral)   Resp 16   Ht 5\' 4"  (1.626 m)   Wt 94.8 kg (209 lb)   LMP 12/30/2016 (Approximate)   SpO2 100%   BMI 35.87 kg/m   Physical  Exam  Constitutional: She appears well-developed and well-nourished. No distress.  HENT:  Head: Normocephalic and atraumatic.  Right Ear: External ear normal.  Left Ear: External ear normal.  Mouth/Throat: Oropharynx is clear and moist.  No tenderness to palpation of the skull, no deformity or crepitus noted  Eyes: Pupils are equal, round, and reactive to light. Conjunctivae are normal. Right eye exhibits no discharge. Left eye exhibits no discharge.  Neck: Normal range of motion. Neck supple. No JVD present. No tracheal deviation present.  Normal range of motion of the neck with pain exacerbated on flexion, extension, and lateral rotation. No focal midline cervical spine TTP, no deformity, crepitus, or step-off. Diffuse paraspinal and trapezius muscle tenderness and spasm appreciated.   Cardiovascular: Normal rate, regular rhythm, normal heart sounds and intact distal pulses.   Pulmonary/Chest: Effort normal and breath sounds normal. No respiratory distress. She has no wheezes. She has no rales. She exhibits no tenderness.  Abdominal: She exhibits no distension.  Musculoskeletal: Normal range of motion. She exhibits tenderness. She exhibits no edema.  No midline thoracic or lumbar spine TTP, no deformity, crepitus, or step-off noted. Cervical spine  evaluation as noted in the neck section. 5/5 strength of BUE major muscle groups. Good grip strength. Normal range of motion of the bilateral shoulders, however neck and shoulder pain exacerbated with upward motion and internal rotation of the shoulders. Negative anticancer sign, negative Neer's, negative Hawkins bilaterally   Neurological: She is alert. No sensory deficit.  Fluent speech, no facial droop, sensation intact to soft touch of bilateral upper extremities  Skin: Skin is warm and dry. No erythema.  Psychiatric: She has a normal mood and affect. Her behavior is normal.  Nursing note and vitals reviewed.    ED Treatments / Results   DIAGNOSTIC STUDIES: Oxygen Saturation is 100% on RA, normal by my interpretation.   COORDINATION OF CARE: 11:32 AM-Discussed next steps with pt which includes taking antiinflammatories on a schedule and a muscle relaxer. She is to use heat to help relax her muscles and stretch. Pt is to follow up with a PCP. Pt verbalized understanding and is agreeable with the plan.   Labs (all labs ordered are listed, but only abnormal results are displayed) Labs Reviewed - No data to display  EKG  EKG Interpretation None       Radiology No results found.  Procedures Procedures (including critical care time)  Medications Ordered in ED Medications  ibuprofen (ADVIL,MOTRIN) tablet 600 mg (not administered)     Initial Impression / Assessment and Plan / ED Course  I have reviewed the triage vital signs and the nursing notes.  Pertinent labs & imaging results that were available during my care of the patient were reviewed by me and considered in my medical decision making (see chart for details).  Patient with neck pain radiating to the shoulders which is reproducible on palpation of the paraspinal and trapezius musculature. Pain appears to be musculoskeletal in nature. No fever or meningeal signs to suggest meningitis. No chest pain or shortness of breath. No focal neurological deficits and no focal midline spine pain. Radiographs are not indicated at this time.RICE therapy indicated and discussed and patient is stable for discharge home with ibuprofen and Flexeril. Her blood pressure is somewhat elevated, and I do recommend follow-up with primary care physician for reevaluation of this and her current symptoms. She states she has been off of her blood pressure medication as her primary care provider told her to stop it. She may require referral to physical therapy. Discussed indications for return to the ED. Pt verbalized understanding of and agreement with plan and is safe for discharge  home at this time.  Final Clinical Impressions(s) / ED Diagnoses   Final diagnoses:  Muscle spasms of neck  Cervicalgia    New Prescriptions New Prescriptions   CYCLOBENZAPRINE (FLEXERIL) 10 MG TABLET    Take 1 tablet (10 mg total) by mouth 2 (two) times daily as needed for muscle spasms.   IBUPROFEN (ADVIL,MOTRIN) 600 MG TABLET    Take 1 tablet (600 mg total) by mouth every 6 (six) hours as needed.   I personally performed the services described in this documentation, which was scribed in my presence. The recorded information has been reviewed and is accurate.     Jeanie Sewer, PA-C 01/06/17 1246    Abelino Derrick, MD 01/08/17 1806

## 2017-01-06 NOTE — Discharge Instructions (Signed)
Alternate 600 mg of ibuprofen and [phone] mg of Tylenol every 3 hours as needed for pain. Do not exceed 4000 mg of Tylenol daily. Apply ice or heat to the affected areas for comfort. You may take Flexeril for muscle spasm, but do not drive, drink alcohol, or operate heavy machinery if this medication makes you drowsy. I typically recommend people take this medication at night only. Take some hot showers or hot baths and do some gentle stretching of the affected areas during this time. I have also attached neck exercises that you may find helpful. Follow up with primary care physician for reevaluation and possible referral to physical therapy. Also follow-up with primary care physician for reevaluation of your high blood pressure as you may need medication adjustments. Return to the ED immediately if any concerning signs or symptoms develop such as weakness, passing out, fevers, or worsening symptoms.

## 2017-01-06 NOTE — ED Notes (Signed)
Pt sttets she understands instrcuctions. Home stable with steady gait.

## 2017-01-06 NOTE — ED Triage Notes (Signed)
Pt c/o bil shoulder & neck pain x 1 year, worsening this Saturday, denies new injury, ambulatory, MAE, A&O x4

## 2017-03-17 ENCOUNTER — Encounter: Payer: Self-pay | Admitting: Family Medicine

## 2017-03-17 ENCOUNTER — Ambulatory Visit (INDEPENDENT_AMBULATORY_CARE_PROVIDER_SITE_OTHER): Payer: BC Managed Care – PPO | Admitting: Family Medicine

## 2017-03-17 VITALS — Temp 99.0°F | Ht 64.0 in | Wt 213.0 lb

## 2017-03-17 DIAGNOSIS — K529 Noninfective gastroenteritis and colitis, unspecified: Secondary | ICD-10-CM

## 2017-03-17 MED ORDER — ONDANSETRON 8 MG PO TBDP: 8.0000 mg | ORAL_TABLET | Freq: Three times a day (TID) | ORAL | 0 refills | Status: DC | PRN

## 2017-03-17 NOTE — Progress Notes (Signed)
   Subjective:    Patient ID: Jamie Allison, female    DOB: May 06, 1976, 41 y.o.   MRN: [id number]  HPI Here for 4 days of nausea, vomiting, and diarrhea. No fever. Not much abdominal pain. She is not able to keep anything down today.    Review of Systems  Constitutional: Negative.   Respiratory: Negative.   Cardiovascular: Negative.   Gastrointestinal: Positive for diarrhea, nausea and vomiting. Negative for abdominal distention, abdominal pain, anal bleeding, blood in stool and constipation.       Objective:   Physical Exam  Constitutional: She appears well-developed and well-nourished. No distress.  Cardiovascular: Normal rate, regular rhythm, normal heart sounds and intact distal pulses.   Pulmonary/Chest: Effort normal and breath sounds normal.  Abdominal: Soft. Bowel sounds are normal. She exhibits no distension and no mass. There is no tenderness. There is no rebound and no guarding.          Assessment & Plan:  Enteritis, use Zofran for nausea and Imodium for diarrhea. Drink water and Gatorade. Written out of work today and tomorrow.  Gershon CraneStephen , MD

## 2017-03-17 NOTE — Patient Instructions (Signed)
WE NOW OFFER   [facility] FAST TRACK!!!  SAME DAY Appointments for ACUTE CARE  Such as: Sprains, Injuries, cuts, abrasions, rashes, muscle pain, joint pain, back pain Colds, flu, sore throats, headache, allergies, cough, fever  Ear pain, sinus and eye infections Abdominal pain, nausea, vomiting, diarrhea, upset stomach Animal/insect bites  3 Easy Ways to Schedule: Walk-In Scheduling Call in scheduling Mychart Sign-up: [email or web address]         

## 2017-04-11 ENCOUNTER — Emergency Department (HOSPITAL_COMMUNITY)
Admission: EM | Admit: 2017-04-11 | Discharge: 2017-04-12 | Disposition: A | Discharge status: Home / Self Care | Payer: BC Managed Care – PPO | Attending: Emergency Medicine | Admitting: Emergency Medicine

## 2017-04-11 ENCOUNTER — Encounter (HOSPITAL_COMMUNITY): Payer: Self-pay | Admitting: Emergency Medicine

## 2017-04-11 ENCOUNTER — Other Ambulatory Visit: Payer: Self-pay

## 2017-04-11 DIAGNOSIS — R197 Diarrhea, unspecified: Secondary | ICD-10-CM | POA: Insufficient documentation

## 2017-04-11 DIAGNOSIS — R112 Nausea with vomiting, unspecified: Secondary | ICD-10-CM | POA: Diagnosis not present

## 2017-04-11 DIAGNOSIS — R109 Unspecified abdominal pain: Secondary | ICD-10-CM | POA: Diagnosis present

## 2017-04-11 DIAGNOSIS — D573 Sickle-cell trait: Secondary | ICD-10-CM | POA: Diagnosis not present

## 2017-04-11 DIAGNOSIS — R1084 Generalized abdominal pain: Secondary | ICD-10-CM

## 2017-04-11 DIAGNOSIS — F329 Major depressive disorder, single episode, unspecified: Secondary | ICD-10-CM | POA: Diagnosis not present

## 2017-04-11 DIAGNOSIS — Z79899 Other long term (current) drug therapy: Secondary | ICD-10-CM | POA: Insufficient documentation

## 2017-04-11 DIAGNOSIS — I10 Essential (primary) hypertension: Secondary | ICD-10-CM | POA: Insufficient documentation

## 2017-04-11 LAB — COMPREHENSIVE METABOLIC PANEL
ALT: 13 U/L — ABNORMAL LOW (ref 14–54)
ANION GAP: 13 (ref 5–15)
AST: 20 U/L (ref 15–41)
Albumin: 4.5 g/dL (ref 3.5–5.0)
Alkaline Phosphatase: 65 U/L (ref 38–126)
BUN: 14 mg/dL (ref 6–20)
CO2: 20 mmol/L — AB (ref 22–32)
Calcium: 10.2 mg/dL (ref 8.9–10.3)
Chloride: 106 mmol/L (ref 101–111)
Creatinine, Ser: 0.87 mg/dL (ref 0.44–1.00)
GFR calc non Af Amer: >60 mL/min (ref >60)
Glucose, Bld: 152 mg/dL — ABNORMAL HIGH (ref 65–99)
POTASSIUM: 4 mmol/L (ref 3.5–5.1)
SODIUM: 139 mmol/L (ref 135–145)
TOTAL PROTEIN: 8.2 g/dL — AB (ref 6.5–8.1)
Total Bilirubin: 0.6 mg/dL (ref 0.3–1.2)

## 2017-04-11 LAB — URINALYSIS, ROUTINE W REFLEX MICROSCOPIC
BILIRUBIN URINE: NEGATIVE
GLUCOSE, UA: NEGATIVE mg/dL
KETONES UR: 20 mg/dL — AB
Leukocytes, UA: NEGATIVE
NITRITE: NEGATIVE
PH: 5 (ref 5.0–8.0)
PROTEIN: 100 mg/dL — AB
Specific Gravity, Urine: 1.017 (ref 1.005–1.030)

## 2017-04-11 LAB — CBC
HEMATOCRIT: 39.6 % (ref 36.0–46.0)
HEMOGLOBIN: 13.1 g/dL (ref 12.0–15.0)
MCH: 27 pg (ref 26.0–34.0)
MCHC: 33.1 g/dL (ref 30.0–36.0)
MCV: 81.5 fL (ref 78.0–100.0)
Platelets: 446 10*3/uL — ABNORMAL HIGH (ref 150–400)
RBC: 4.86 MIL/uL (ref 3.87–5.11)
RDW: 14.4 % (ref 11.5–15.5)
WBC: 9.5 10*3/uL (ref 4.0–10.5)

## 2017-04-11 LAB — LIPASE, BLOOD: Lipase: 23 U/L (ref 11–51)

## 2017-04-11 LAB — HCG, QUANTITATIVE, PREGNANCY: hCG, Beta Chain, Quant, S: <1 m[IU]/mL (ref <5)

## 2017-04-11 MED ORDER — DIPHENOXYLATE-ATROPINE 2.5-0.025 MG PO TABS
2.0000 | ORAL_TABLET | Freq: Once | ORAL | Status: AC
  Administered 2017-04-11: 2 via ORAL
  Filled 2017-04-11: qty 2

## 2017-04-11 MED ORDER — ONDANSETRON HCL 4 MG/2ML IJ SOLN
4.0000 mg | Freq: Once | INTRAMUSCULAR | Status: AC
  Administered 2017-04-11: 4 mg via INTRAVENOUS
  Filled 2017-04-11: qty 2

## 2017-04-11 MED ORDER — SODIUM CHLORIDE 0.9 % IV BOLUS (SEPSIS)
1000.0000 mL | Freq: Once | INTRAVENOUS | Status: AC
  Administered 2017-04-11: 1000 mL via INTRAVENOUS

## 2017-04-11 NOTE — ED Triage Notes (Signed)
Pt reports gen abd pain N/V/D since noon. Describes pain as sharp, 10/10.

## 2017-04-12 MED ORDER — ONDANSETRON HCL 4 MG PO TABS: 4.0000 mg | ORAL_TABLET | Freq: Four times a day (QID) | ORAL | 0 refills | Status: DC

## 2017-04-12 MED ORDER — DIPHENOXYLATE-ATROPINE 2.5-0.025 MG PO TABS: 2.0000 | ORAL_TABLET | Freq: Four times a day (QID) | ORAL | 0 refills | Status: DC | PRN

## 2017-04-12 NOTE — ED Provider Notes (Signed)
[facility]   CSN: [id number] Arrival date & time: 04/11/17  1950     History   Chief Complaint Chief Complaint  Patient presents with  . Abdominal Pain    HPI Jamie Allison is a 41 y.o. female.  Patient presents to the emergency department for evaluation of abdominal pain.  Patient reports that symptoms began around noon today.  She has been experiencing nausea, vomiting, diarrhea associated with diffuse abdominal cramping.  She has not had a fever.  No upper respiratory infection symptoms.  She is unaware of any sick contacts.      Past Medical History:  Diagnosis Date  . CHLAMYDIAL INFECTION 11/29/2009  . DEPRESSION 10/20/2008  . 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 Headache(784.0)   . Herpes   . HYPERTENSION 10/20/2008  . LUMBAR STRAIN, ACUTE 08/17/2009  . Polyuria 02/15/2010  . Sickle cell anemia (HCC)    trait    Patient Active Problem List   Diagnosis Date Noted  . Obesity (BMI 30-39.9) 04/16/2013  . POLYURIA 02/15/2010  . CHLAMYDIAL INFECTION 11/29/2009  . 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 UTI 11/29/2009  . LUMBAR STRAIN, ACUTE 08/17/2009  . HEADACHE 04/18/2009  . SKIN RASH 11/23/2008  . DEPRESSION 10/20/2008  . Essential hypertension 10/20/2008    Past Surgical History:  Procedure Laterality Date  . CESAREAN SECTION     X 3  . TONSILLECTOMY AND ADENOIDECTOMY    . TUBAL LIGATION      OB History    No data available       Home Medications    Prior to Admission medications   Medication Sig Start Date End Date Taking? Authorizing Provider  Biotin [id number] MCG TABS Take by mouth.   Yes [provider]  ibuprofen (ADVIL,MOTRIN) 600 MG tablet Take 1 tablet (600 mg total) by mouth every 6 (six) hours as needed. 01/06/17  Yes Fawze, Mina A, PA-C  amLODipine (NORVASC) 10 MG tablet Take 1 tablet (10 mg total) by mouth daily. Patient not taking: Reported on 03/17/2017 10/28/16   Kristian CoveyBurchette, Bruce W, MD   cyclobenzaprine (FLEXERIL) 10 MG tablet Take 1 tablet (10 mg total) by mouth 2 (two) times daily as needed for muscle spasms. Patient not taking: Reported on 03/17/2017 01/06/17   Michela PitcherFawze, Mina A, PA-C  diphenoxylate-atropine (LOMOTIL) 2.5-0.025 MG tablet Take 2 tablets by mouth 4 (four) times daily as needed for diarrhea or loose stools. 04/12/17   Gilda CreasePollina, Christopher J, MD  fluconazole (DIFLUCAN) 100 MG tablet Take 1 tablet (100 mg total) by mouth daily. Patient not taking: Reported on 03/17/2017 10/07/16   Kristian CoveyBurchette, Bruce W, MD  multivitamin-iron-minerals-folic acid (CENTRUM) chewable tablet Chew 1 tablet by mouth daily. Patient not taking: Reported on 03/17/2017 09/04/15   Kristian CoveyBurchette, Bruce W, MD  ondansetron (ZOFRAN) 4 MG tablet Take 1 tablet (4 mg total) by mouth every 6 (six) hours. 04/12/17   Gilda CreasePollina, Christopher J, MD  ondansetron (ZOFRAN-ODT) 8 MG disintegrating tablet Take 1 tablet (8 mg total) by mouth every 8 (eight) hours as needed for nausea or vomiting. Patient not taking: Reported on 04/11/2017 03/17/17   Nelwyn SalisburyFry, Stephen A, MD    Family History Family History  Problem Relation Age of Onset  . Arthritis Other   . Hypertension Other   . Depression Other     Social History Social History   Tobacco Use  . Smoking status: Never Smoker  . Smokeless tobacco: Never Used  Substance Use Topics  .  Alcohol use: No  . Drug use: No     Allergies   Patient has no known allergies.   Review of Systems Review of Systems  Gastrointestinal: Positive for abdominal pain, diarrhea, nausea and vomiting.  All other systems reviewed and are negative.    Physical Exam Updated Vital Signs BP 140/67   Pulse (!) 55   Temp 98 F (36.7 C) (Oral)   Resp 17   Ht 5\' 4"  (1.626 m)   Wt 97.5 kg (215 lb)   LMP 03/21/2017 (Approximate)   SpO2 100%   BMI 36.90 kg/m   Physical Exam  Constitutional: She is oriented to person, place, and time. She appears well-developed and well-nourished.  No distress.  HENT:  Head: Normocephalic and atraumatic.  Right Ear: Hearing normal.  Left Ear: Hearing normal.  Nose: Nose normal.  Mouth/Throat: Oropharynx is clear and moist and mucous membranes are normal.  Eyes: Conjunctivae and EOM are normal. Pupils are equal, round, and reactive to light.  Neck: Normal range of motion. Neck supple.  Cardiovascular: Regular rhythm, S1 normal and S2 normal. Exam reveals no gallop and no friction rub.  No murmur heard. Pulmonary/Chest: Effort normal and breath sounds normal. No respiratory distress. She exhibits no tenderness.  Abdominal: Soft. Normal appearance and bowel sounds are normal. There is no hepatosplenomegaly. There is no tenderness. There is no rebound, no guarding, no tenderness at McBurney's point and negative Murphy's sign. No hernia.  Musculoskeletal: Normal range of motion.  Neurological: She is alert and oriented to person, place, and time. She has normal strength. No cranial nerve deficit or sensory deficit. Coordination normal. GCS eye subscore is 4. GCS verbal subscore is 5. GCS motor subscore is 6.  Skin: Skin is warm, dry and intact. No rash noted. No cyanosis.  Psychiatric: She has a normal mood and affect. Her speech is normal and behavior is normal. Thought content normal.  Nursing note and vitals reviewed.    ED Treatments / Results  Labs (all labs ordered are listed, but only abnormal results are displayed) Labs Reviewed  COMPREHENSIVE METABOLIC PANEL - Abnormal; Notable for the following components:      Result Value   CO2 20 (*)    Glucose, Bld 152 (*)    Total Protein 8.2 (*)    ALT 13 (*)    All other components within normal limits  CBC - Abnormal; Notable for the following components:   Platelets 446 (*)    All other components within normal limits  URINALYSIS, ROUTINE W REFLEX MICROSCOPIC - Abnormal; Notable for the following components:   APPearance HAZY (*)    Hgb urine dipstick MODERATE (*)    Ketones,  ur 20 (*)    Protein, ur 100 (*)    Bacteria, UA RARE (*)    Squamous Epithelial / LPF 0-5 (*)    All other components within normal limits  LIPASE, BLOOD  HCG, QUANTITATIVE, PREGNANCY    EKG  EKG Interpretation None       Radiology No results found.  Procedures Procedures (including critical care time)  Medications Ordered in ED Medications  sodium chloride 0.9 % bolus 1,000 mL (0 mLs Intravenous Stopped 04/12/17 0100)  ondansetron (ZOFRAN) injection 4 mg (4 mg Intravenous Given 04/11/17 2357)  diphenoxylate-atropine (LOMOTIL) 2.5-0.025 MG per tablet 2 tablet (2 tablets Oral Given 04/11/17 2345)     Initial Impression / Assessment and Plan / ED Course  I have reviewed the triage vital signs and the nursing  notes.  Pertinent labs & imaging results that were available during my care of the patient were reviewed by me and considered in my medical decision making (see chart for details).     Patient presents to the ER for evaluation of abdominal cramping in association with nausea, vomiting, diarrhea.  Abdominal exam is benign and essentially nontender.  No focality or signs of peritonitis.  Symptoms are more consistent with infectious etiology, likely viral gastroenteritis.  Lab work unremarkable.  Patient hydrated and administered medications, now tolerating oral intake without difficulty.  Patient appropriate for continued outpatient symptomatic management.  Return if her symptoms worsen.  Final Clinical Impressions(s) / ED Diagnoses   Final diagnoses:  Generalized abdominal pain  Non-intractable vomiting with nausea, unspecified vomiting type  Diarrhea of presumed infectious origin    ED Discharge Orders        Ordered    ondansetron (ZOFRAN) 4 MG tablet  Every 6 hours     04/12/17 0146    diphenoxylate-atropine (LOMOTIL) 2.5-0.025 MG tablet  4 times daily PRN     04/12/17 0146       Gilda Crease, MD 04/12/17 0147

## 2017-06-10 ENCOUNTER — Ambulatory Visit: Payer: BC Managed Care – PPO | Admitting: Family Medicine

## 2017-06-10 VITALS — BP 150/70 | HR 71 | Temp 98.8°F | Ht 64.0 in | Wt 219.8 lb

## 2017-06-10 DIAGNOSIS — J3489 Other specified disorders of nose and nasal sinuses: Secondary | ICD-10-CM | POA: Diagnosis not present

## 2017-06-10 MED ORDER — AMOXICILLIN-POT CLAVULANATE 875-125 MG PO TABS: 1.0000 | ORAL_TABLET | Freq: Two times a day (BID) | ORAL | 0 refills | Status: DC

## 2017-06-10 NOTE — Progress Notes (Signed)
Subjective:     Patient ID: Jamie Allison, female   DOB: 1975/09/10, 42 y.o.   MRN: [id number]  HPI Acute visit with headache and nasal congestion and left-sided facial pain mostly maxillary sinus region. Onset of symptoms just yesterday. Progressive since then. No generalized body aches. Rare cough. No fever. No bloody discharge. No upper teeth pain. Has not taken any medications thus far. Pain progressed through work today  Past Medical History:  Diagnosis Date  . CHLAMYDIAL INFECTION 11/29/2009  . DEPRESSION 10/20/2008  . 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 Headache(784.0)   . Herpes   . HYPERTENSION 10/20/2008  . LUMBAR STRAIN, ACUTE 08/17/2009  . Polyuria 02/15/2010  . Sickle cell anemia (HCC)    trait   Past Surgical History:  Procedure Laterality Date  . CESAREAN SECTION     X 3  . TONSILLECTOMY AND ADENOIDECTOMY    . TUBAL LIGATION      reports that  has never smoked. she has never used smokeless tobacco. She reports that she does not drink alcohol or use drugs. family history includes Arthritis in her other; Depression in her other; Hypertension in her other. No Known Allergies   Review of Systems  Constitutional: Positive for fatigue. Negative for fever.  HENT: Positive for congestion, sinus pressure and sinus pain.   Respiratory: Positive for cough.   Neurological: Positive for headaches.       Objective:   Physical Exam  Constitutional: She appears well-developed and well-nourished.  HENT:  Right Ear: External ear normal.  Left Ear: External ear normal.  Mouth/Throat: Oropharynx is clear and moist. No oropharyngeal exudate.  Neck: Neck supple.  Cardiovascular: Normal rate and regular rhythm.  Pulmonary/Chest: Effort normal and breath sounds normal. No respiratory distress. She has no wheezes. She has no rales.  Lymphadenopathy:    She has no cervical adenopathy.       Assessment:     Left facial pain. Even though her symptoms are relatively acute they're  very localized (over left maxillary sinus) to one side which suggests most likely sinus source of pain    Plan:     -Recommend good hydration -Consider over-the-counter plain Mucinex -Advil or Aleve as needed for headache and facial pain -Consider nasal irrigation with Nettie pot and saline -Printed prescription for Augmentin to start if she has any progressive symptoms are not improving in several days with the above -Work note written for today and tomorrow  Kristian CoveyBruce W  MD [facility]

## 2017-06-10 NOTE — Patient Instructions (Addendum)
Try some advil or Aleve for facial pain and headache Stay well hydrated Consider moist heat to face over sinuses Consider OTC plain Mucinex Consider Netti pot

## 2017-06-11 ENCOUNTER — Telehealth: Payer: Self-pay | Admitting: Family Medicine

## 2017-06-11 NOTE — Telephone Encounter (Signed)
Copied from CRM [phone]. Topic: General - Other >> Jun 11, 2017 12:26 PM Percival SpanishKennedy, Cheryl W wrote:  Pt saw the doctor yesterday and she was told she could go back to work on Thursday but she call today to say she is not feeling better and will need another note would like a call back [phone]

## 2017-06-11 NOTE — Telephone Encounter (Signed)
Ok to extend work note one more day.

## 2017-06-11 NOTE — Telephone Encounter (Signed)
Letter ready for pick up

## 2017-09-09 ENCOUNTER — Encounter: Payer: Self-pay | Admitting: Family Medicine

## 2017-09-09 ENCOUNTER — Other Ambulatory Visit: Payer: Self-pay

## 2017-09-09 ENCOUNTER — Ambulatory Visit: Payer: BC Managed Care – PPO | Admitting: Family Medicine

## 2017-09-09 VITALS — BP 140/82 | HR 62 | Temp 98.8°F | Resp 15 | Ht 64.0 in | Wt 213.0 lb

## 2017-09-09 DIAGNOSIS — G44209 Tension-type headache, unspecified, not intractable: Secondary | ICD-10-CM

## 2017-09-09 DIAGNOSIS — R110 Nausea: Secondary | ICD-10-CM | POA: Diagnosis not present

## 2017-09-09 MED ORDER — ONDANSETRON 8 MG PO TBDP: 8.0000 mg | ORAL_TABLET | Freq: Three times a day (TID) | ORAL | 0 refills | Status: DC | PRN

## 2017-09-09 MED ORDER — FLUCONAZOLE 100 MG PO TABS: 100.0000 mg | ORAL_TABLET | Freq: Every day | ORAL | 1 refills | Status: DC

## 2017-09-09 MED ORDER — CYCLOBENZAPRINE HCL 10 MG PO TABS: 10.0000 mg | ORAL_TABLET | Freq: Two times a day (BID) | ORAL | 0 refills | Status: DC | PRN

## 2017-09-09 NOTE — Progress Notes (Signed)
Subjective:     Patient ID: Shelbie HutchingShewanda L Barritt, female   DOB: [birth year], 42 y.o.   MRN: [id number]  HPI Patient seen with chief complaint of one-day history of nausea. No vomiting. She has actually been able to drink fluids and eat some today. Denies any fever, chills, dysuria, or abdominal pain. No stool changes. Currently not taking any regular medications. She has had some headache which is more of a tension type in her neck and upper back. This has responded to Flexeril in the past and she is requesting refill.  She is sexually active but this had previous bilateral tubal ligation. Menses have been normal.  Past Medical History:  Diagnosis Date  . CHLAMYDIAL INFECTION 11/29/2009  . DEPRESSION 10/20/2008  . 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 Headache(784.0)   . Herpes   . HYPERTENSION 10/20/2008  . LUMBAR STRAIN, ACUTE 08/17/2009  . Polyuria 02/15/2010  . Sickle cell anemia (HCC)    trait   Past Surgical History:  Procedure Laterality Date  . CESAREAN SECTION     X 3  . TONSILLECTOMY AND ADENOIDECTOMY    . TUBAL LIGATION      reports that she has never smoked. She has never used smokeless tobacco. She reports that she does not drink alcohol or use drugs. family history includes Arthritis in her other; Depression in her other; Hypertension in her other. No Known Allergies   Review of Systems  Constitutional: Negative for chills and fever.  Respiratory: Negative for shortness of breath.   Cardiovascular: Negative for chest pain.  Gastrointestinal: Positive for nausea. Negative for abdominal distention, abdominal pain, blood in stool, constipation, diarrhea and vomiting.  Genitourinary: Negative for dysuria.       Objective:   Physical Exam  Constitutional: She appears well-developed and well-nourished.  HENT:  Mouth/Throat: Oropharynx is clear and moist.  Cardiovascular: Normal rate and regular rhythm.  Pulmonary/Chest: Effort normal and breath sounds normal. No respiratory  distress. She has no wheezes. She has no rales.  Abdominal: Soft. Bowel sounds are normal. She exhibits no distension. There is no tenderness. There is no rebound and no guarding.       Assessment:     Nausea without vomiting. Etiology unclear. She does not have any abdominal pain, fever, or other red flags.    Plan:     -Refilled Zofran 8 mg ODT every 8 hours as needed for nausea -Follow-up for any persistent or worsening symptoms such as vomiting or new symptoms such as abdominal pain -Refilled Flexeril to use 1 daily at bedtime as needed for muscle tension type headache -Work note was written for today and tomorrow  Kristian CoveyBruce W  MD [facility]

## 2017-09-09 NOTE — Patient Instructions (Signed)
Nausea, Adult  Nausea is the feeling of an upset stomach or having to vomit. Nausea on its own is not usually a serious concern, but it may be an early sign of a more serious medical problem. As nausea gets worse, it can lead to vomiting. If vomiting develops, or if you are not able to drink enough fluids, you are at risk of becoming dehydrated. Dehydration can make you tired and thirsty, cause you to have a dry mouth, and decrease how often you urinate. Older adults and people with other diseases or a weak immune system are at higher risk for dehydration. The main goals of treating your nausea are:   To limit repeated nausea episodes.   To prevent vomiting and dehydration.    Follow these instructions at home:  Follow instructions from your health care provider about how to care for yourself at home.  Eating and drinking  Follow these recommendations as told by your health care provider:   Take an oral rehydration solution (ORS). This is a drink that is sold at pharmacies and retail stores.   Drink clear fluids in small amounts as you are able. Clear fluids include water, ice chips, diluted fruit juice, and low-calorie sports drinks.   Eat bland, easy-to-digest foods in small amounts as you are able. These foods include bananas, applesauce, rice, lean meats, toast, and crackers.   Avoid drinking fluids that contain a lot of sugar or caffeine, such as energy drinks, sports drinks, and soda.   Avoid alcohol.   Avoid spicy or fatty foods.    General instructions   Drink enough fluid to keep your urine clear or pale yellow.   Wash your hands often. If soap and water are not available, use hand sanitizer.   Make sure that all people in your household wash their hands well and often.   Rest at home while you recover.   Take over-the-counter and prescription medicines only as told by your health care provider.   Breathe slowly and deeply when you feel nauseous.   Watch your condition for any  changes.   Keep all follow-up visits as told by your health care provider. This is important.  Contact a health care provider if:   You have a headache.   You have new symptoms.   Your nausea gets worse.   You have a fever.   You feel light-headed or dizzy.   You vomit.   You cannot keep fluids down.  Get help right away if:   You have pain in your chest, neck, arm, or jaw.   You feel extremely weak or you faint.   You have vomit that is bright red or looks like coffee grounds.   You have bloody or black stools or stools that look like tar.   You have a severe headache, a stiff neck, or both.   You have severe pain, cramping, or bloating in your abdomen.   You have a rash.   You have difficulty breathing or are breathing very quickly.   Your heart is beating very quickly.   Your skin feels cold and clammy.   You feel confused.   You have pain when you urinate.   You have signs of dehydration, such as:  ? Dark urine, very little, or no urine.  ? Cracked lips.  ? Dry mouth.  ? Sunken eyes.  ? Sleepiness.  ? Weakness.  These symptoms may represent a serious problem that is an emergency. Do not wait   to see if the symptoms will go away. Get medical help right away. Call your local emergency services (911 in the [place] Do not drive yourself to the hospital.  This information is not intended to replace advice given to you by your health care provider. Make sure you discuss any questions you have with your health care provider.  Document Released: 06/13/2004 Document Revised: 10/09/2015 Document Reviewed: 01/10/2015  [employer]vier Interactive Patient Education  2018 [employer]

## 2017-09-23 ENCOUNTER — Emergency Department (HOSPITAL_COMMUNITY)
Admission: EM | Admit: 2017-09-23 | Discharge: 2017-09-23 | Disposition: A | Discharge status: Home / Self Care | Payer: BC Managed Care – PPO | Attending: Emergency Medicine | Admitting: Emergency Medicine

## 2017-09-23 ENCOUNTER — Encounter (HOSPITAL_COMMUNITY): Payer: Self-pay | Admitting: *Deleted

## 2017-09-23 ENCOUNTER — Emergency Department (HOSPITAL_COMMUNITY): Payer: BC Managed Care – PPO

## 2017-09-23 ENCOUNTER — Other Ambulatory Visit: Payer: Self-pay

## 2017-09-23 DIAGNOSIS — M25561 Pain in right knee: Secondary | ICD-10-CM | POA: Diagnosis not present

## 2017-09-23 DIAGNOSIS — I10 Essential (primary) hypertension: Secondary | ICD-10-CM | POA: Diagnosis not present

## 2017-09-23 DIAGNOSIS — Z79899 Other long term (current) drug therapy: Secondary | ICD-10-CM | POA: Diagnosis not present

## 2017-09-23 MED ORDER — MELOXICAM 15 MG PO TABS: 15.0000 mg | ORAL_TABLET | Freq: Every day | ORAL | 0 refills | Status: DC

## 2017-09-23 MED ORDER — IBUPROFEN 400 MG PO TABS
600.0000 mg | ORAL_TABLET | Freq: Once | ORAL | Status: AC
  Administered 2017-09-23: 600 mg via ORAL
  Filled 2017-09-23: qty 1

## 2017-09-23 NOTE — ED Notes (Signed)
Ortho tech paged  

## 2017-09-23 NOTE — ED Provider Notes (Signed)
[facility] EMERGENCY DEPARTMENT Provider Note   CSN: [id number] Arrival date & time: 09/23/17  0450     History   Chief Complaint Chief Complaint  Patient presents with  . Knee Pain    HPI Jamie Allison is a 42 y.o. female with a history of HTN who presents to the ED today for right knee pain.  Patient states that she was getting out of her bed yesterday and twisted when she felt a pulling sensation on theaPatient states that she was getting out of her bed yesterday and twisted when she felt a pulling sensation on the lateral aspect of her right knee  right knee.  She notes since that time whenever she ambulates she has sharp pains on the lateral aspect of the knee. She has not been taking anything for her symptoms. She denies any fever, history of gout, overlying redness/heat, difficulty with rom, numbness/tingling/weakness.   HPI  Past Medical History:  Diagnosis Date  . CHLAMYDIAL INFECTION 11/29/2009  . DEPRESSION 10/20/2008  . 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 Headache(784.0)   . Herpes   . HYPERTENSION 10/20/2008  . LUMBAR STRAIN, ACUTE 08/17/2009  . Polyuria 02/15/2010  . Sickle cell anemia (HCC)    trait    Patient Active Problem List   Diagnosis Date Noted  . Obesity (BMI 30-39.9) 04/16/2013  . POLYURIA 02/15/2010  . CHLAMYDIAL INFECTION 11/29/2009  . 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 UTI 11/29/2009  . LUMBAR STRAIN, ACUTE 08/17/2009  . HEADACHE 04/18/2009  . SKIN RASH 11/23/2008  . DEPRESSION 10/20/2008  . Essential hypertension 10/20/2008    Past Surgical History:  Procedure Laterality Date  . CESAREAN SECTION     X 3  . TONSILLECTOMY AND ADENOIDECTOMY    . TUBAL LIGATION       OB History   None      Home Medications    Prior to Admission medications   Medication Sig Start Date End Date Taking? Authorizing Provider  amLODipine (NORVASC) 10 MG tablet Take 1 tablet (10 mg total) by mouth daily. Patient not taking: Reported on  03/17/2017 10/28/16   Kristian Covey, MD  Biotin 40981 MCG TABS Take by mouth.    [provider]  cyclobenzaprine (FLEXERIL) 10 MG tablet Take 1 tablet (10 mg total) by mouth 2 (two) times daily as needed for muscle spasms. 09/09/17   Burchette, Elberta Fortis, MD  fluconazole (DIFLUCAN) 100 MG tablet Take 1 tablet (100 mg total) by mouth daily. 09/09/17   Burchette, Elberta Fortis, MD  ibuprofen (ADVIL,MOTRIN) 600 MG tablet Take 1 tablet (600 mg total) by mouth every 6 (six) hours as needed. Patient not taking: Reported on 06/10/2017 01/06/17   Michela Pitcher A, PA-C  multivitamin-iron-minerals-folic acid (CENTRUM) chewable tablet Chew 1 tablet by mouth daily. 09/04/15   Burchette, Elberta Fortis, MD  ondansetron (ZOFRAN-ODT) 8 MG disintegrating tablet Take 1 tablet (8 mg total) by mouth every 8 (eight) hours as needed for nausea or vomiting. 09/09/17   Burchette, Elberta Fortis, MD    Family History Family History  Problem Relation Age of Onset  . Arthritis Other   . Hypertension Other   . Depression Other     Social History Social History   Tobacco Use  . Smoking status: Never Smoker  . Smokeless tobacco: Never Used  Substance Use Topics  . Alcohol use: Yes  . Drug use: No     Allergies   Patient has no known allergies.   Review  of Systems Review of Systems  All other systems reviewed and are negative.    Physical Exam Updated Vital Signs BP (!) 153/94 (BP Location: Right Arm)   Pulse 60   Temp 98 F (36.7 C) (Oral)   Resp 17   Ht  (1.626 m)   Wt 92.1 kg (203 lb)   SpO2 100%   BMI 34.84 kg/m   Physical Exam  Constitutional: She appears well-developed and well-nourished.  HENT:  Head: Normocephalic and atraumatic.  Right Ear: External ear normal.  Left Ear: External ear normal.  Eyes: Conjunctivae are normal. Right eye exhibits no discharge. Left eye exhibits no discharge. No scleral icterus.  Cardiovascular:  Pulses:      Dorsalis pedis pulses are 2+ on the right side,  and 2+ on the left side.       Posterior tibial pulses are 2+ on the right side, and 2+ on the left side.  Calves are symmetric in size bilaterally.  No lower extremity edema  Pulmonary/Chest: Effort normal. No respiratory distress.  Musculoskeletal:  Appearance. Small effusion with ballotment test. No obvious deformity. No skin erythema, heat, fluctuance or break of the skin. TTP over lateral joint line and LCL. Active and passive flexion and extension intact. Negative Lachman's test. Negative anterior/poster drawer bilaterally. No varus or valgus laxity or locking. No TTP of hips or ankles. Compartments soft. Neurovascularly intact distally to site of injury.  Neurological: She is alert.  Skin: No pallor.  Psychiatric: She has a normal mood and affect.  Nursing note and vitals reviewed.    ED Treatments / Results  Labs (all labs ordered are listed, but only abnormal results are displayed) Labs Reviewed - No data to display  EKG None  Radiology Dg Knee Complete 4 Views Right  Result Date: 09/23/2017 CLINICAL DATA:  42 y/o  F; right knee pain with onset yesterday. EXAM: RIGHT KNEE - COMPLETE 4+ VIEW COMPARISON:  None. FINDINGS: No evidence of fracture, dislocation, or joint effusion. No evidence of arthropathy or other focal bone abnormality. Soft tissues are unremarkable. IMPRESSION: Negative. Electronically Signed   By: Mitzi Hansen M.D.   On: 09/23/2017 06:24    Procedures Procedures (including critical care time) SPLINT APPLICATION Date/Time: 9:37 AM Authorized by: Jacinto Halim Consent: Verbal consent obtained. Risks and benefits: risks, benefits and alternatives were discussed Consent given by: patient Splint applied by: orthopedic technician Location details: right knee Splint type: knee immobilizer Supplies used: knee immobilizer  Post-procedure: The splinted body part was neurovascularly unchanged following the procedure. Patient tolerance: Patient  tolerated the procedure well with no immediate complications.  Medications Ordered in ED Medications  ibuprofen (ADVIL,MOTRIN) tablet 600 mg (has no administration in time range)     Initial Impression / Assessment and Plan / ED Course  I have reviewed the triage vital signs and the nursing notes.  Pertinent labs & imaging results that were available during my care of the patient were reviewed by me and considered in my medical decision making (see chart for details).     42 y.o. female with right knee pain. Pt with mild swelling to the joint spaces, with normal rom. Pt is without systemic symptoms, fever, erythema or redness of the joint consistent with gout or septic joint.  Patient X-Ray negative for obvious fracture or dislocation. Pain managed in ED. Pt advised to follow up with orthopedics if symptoms persist for further evaluation and treatment. Patient given knee immobilizer and crutches while in ED. 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 Conservative therapy recommended and discussed. Will give Mobic (no history of GI bleed, ulcers, and last cr on lab review wnl). Return precautions discussed. Patient will be dc home & is agreeable with above plan.  Final Clinical Impressions(s) / ED Diagnoses   Final diagnoses:  Acute pain of right knee    ED Discharge Orders        Ordered    meloxicam (MOBIC) 15 MG tablet  Daily     09/23/17 0935       Jacinto Halim, PA-C 09/23/17 1610    Loren Racer, MD 09/25/17 [phone]

## 2017-09-23 NOTE — Progress Notes (Signed)
Orthopedic Tech Progress Note Patient Details:  Jamie Allison [birth year] [id number]  Ortho Devices Type of Ortho Device: Crutches, Knee Immobilizer Ortho Device/Splint Location: rle Ortho Device/Splint Interventions: Application   Post Interventions Patient Tolerated: Well Instructions Provided: Care of device   [name] 09/23/2017, 10:31 AM

## 2017-09-23 NOTE — ED Triage Notes (Signed)
C/o pain right knee onset yest. Denies injury.

## 2017-09-23 NOTE — Discharge Instructions (Signed)
Please read and follow all provided instructions.  You have been seen today for right knee pain  Tests performed today include: An x-ray of the affected area - does NOT show any broken bones or dislocations.  Vital signs. See below for your results today.   Home care instructions: -- *PRICE in the first 24-48 hours after injury: Protect (with brace, splint, sling), if given by your provider Rest Ice- Do not apply ice pack directly to your skin, place towel or similar between your skin and ice/ice pack. Apply ice for 20 min, then remove for 40 min while awake Compression- Wear brace, elastic bandage, splint as directed by your provider Elevate affected extremity above the level of your heart when not walking around for the first 24-48 hours   Take Mobic once daily with food as directed.   Follow-up instructions: Please follow-up with your primary care provider or the provided orthopedic physician (bone specialist) if you continue to have significant pain in 1 week. In this case you may have a more severe injury that requires further care.   Return instructions:  Please return if your toes or feet are numb or tingling, appear gray or blue, or you have severe pain (also elevate the leg and loosen splint or wrap if you were given one) If you develop fever, redness or warmth of the knee or inability to move the knee Please return to the Emergency Department if you experience worsening symptoms.  Please return if you have any other emergent concerns. Additional Information:  Your vital signs today were: BP (!) 153/94 (BP Location: Right Arm)    Pulse 60    Temp 98 F (36.7 C) (Oral)    Resp 17    Ht  (1.626 m)    Wt 92.1 kg (203 lb)    SpO2 100%    BMI 34.84 kg/m  If your blood pressure (BP) was elevated above 135/85 this visit, please have this repeated by your doctor within one month. ---------------

## 2017-09-30 ENCOUNTER — Ambulatory Visit (INDEPENDENT_AMBULATORY_CARE_PROVIDER_SITE_OTHER): Payer: BC Managed Care – PPO | Admitting: Orthopaedic Surgery

## 2017-09-30 ENCOUNTER — Encounter (INDEPENDENT_AMBULATORY_CARE_PROVIDER_SITE_OTHER): Payer: Self-pay | Admitting: Orthopaedic Surgery

## 2017-09-30 DIAGNOSIS — M25461 Effusion, right knee: Secondary | ICD-10-CM

## 2017-09-30 DIAGNOSIS — M25561 Pain in right knee: Secondary | ICD-10-CM | POA: Diagnosis not present

## 2017-09-30 MED ORDER — TRAMADOL HCL 50 MG PO TABS: 50.0000 mg | ORAL_TABLET | Freq: Four times a day (QID) | ORAL | 0 refills | Status: DC | PRN

## 2017-09-30 NOTE — Progress Notes (Signed)
Office Visit Note   Patient: Jamie Allison           Date of Birth: 11/11/1975           MRN: [id number] Visit Date: 09/30/2017              Requested by: Kristian Covey, MD [address] [place], [place] [address] PCP: Kristian Covey, MD   Assessment & Plan: Visit Diagnoses:  1. Acute pain of right knee   2. Effusion, right knee     Plan: Impression is right knee medial meniscus tear.  At this point, we will obtain an MRI of her right knee.  She will follow-up with [place] once this is completed.  I will give her a work note to be out until we see her back in follow-up.  I also given her prescription of tramadol.  Call with concerns or questions in the meantime.  Follow-Up Instructions: Return in about 2 weeks (around 10/14/2017) for MRI review.   Orders:  Orders Placed This Encounter  Procedures  . MR Knee Right w/o contrast   Meds ordered this encounter  Medications  . traMADol (ULTRAM) 50 MG tablet    Sig: Take 1 tablet (50 mg total) by mouth every 6 (six) hours as needed.    Dispense:  30 tablet    Refill:  0      Procedures: No procedures performed   Clinical Data: No additional findings.   Subjective: Chief Complaint  Patient presents with  . Right Knee - Pain, Edema    S/p twisting injury to right knee on 09/23/17    HPI patient is a pleasant 42 year old female who presents to our clinic today with a new injury to her right knee.  On 09/23/2017, she twisted her right knee in the bed causing excruciating pain to the medial aspect.  Since then she has had moderate pain which is not any better.  She describes it as a constant ache with sharp shooting pains at times to include when she is pivoting.  She does note locking as well.  She has increased pain with flexion and pivoting of the knee.  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  No previous injury to the right knee.She was seen at the ED where  x-rays were obtained and negative for fracture.   Review of Systems as detailed in HPI.  All others are negative.   Objective: Vital Signs: There were no vitals taken for this visit.  Physical Exam well-developed and well-nourished female no acute distress.  Alert and oriented x3.  Ortho Exam examination of the right knee reveals a 1+ effusion.  Range of motion 0 to 95 degrees.  Marked medial joint line tenderness.  Minimal patellofemoral crepitus.  She is stable to valgus and varus stress.  She is neurovascularly intact distally.  Specialty Comments:  No specialty comments available.  Imaging: No new imaging.    PMFS History: Patient Active Problem List   Diagnosis Date Noted  . Obesity (BMI 30-39.9) 04/16/2013  . POLYURIA 02/15/2010  . CHLAMYDIAL INFECTION 11/29/2009  . 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 UTI 11/29/2009  . LUMBAR STRAIN, ACUTE 08/17/2009  . HEADACHE 04/18/2009  . SKIN RASH 11/23/2008  . DEPRESSION 10/20/2008  . Essential hypertension 10/20/2008   Past Medical History:  Diagnosis Date  . CHLAMYDIAL INFECTION 11/29/2009  . DEPRESSION 10/20/2008  . 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  Headache(784.0)   . Herpes   . HYPERTENSION 10/20/2008  . LUMBAR STRAIN, ACUTE 08/17/2009  . Polyuria 02/15/2010  . Sickle cell anemia (HCC)    trait    Family History  Problem Relation Age of Onset  . Arthritis Other   . Hypertension Other   . Depression Other     Past Surgical History:  Procedure Laterality Date  . CESAREAN SECTION     X 3  . TONSILLECTOMY AND ADENOIDECTOMY    . TUBAL LIGATION     Social History   Occupational History  . Not on file  Tobacco Use  . Smoking status: Never Smoker  . Smokeless tobacco: Never Used  Substance and Sexual Activity  . Alcohol use: Yes  . Drug use: No  . Sexual activity: Not on file

## 2017-10-03 ENCOUNTER — Ambulatory Visit
Admission: RE | Admit: 2017-10-03 | Discharge: 2017-10-03 | Disposition: A | Discharge status: Home / Self Care | Payer: BC Managed Care – PPO | Source: Ambulatory Visit | Attending: Orthopaedic Surgery | Admitting: Orthopaedic Surgery

## 2017-10-03 DIAGNOSIS — M25561 Pain in right knee: Secondary | ICD-10-CM

## 2017-10-03 DIAGNOSIS — M25461 Effusion, right knee: Secondary | ICD-10-CM

## 2017-10-07 ENCOUNTER — Encounter (INDEPENDENT_AMBULATORY_CARE_PROVIDER_SITE_OTHER): Payer: Self-pay

## 2017-10-07 ENCOUNTER — Ambulatory Visit (INDEPENDENT_AMBULATORY_CARE_PROVIDER_SITE_OTHER): Payer: BC Managed Care – PPO | Admitting: Orthopaedic Surgery

## 2017-10-07 ENCOUNTER — Encounter (INDEPENDENT_AMBULATORY_CARE_PROVIDER_SITE_OTHER): Payer: Self-pay | Admitting: Orthopaedic Surgery

## 2017-10-07 DIAGNOSIS — M25561 Pain in right knee: Secondary | ICD-10-CM

## 2017-10-07 MED ORDER — METHYLPREDNISOLONE ACETATE 40 MG/ML IJ SUSP
40.0000 mg | INTRAMUSCULAR | Status: AC | PRN
  Administered 2017-10-07: 40 mg via INTRA_ARTICULAR

## 2017-10-07 MED ORDER — BUPIVACAINE HCL 0.25 % IJ SOLN
2.0000 mL | INTRAMUSCULAR | Status: AC | PRN
  Administered 2017-10-07: 2 mL via INTRA_ARTICULAR

## 2017-10-07 MED ORDER — LIDOCAINE HCL 1 % IJ SOLN
2.0000 mL | INTRAMUSCULAR | Status: AC | PRN
  Administered 2017-10-07: 2 mL

## 2017-10-07 NOTE — Progress Notes (Signed)
   Procedure Note  Patient: Jamie Allison             Date of Birth: 04/06/1976           MRN: [id number]             Visit Date: 10/07/2017  Procedures: Visit Diagnoses: Acute pain of right knee - Plan: Large Joint Inj: R knee  Large Joint Inj: R knee on 10/07/2017 10:53 AM Indications: pain Details: 22 G needle, anterolateral approach Medications: 2 mL lidocaine 1 %; 2 mL bupivacaine 0.25 %; 40 mg methylPREDNISolone acetate 40 MG/ML

## 2017-10-07 NOTE — Progress Notes (Signed)
Patient comes in today to discuss MRI results of her right knee.  Initial injury was approximately 2 weeks ago when she turned her leg in the bed.  Since then she has had marked pain medial aspect with the inability to flex her knee past 90 degrees.  This is really not improved over the past few weeks.  MRI results from 10/03/2017 are unremarkable for meniscal or ligamentous injury, and no arthritic change noted.  At this point, we will proceed with intra-articular cortisone injection.  She will follow-up with [place] as needed.  Call with concerns or questions in the meantime.

## 2017-11-12 ENCOUNTER — Ambulatory Visit: Payer: BC Managed Care – PPO | Admitting: Family Medicine

## 2017-11-12 ENCOUNTER — Encounter: Payer: Self-pay | Admitting: Family Medicine

## 2017-11-12 VITALS — BP 148/90 | HR 91 | Temp 99.0°F | Wt 207.7 lb

## 2017-11-12 DIAGNOSIS — R102 Pelvic and perineal pain: Secondary | ICD-10-CM

## 2017-11-12 LAB — POCT URINALYSIS DIPSTICK
BILIRUBIN UA: NEGATIVE
Glucose, UA: NEGATIVE
Ketones, UA: NEGATIVE
LEUKOCYTES UA: NEGATIVE
Nitrite, UA: NEGATIVE
Protein, UA: NEGATIVE
Spec Grav, UA: 1.015 (ref 1.010–1.025)
Urobilinogen, UA: 0.2 E.U./dL
pH, UA: 6.5 (ref 5.0–8.0)

## 2017-11-12 LAB — POCT URINE PREGNANCY: Preg Test, Ur: NEGATIVE

## 2017-11-12 NOTE — Progress Notes (Signed)
  Subjective:     Patient ID: Jamie Allison, female   DOB: 05-09-1976, 42 y.o.   MRN: [id number]  HPI Patient seen with onset this past Sunday which is 3 days ago of some suprapubic pain which is relatively mild and location central . She denies any burning with urination or frequency. She describes a pressure-like sensation intermittently. No fever. No radiation of pain. No exacerbating or alleviating factors. She had some mild nausea yesterday but no vomiting. No stool changes. She's had previous tubal ligation  Last menstrual period was 10/25/17- normal flow but slightly earlier than usual.. No vaginal discharge.  Past Medical History:  Diagnosis Date  . CHLAMYDIAL INFECTION 11/29/2009  . DEPRESSION 10/20/2008  . 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 Headache(784.0)   . Herpes   . HYPERTENSION 10/20/2008  . LUMBAR STRAIN, ACUTE 08/17/2009  . Polyuria 02/15/2010  . Sickle cell anemia (HCC)    trait   Past Surgical History:  Procedure Laterality Date  . CESAREAN SECTION     X 3  . TONSILLECTOMY AND ADENOIDECTOMY    . TUBAL LIGATION      reports that she has never smoked. She has never used smokeless tobacco. She reports that she drinks alcohol. She reports that she does not use drugs. family history includes Arthritis in her other; Depression in her other; Hypertension in her other. No Known Allergies   Review of Systems  Constitutional: Negative for chills and fever.  Respiratory: Negative for shortness of breath.   Cardiovascular: Negative for chest pain.  Gastrointestinal: Negative for abdominal distention, blood in stool, constipation, diarrhea and vomiting.  Genitourinary: Negative for difficulty urinating, dysuria, flank pain, hematuria and vaginal pain.       Objective:   Physical Exam  Constitutional: She appears well-developed and well-nourished.  Cardiovascular: Normal rate and regular rhythm.  Pulmonary/Chest: Effort normal and breath sounds normal.  Abdominal: Normal  appearance and bowel sounds are normal. She exhibits no distension, no ascites and no mass.  Minimal tenderness suprapubic area. No right or left lower quadrant tenderness       Assessment:     Suprapubic pain. Urine dipstick reveals small blood otherwise negative. Patient requesting pregnancy test though she's had previous tubal. This came back negative. Etiology unclear. She's not had any fever and relatively benign exam at this time    Plan:     -Urine studies were sent for culture along with screen for chlamydia and gonorrhea. -Follow-up promptly for any fever or worsening symptoms  Kristian CoveyBruce W  MD [facility]

## 2017-11-12 NOTE — Patient Instructions (Signed)
Follow up in AM for CBC  Watch for any fever, increased abdominal pain, or any new symptoms.

## 2017-11-13 ENCOUNTER — Other Ambulatory Visit (HOSPITAL_COMMUNITY)
Admission: RE | Admit: 2017-11-13 | Discharge: 2017-11-13 | Disposition: A | Discharge status: Home / Self Care | Payer: BC Managed Care – PPO | Source: Ambulatory Visit | Attending: Family Medicine | Admitting: Family Medicine

## 2017-11-13 DIAGNOSIS — R102 Pelvic and perineal pain: Secondary | ICD-10-CM | POA: Diagnosis present

## 2017-11-14 LAB — URINE CYTOLOGY ANCILLARY ONLY
Bacterial vaginitis: POSITIVE — AB
CANDIDA VAGINITIS: NEGATIVE
Chlamydia: NEGATIVE
Neisseria Gonorrhea: NEGATIVE
TRICH (WINDOWPATH): NEGATIVE

## 2017-11-14 LAB — URINE CULTURE
MICRO NUMBER: 90769416
SPECIMEN QUALITY:: ADEQUATE

## 2017-11-17 ENCOUNTER — Other Ambulatory Visit: Payer: Self-pay | Admitting: Family Medicine

## 2017-11-17 MED ORDER — METRONIDAZOLE 500 MG PO TABS: 500.0000 mg | ORAL_TABLET | Freq: Two times a day (BID) | ORAL | 0 refills | Status: DC

## 2018-01-02 ENCOUNTER — Ambulatory Visit (INDEPENDENT_AMBULATORY_CARE_PROVIDER_SITE_OTHER): Payer: BC Managed Care – PPO | Admitting: Family Medicine

## 2018-01-02 ENCOUNTER — Other Ambulatory Visit (HOSPITAL_COMMUNITY)
Admission: RE | Admit: 2018-01-02 | Discharge: 2018-01-02 | Disposition: A | Discharge status: Home / Self Care | Payer: BC Managed Care – PPO | Source: Ambulatory Visit | Attending: Family Medicine | Admitting: Family Medicine

## 2018-01-02 VITALS — BP 148/90 | HR 62 | Temp 98.3°F | Ht 64.0 in | Wt 222.2 lb

## 2018-01-02 DIAGNOSIS — Z0000 Encounter for general adult medical examination without abnormal findings: Secondary | ICD-10-CM

## 2018-01-02 LAB — BASIC METABOLIC PANEL
BUN: 14 mg/dL (ref 6–23)
CHLORIDE: 106 meq/L (ref 96–112)
CO2: 32 meq/L (ref 19–32)
Calcium: 9.9 mg/dL (ref 8.4–10.5)
Creatinine, Ser: 0.87 mg/dL (ref 0.40–1.20)
GFR: 92.41 mL/min (ref >60.00)
GLUCOSE: 90 mg/dL (ref 70–99)
POTASSIUM: 4.5 meq/L (ref 3.5–5.1)
SODIUM: 142 meq/L (ref 135–145)

## 2018-01-02 LAB — HEPATIC FUNCTION PANEL
ALBUMIN: 4.2 g/dL (ref 3.5–5.2)
ALK PHOS: 46 U/L (ref 39–117)
ALT: 7 U/L (ref 0–35)
AST: 10 U/L (ref 0–37)
BILIRUBIN DIRECT: 0.1 mg/dL (ref 0.0–0.3)
TOTAL PROTEIN: 7 g/dL (ref 6.0–8.3)
Total Bilirubin: 0.4 mg/dL (ref 0.2–1.2)

## 2018-01-02 LAB — LIPID PANEL
CHOL/HDL RATIO: 3
Cholesterol: 161 mg/dL (ref 0–200)
HDL: 50.9 mg/dL (ref >39.00)
LDL CALC: 95 mg/dL (ref 0–99)
NONHDL: 110.14
TRIGLYCERIDES: 78 mg/dL (ref 0.0–149.0)
VLDL: 15.6 mg/dL (ref 0.0–40.0)

## 2018-01-02 LAB — CBC WITH DIFFERENTIAL/PLATELET
BASOS ABS: 0 10*3/uL (ref 0.0–0.1)
Basophils Relative: 0.6 % (ref 0.0–3.0)
Eosinophils Absolute: 0.2 10*3/uL (ref 0.0–0.7)
Eosinophils Relative: 2.9 % (ref 0.0–5.0)
HCT: 34.1 % — ABNORMAL LOW (ref 36.0–46.0)
Hemoglobin: 11.3 g/dL — ABNORMAL LOW (ref 12.0–15.0)
LYMPHS ABS: 2.3 10*3/uL (ref 0.7–4.0)
Lymphocytes Relative: 38.3 % (ref 12.0–46.0)
MCHC: 33.1 g/dL (ref 30.0–36.0)
MCV: 81.6 fl (ref 78.0–100.0)
MONO ABS: 0.5 10*3/uL (ref 0.1–1.0)
MONOS PCT: 7.7 % (ref 3.0–12.0)
NEUTROS ABS: 3.1 10*3/uL (ref 1.4–7.7)
NEUTROS PCT: 50.5 % (ref 43.0–77.0)
PLATELETS: 393 10*3/uL (ref 150.0–400.0)
RBC: 4.18 Mil/uL (ref 3.87–5.11)
RDW: 15.4 % (ref 11.5–15.5)
WBC: 6.1 10*3/uL (ref 4.0–10.5)

## 2018-01-02 LAB — TSH: TSH: 1.03 u[IU]/mL (ref 0.35–4.50)

## 2018-01-02 MED ORDER — FLUCONAZOLE 100 MG PO TABS: 100.0000 mg | ORAL_TABLET | Freq: Every day | ORAL | 1 refills | Status: DC

## 2018-01-02 NOTE — Patient Instructions (Addendum)
Keep sodium intake < 2,000 mg daily  Get more consistent exercise  Lose some weight- this can help your BP get to goal  Monitor blood pressure and be in touch if consistently > 140/90.    Set up 15 minute follow up in one month.

## 2018-01-02 NOTE — Progress Notes (Signed)
Subjective:     Patient ID: Jamie Allison, female   DOB: 12/05/1975, 42 y.o.   MRN: [id number]  HPI Patient seen for physical exam. Recent bacterial vaginosis. Did improve with Flagyl but she's had some recurrent discharge since then. She's had history of recurrent intertrigo type rash in her groin which has responded to oral fluconazole in the past.  She continues to work at [employer] and [employer].  Never smoked.  No regular ETOH.   Will need tetanus next year. Due for repeat Pap smear now.  Past Medical History:  Diagnosis Date  . CHLAMYDIAL INFECTION 11/29/2009  . DEPRESSION 10/20/2008  . 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 Headache(784.0)   . Herpes   . HYPERTENSION 10/20/2008  . LUMBAR STRAIN, ACUTE 08/17/2009  . Polyuria 02/15/2010  . Sickle cell anemia (HCC)    trait   Past Surgical History:  Procedure Laterality Date  . CESAREAN SECTION     X 3  . TONSILLECTOMY AND ADENOIDECTOMY    . TUBAL LIGATION      reports that she has never smoked. She has never used smokeless tobacco. She reports that she drinks alcohol. She reports that she does not use drugs. family history includes Arthritis in her other; Depression in her other; Hypertension in her other. No Known Allergies   Review of Systems  Constitutional: Negative for activity change, appetite change, fatigue, fever and unexpected weight change.  HENT: Negative for ear pain, hearing loss, sore throat and trouble swallowing.   Eyes: Negative for visual disturbance.  Respiratory: Negative for cough and shortness of breath.   Cardiovascular: Negative for chest pain and palpitations.  Gastrointestinal: Negative for abdominal pain, blood in stool, constipation and diarrhea.  Genitourinary: Positive for vaginal discharge. Negative for dysuria, hematuria and pelvic pain.  Musculoskeletal: Negative for arthralgias, back pain and myalgias.  Skin: Positive for rash.  Neurological: Negative for dizziness, syncope and headaches.   Hematological: Negative for adenopathy.  Psychiatric/Behavioral: Negative for confusion and dysphoric mood.       Objective:   Physical Exam  Constitutional: She is oriented to person, place, and time. She appears well-developed and well-nourished.  HENT:  Head: Normocephalic and atraumatic.  Eyes: Pupils are equal, round, and reactive to light. EOM are normal.  Neck: Normal range of motion. Neck supple. No thyromegaly present.  Cardiovascular: Normal rate, regular rhythm and normal heart sounds.  No murmur heard. Pulmonary/Chest: Breath sounds normal. No respiratory distress. She has no wheezes. She has no rales.  Abdominal: Soft. Bowel sounds are normal. She exhibits no distension and no mass. There is no tenderness. There is no rebound and no guarding.  Genitourinary:  Genitourinary Comments: Normal external genitalia. She has some thick whitish mucus in the vaginal vault otherwise normal. Pap smear obtained. Bimanual exam no mass. Nontender.  Breasts are symmetric with no masses palpated. No nipple inversion. No skin dimpling.  Musculoskeletal: Normal range of motion. She exhibits no edema.  Lymphadenopathy:    She has no cervical adenopathy.  Neurological: She is alert and oriented to person, place, and time. She displays normal reflexes. No cranial nerve deficit.  Skin: No rash noted.  Psychiatric: She has a normal mood and affect. Her behavior is normal. Judgment and thought content normal.       Assessment:     Physical exam. Patient has elevated blood pressure today and has had similar elevations recently. We discussed several things as follows    Plan:     -Lose some  weight. -Keep sodium intake less than 2000 mg daily -Try to establish more consistent aerobic exercise -Bring back to reassess blood pressure one month. If not to goal at that point start medication -Obtain screening lab work -Pap smear obtained  Kristian CoveyBruce W Burchette MD [facility]

## 2018-01-05 ENCOUNTER — Other Ambulatory Visit: Payer: Self-pay | Admitting: *Deleted

## 2018-01-05 DIAGNOSIS — D649 Anemia, unspecified: Secondary | ICD-10-CM

## 2018-01-05 LAB — CYTOLOGY - PAP: Diagnosis: NEGATIVE

## 2018-01-05 LAB — HEPATITIS B SURFACE ANTIGEN: HEP B S AG: NONREACTIVE

## 2018-01-05 LAB — HIV ANTIBODY (ROUTINE TESTING W REFLEX): HIV: NONREACTIVE

## 2018-01-05 LAB — HEPATITIS B SURFACE ANTIBODY,QUALITATIVE: Hep B S Ab: NONREACTIVE

## 2018-01-05 LAB — RPR: RPR Ser Ql: NONREACTIVE

## 2018-02-16 ENCOUNTER — Other Ambulatory Visit: Payer: Self-pay

## 2018-02-20 ENCOUNTER — Other Ambulatory Visit (INDEPENDENT_AMBULATORY_CARE_PROVIDER_SITE_OTHER): Payer: BC Managed Care – PPO

## 2018-02-20 DIAGNOSIS — D649 Anemia, unspecified: Secondary | ICD-10-CM

## 2018-02-20 LAB — CBC WITH DIFFERENTIAL/PLATELET
BASOS ABS: 0 10*3/uL (ref 0.0–0.1)
Basophils Relative: 0.6 % (ref 0.0–3.0)
EOS PCT: 1.4 % (ref 0.0–5.0)
Eosinophils Absolute: 0.1 10*3/uL (ref 0.0–0.7)
HEMATOCRIT: 32.9 % — AB (ref 36.0–46.0)
Hemoglobin: 10.8 g/dL — ABNORMAL LOW (ref 12.0–15.0)
LYMPHS PCT: 36.8 % (ref 12.0–46.0)
Lymphs Abs: 2 10*3/uL (ref 0.7–4.0)
MCHC: 32.8 g/dL (ref 30.0–36.0)
MCV: 81.1 fl (ref 78.0–100.0)
MONOS PCT: 6.2 % (ref 3.0–12.0)
Monocytes Absolute: 0.3 10*3/uL (ref 0.1–1.0)
NEUTROS ABS: 3 10*3/uL (ref 1.4–7.7)
Neutrophils Relative %: 55 % (ref 43.0–77.0)
Platelets: 417 10*3/uL — ABNORMAL HIGH (ref 150.0–400.0)
RBC: 4.06 Mil/uL (ref 3.87–5.11)
RDW: 15.1 % (ref 11.5–15.5)
WBC: 5.4 10*3/uL (ref 4.0–10.5)

## 2018-03-30 ENCOUNTER — Ambulatory Visit: Payer: BC Managed Care – PPO | Admitting: Family Medicine

## 2018-03-30 ENCOUNTER — Other Ambulatory Visit: Payer: Self-pay

## 2018-03-30 ENCOUNTER — Encounter: Payer: Self-pay | Admitting: Family Medicine

## 2018-03-30 VITALS — BP 128/86 | HR 72 | Temp 98.5°F | Ht 64.0 in | Wt 219.9 lb

## 2018-03-30 DIAGNOSIS — D573 Sickle-cell trait: Secondary | ICD-10-CM | POA: Diagnosis not present

## 2018-03-30 DIAGNOSIS — D649 Anemia, unspecified: Secondary | ICD-10-CM | POA: Diagnosis not present

## 2018-03-30 LAB — CBC WITH DIFFERENTIAL/PLATELET
Basophils Absolute: 0 10*3/uL (ref 0.0–0.1)
Basophils Relative: 0.5 % (ref 0.0–3.0)
Eosinophils Absolute: 0.1 10*3/uL (ref 0.0–0.7)
Eosinophils Relative: 2 % (ref 0.0–5.0)
HEMATOCRIT: 33.5 % — AB (ref 36.0–46.0)
HEMOGLOBIN: 11.2 g/dL — AB (ref 12.0–15.0)
LYMPHS ABS: 2.6 10*3/uL (ref 0.7–4.0)
LYMPHS PCT: 37 % (ref 12.0–46.0)
MCHC: 33.4 g/dL (ref 30.0–36.0)
MCV: 81.1 fl (ref 78.0–100.0)
MONOS PCT: 7.5 % (ref 3.0–12.0)
Monocytes Absolute: 0.5 10*3/uL (ref 0.1–1.0)
Neutro Abs: 3.7 10*3/uL (ref 1.4–7.7)
Neutrophils Relative %: 53 % (ref 43.0–77.0)
Platelets: 389 10*3/uL (ref 150.0–400.0)
RBC: 4.14 Mil/uL (ref 3.87–5.11)
RDW: 15.1 % (ref 11.5–15.5)
WBC: 6.9 10*3/uL (ref 4.0–10.5)

## 2018-03-30 LAB — FERRITIN: Ferritin: 14.8 ng/mL (ref 10.0–291.0)

## 2018-03-30 NOTE — Patient Instructions (Signed)
Iron-Rich Diet Iron is a mineral that helps your body to produce hemoglobin. Hemoglobin is a protein in your red blood cells that carries oxygen to your body's tissues. Eating too little iron may cause you to feel weak and tired, and it can increase your risk for infection. Eating enough iron is necessary for your body's metabolism, muscle function, and nervous system. Iron is naturally found in many foods. It can also be added to foods or fortified in foods. There are two types of dietary iron:  Heme iron. Heme iron is absorbed by the body more easily than nonheme iron. Heme iron is found in meat, poultry, and fish.  Nonheme iron. Nonheme iron is found in dietary supplements, iron-fortified grains, beans, and vegetables.  You may need to follow an iron-rich diet if:  You have been diagnosed with iron deficiency or iron-deficiency anemia.  You have a condition that prevents you from absorbing dietary iron, such as: ? Infection in your intestines. ? Celiac disease. This involves long-lasting (chronic) inflammation of your intestines.  You do not eat enough iron.  You eat a diet that is high in foods that impair iron absorption.  You have lost a lot of blood.  You have heavy bleeding during your menstrual cycle.  You are pregnant.  What is my plan? Your health care provider may help you to determine how much iron you need per day based on your condition. Generally, when a person consumes sufficient amounts of iron in the diet, the following iron needs are met:  Men. ? 14-18 years old: 11 mg per day. ? 19-50 years old: 8 mg per day.  Women. ? 14-18 years old: 15 mg per day. ? 19-50 years old: 18 mg per day. ? Over 50 years old: 8 mg per day. ? Pregnant women: 27 mg per day. ? Breastfeeding women: 9 mg per day.  What do I need to know about an iron-rich diet?  Eat fresh fruits and vegetables that are high in vitamin C along with foods that are high in iron. This will help  increase the amount of iron that your body absorbs from food, especially with foods containing nonheme iron. Foods that are high in vitamin C include oranges, peppers, tomatoes, and mango.  Take iron supplements only as directed by your health care provider. Overdose of iron can be life-threatening. If you were prescribed iron supplements, take them with orange juice or a vitamin C supplement.  Cook foods in pots and pans that are made from iron.  Eat nonheme iron-containing foods alongside foods that are high in heme iron. This helps to improve your iron absorption.  Certain foods and drinks contain compounds that impair iron absorption. Avoid eating these foods in the same meal as iron-rich foods or with iron supplements. These include: ? Coffee, black tea, and red wine. ? Milk, dairy products, and foods that are high in calcium. ? Beans, soybeans, and peas. ? Whole grains.  When eating foods that contain both nonheme iron and compounds that impair iron absorption, follow these tips to absorb iron better. ? Soak beans overnight before cooking. ? Soak whole grains overnight and drain them before using. ? Ferment flours before baking, such as using yeast in bread dough. What foods can I eat? Grains Iron-fortified breakfast cereal. Iron-fortified whole-wheat bread. Enriched rice. Sprouted grains. Vegetables Spinach. Potatoes with skin. Green peas. Broccoli. Red and green bell peppers. Fermented vegetables. Fruits Prunes. Raisins. Oranges. Strawberries. Mango. Grapefruit. Meats and Other Protein Sources   Beef liver. Oysters. Beef. Shrimp. [place]. Chicken. [place]. Sardines. Chickpeas. Nuts. Tofu. Beverages Tomato juice. Fresh orange juice. Prune juice. Hibiscus tea. Fortified instant breakfast shakes. Condiments Tahini. Fermented soy sauce. Sweets and Desserts Black-strap molasses. Other Wheat germ. The items listed above may not be a complete list of recommended foods or beverages.  Contact your dietitian for more options. What foods are not recommended? Grains Whole grains. Bran cereal. Bran flour. Oats. Vegetables Artichokes. Brussels sprouts. Kale. Fruits Blueberries. Raspberries. Strawberries. Figs. Meats and Other Protein Sources Soybeans. Products made from soy protein. Dairy Milk. Cream. Cheese. Yogurt. Cottage cheese. Beverages Coffee. Black tea. Red wine. Sweets and Desserts Cocoa. Chocolate. Ice cream. Other Basil. Oregano. Parsley. The items listed above may not be a complete list of foods and beverages to avoid. Contact your dietitian for more information. This information is not intended to replace advice given to you by your health care provider. Make sure you discuss any questions you have with your health care provider. Document Released: 12/18/2004 Document Revised: 11/24/2015 Document Reviewed: 12/01/2013 Elsevier Interactive Patient Education  [employer].

## 2018-03-30 NOTE — Progress Notes (Signed)
  Subjective:     Patient ID: Jamie Allison, female   DOB: [birth year], 42 y.o.   MRN: [id number]  HPI Patient seen to discuss anemia.  She had recent physical.  Hemoglobin 10.8.  MCV 81.  Patient has fairly regular menses which are fairly heavy.  She wonders if she may be perimenopausal.  She does occasionally skip periods.  She relates that she has sickle cell trait which we were not aware of.  She states she has been tested twice and came back both times with sickle cell trait.  No history of sickle cell anemia.  She has is basically a Ambulance person.  She has eliminated most meats except for fish and she does consume eggs and some other dairy such as cheese and ice cream.  Past Medical History:  Diagnosis Date  . CHLAMYDIAL INFECTION 11/29/2009  . DEPRESSION 10/20/2008  . 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 Headache(784.0)   . Herpes   . HYPERTENSION 10/20/2008  . LUMBAR STRAIN, ACUTE 08/17/2009  . Polyuria 02/15/2010  . Sickle cell anemia (HCC)    trait   Past Surgical History:  Procedure Laterality Date  . CESAREAN SECTION     X 3  . TONSILLECTOMY AND ADENOIDECTOMY    . TUBAL LIGATION      reports that she has never smoked. She has never used smokeless tobacco. She reports that she drinks alcohol. She reports that she does not use drugs. family history includes Arthritis in her other; Depression in her other; Hypertension in her other. No Known Allergies   Review of Systems  Constitutional: Negative for appetite change and unexpected weight change.  Respiratory: Negative for shortness of breath.   Neurological: Negative for dizziness and light-headedness.       Objective:   Physical Exam  Constitutional: She appears well-developed and well-nourished.  Cardiovascular: Normal rate and regular rhythm.  Pulmonary/Chest: Effort normal and breath sounds normal.       Assessment:     Chronic anemia.  May very well be related to her sickle cell trait.  May have component of iron  deficiency as well related to heavy menses    Plan:     -Check further labs with repeat CBC, ferritin, serum iron, TIBC -Handout given on dietary iron and issues to maximize absorption -If iron deficiency shown on labs consider iron supplement  Kristian Covey MD [facility] at [facility]

## 2018-03-31 LAB — IRON,TIBC AND FERRITIN PANEL
%SAT: 9 % — AB (ref 16–45)
Ferritin: 21 ng/mL (ref 16–154)
IRON: 38 ug/dL — AB (ref 40–190)
TIBC: 402 ug/dL (ref 250–450)

## 2018-07-10 ENCOUNTER — Encounter: Payer: Self-pay | Admitting: Family Medicine

## 2018-07-10 ENCOUNTER — Ambulatory Visit: Payer: BC Managed Care – PPO | Admitting: Family Medicine

## 2018-07-10 ENCOUNTER — Other Ambulatory Visit: Payer: Self-pay

## 2018-07-10 VITALS — BP 148/88 | HR 69 | Temp 98.6°F | Ht 64.0 in | Wt 220.6 lb

## 2018-07-10 DIAGNOSIS — M25511 Pain in right shoulder: Secondary | ICD-10-CM | POA: Diagnosis not present

## 2018-07-10 DIAGNOSIS — A09 Infectious gastroenteritis and colitis, unspecified: Secondary | ICD-10-CM

## 2018-07-10 DIAGNOSIS — D509 Iron deficiency anemia, unspecified: Secondary | ICD-10-CM | POA: Diagnosis not present

## 2018-07-10 DIAGNOSIS — M549 Dorsalgia, unspecified: Secondary | ICD-10-CM | POA: Diagnosis not present

## 2018-07-10 LAB — CBC WITH DIFFERENTIAL/PLATELET
Basophils Absolute: 0 10*3/uL (ref 0.0–0.1)
Basophils Relative: 0.7 % (ref 0.0–3.0)
EOS ABS: 0.1 10*3/uL (ref 0.0–0.7)
EOS PCT: 2.9 % (ref 0.0–5.0)
HEMATOCRIT: 33.3 % — AB (ref 36.0–46.0)
Hemoglobin: 11.1 g/dL — ABNORMAL LOW (ref 12.0–15.0)
LYMPHS ABS: 2.2 10*3/uL (ref 0.7–4.0)
Lymphocytes Relative: 42.6 % (ref 12.0–46.0)
MCHC: 33.4 g/dL (ref 30.0–36.0)
MCV: 80.1 fl (ref 78.0–100.0)
MONO ABS: 0.4 10*3/uL (ref 0.1–1.0)
Monocytes Relative: 7.9 % (ref 3.0–12.0)
Neutro Abs: 2.3 10*3/uL (ref 1.4–7.7)
Neutrophils Relative %: 45.9 % (ref 43.0–77.0)
Platelets: 382 10*3/uL (ref 150.0–400.0)
RBC: 4.16 Mil/uL (ref 3.87–5.11)
RDW: 15.5 % (ref 11.5–15.5)
WBC: 5.1 10*3/uL (ref 4.0–10.5)

## 2018-07-10 MED ORDER — METHYLPREDNISOLONE ACETATE 40 MG/ML IJ SUSP
40.0000 mg | Freq: Once | INTRAMUSCULAR | Status: AC
  Administered 2018-07-10: 40 mg via INTRA_ARTICULAR

## 2018-07-10 NOTE — Patient Instructions (Signed)
Bland Diet A bland diet consists of foods that are often soft and do not have a lot of fat, fiber, or extra seasonings. Foods without fat, fiber, or seasoning are easier for the body to digest. They are also less likely to irritate your mouth, throat, stomach, and other parts of your digestive system. A bland diet is sometimes called a BRAT diet. What is my plan? Your health care provider or food and nutrition specialist (dietitian) may recommend specific changes to your diet to prevent symptoms or to treat your symptoms. These changes may include: Eating small meals often. Cooking food until it is soft enough to chew easily. Chewing your food well. Drinking fluids slowly. Not eating foods that are very spicy, sour, or fatty. Not eating citrus fruits, such as oranges and grapefruit. What do I need to know about this diet? Eat a variety of foods from the bland diet food list. Do not follow a bland diet longer than needed. Ask your health care provider whether you should take vitamins or supplements. What foods can I eat? Grains  Hot cereals, such as cream of wheat. Rice. Bread, crackers, or tortillas made from refined white flour. Vegetables Canned or cooked vegetables. Mashed or boiled potatoes. Fruits  Bananas. Applesauce. Other types of cooked or canned fruit with the skin and seeds removed, such as canned peaches or pears. Meats and other proteins  Scrambled eggs. Creamy peanut butter or other nut butters. Lean, well-cooked meats, such as chicken or fish. Tofu. Soups or broths. Dairy Low-fat dairy products, such as milk, cottage cheese, or yogurt. Beverages  Water. Herbal tea. Apple juice. Fats and oils Mild salad dressings. Canola or olive oil. Sweets and desserts Pudding. Custard. Fruit gelatin. Ice cream. The items listed above may not be a complete list of recommended foods and beverages. Contact a dietitian for more options. What foods are not recommended? Grains Whole  grain breads and cereals. Vegetables Raw vegetables. Fruits Raw fruits, especially citrus, berries, or dried fruits. Dairy Whole fat dairy foods. Beverages Caffeinated drinks. Alcohol. Seasonings and condiments Strongly flavored seasonings or condiments. Hot sauce. Salsa. Other foods Spicy foods. Fried foods. Sour foods, such as pickled or fermented foods. Foods with high sugar content. Foods high in fiber. The items listed above may not be a complete list of foods and beverages to avoid. Contact a dietitian for more information. Summary A bland diet consists of foods that are often soft and do not have a lot of fat, fiber, or extra seasonings. Foods without fat, fiber, or seasoning are easier for the body to digest. Check with your health care provider to see how long you should follow this diet plan. It is not meant to be followed for long periods. This information is not intended to replace advice given to you by your health care provider. Make sure you discuss any questions you have with your health care provider. Document Released: 08/28/2015 Document Revised: 06/04/2017 Document Reviewed: 06/04/2017 Elsevier Interactive Patient Education  2019 [employer]. Trigger Point Injection Trigger points are areas where you have pain. A trigger point injection is a shot given in the trigger point to help relieve pain for a few days to a few months. Common places for trigger points include:  The neck.  The shoulders.  The upper back.  The lower back. A trigger point injection will not cure long-lasting (chronic) pain permanently. These injections do not always work for every person, but for some people they can help to relieve pain  for a few days to a few months. Tell a health care provider about:  Any allergies you have.  All medicines you are taking, including vitamins, herbs, eye drops, creams, and over-the-counter medicines.  Any problems you or family members have had with  anesthetic medicines.  Any blood disorders you have.  Any surgeries you have had.  Any medical conditions you have. What are the risks? Generally, this is a safe procedure. However, problems may occur, including:  Infection.  Bleeding.  Allergic reaction to the injected medicine.  Irritation of the skin around the injection site. What happens before the procedure?  Ask your health care provider about changing or stopping your regular medicines. This is especially important if you are taking diabetes medicines or blood thinners. What happens during the procedure?  Your health care provider will feel for trigger points. A marker may be used to circle the area for the injection.  The skin over the trigger point will be washed with a germ-killing (antiseptic) solution.  A thin needle is used for the shot. You may feel pain or a twitching feeling when the needle enters the trigger point.  A numbing solution may be injected into the trigger point. Sometimes a medicine to keep down swelling, redness, and warmth (inflammation) is also injected.  Your health care provider may move the needle around the area where the trigger point is located until the tightness and twitching goes away.  After the injection, your health care provider may put gentle pressure over the injection site.  The injection site will be covered with a bandage (dressing). The procedure may vary among health care providers and hospitals. What happens after the procedure?  The dressing can be taken off in a few hours or as told by your health care provider.  You may feel sore and stiff for 1-2 days. This information is not intended to replace advice given to you by your health care provider. Make sure you discuss any questions you have with your health care provider. Document Released: 04/25/2011 Document Revised: 01/07/2016 Document Reviewed: 10/24/2014 Elsevier Interactive Patient Education  2019 [employer].

## 2018-07-10 NOTE — Progress Notes (Signed)
Subjective:     Patient ID: Jamie Allison, female   DOB: 1976/05/05, 43 y.o.   MRN: [id number]  HPI Patient is seen for the following issues  Onset Tuesday of some diffuse lower abdominal cramping.  These were somewhat intermittent.  By Wednesday morning she had some nonbloody diarrhea.  Her diarrhea is improving at this time.  No nausea or vomiting.  No recent antibiotics.  No travels.  No known sick contacts  Second issue is right upper back pain.  She states present for several months.  Denies any specific injury.  She has pain mostly along the superior medial aspect of the right scapula.  Symptoms worse with movement.  No upper extremity weakness or numbness.  History of iron deficiency anemia by labs in the fall.  She has been taking iron though somewhat inconsistently.  She has some constipation issues with the iron.  Past Medical History:  Diagnosis Date  . CHLAMYDIAL INFECTION 11/29/2009  . DEPRESSION 10/20/2008  . 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 Headache(784.0)   . Herpes   . HYPERTENSION 10/20/2008  . LUMBAR STRAIN, ACUTE 08/17/2009  . Polyuria 02/15/2010  . Sickle cell anemia (HCC)    trait   Past Surgical History:  Procedure Laterality Date  . CESAREAN SECTION     X 3  . TONSILLECTOMY AND ADENOIDECTOMY    . TUBAL LIGATION      reports that she has never smoked. She has never used smokeless tobacco. She reports current alcohol use. She reports that she does not use drugs. family history includes Arthritis in an other family member; Depression in an other family member; Hypertension in an other family member. No Known Allergies   Review of Systems  Constitutional: Negative for chills and fever.  Respiratory: Negative for shortness of breath.   Gastrointestinal: Positive for diarrhea. Negative for abdominal distention, blood in stool and rectal pain.  Genitourinary: Negative for dysuria.  Musculoskeletal: Positive for back pain.  Neurological: Negative for weakness  and numbness.       Objective:   Physical Exam Constitutional:      Appearance: She is well-developed.  Eyes:     Pupils: Pupils are equal, round, and reactive to light.  Neck:     Musculoskeletal: Neck supple.     Thyroid: No thyromegaly.     Vascular: No JVD.  Cardiovascular:     Rate and Rhythm: Normal rate and regular rhythm.     Heart sounds: No gallop.   Pulmonary:     Effort: Pulmonary effort is normal. No respiratory distress.     Breath sounds: Normal breath sounds. No wheezing or rales.  Abdominal:     General: Bowel sounds are normal.     Tenderness: There is no abdominal tenderness.  Musculoskeletal:     Comments: Patient has trigger point which is very tender to palpation right upper medial back just medial to the scapular region.  She has full range of motion right shoulder and full range of motion of the neck.  Neurological:     Mental Status: She is alert.     Comments: No upper extremity weakness        Assessment:     #1 trigger point right upper back  #2 recent diarrhea which is improving.  Suspect viral.  Patient does not appear clinically dehydrated  #3 history of iron deficiency anemia      Plan:     -Recheck CBC -Discussed appropriate diet for diarrhea.  Consider Imodium  if she has any recurrent significant diarrhea symptoms -We discussed risk and benefits of trigger point injection right upper back including risk of bleeding, bruising, infection and patient consented.  We localized her area of tenderness which is about 1 cm diameter which is just medial to the right upper scapular border.  Prepped skin with Betadine.  Using 1 inch 25-gauge needle injected 40 mg Depo-Medrol and 1 cc of plain Xylocaine.  Patient tolerated well.  Kristian Covey MD [facility] at [facility]

## 2019-03-09 ENCOUNTER — Encounter: Payer: Self-pay | Admitting: Family Medicine

## 2019-03-09 ENCOUNTER — Other Ambulatory Visit: Payer: Self-pay

## 2019-03-09 ENCOUNTER — Ambulatory Visit (INDEPENDENT_AMBULATORY_CARE_PROVIDER_SITE_OTHER): Payer: BC Managed Care – PPO | Admitting: Family Medicine

## 2019-03-09 VITALS — BP 122/84 | HR 80 | Temp 97.6°F | Resp 16 | Ht 64.0 in | Wt 224.8 lb

## 2019-03-09 DIAGNOSIS — Z0000 Encounter for general adult medical examination without abnormal findings: Secondary | ICD-10-CM

## 2019-03-09 DIAGNOSIS — Z113 Encounter for screening for infections with a predominantly sexual mode of transmission: Secondary | ICD-10-CM

## 2019-03-09 DIAGNOSIS — Z23 Encounter for immunization: Secondary | ICD-10-CM | POA: Diagnosis not present

## 2019-03-09 MED ORDER — FLUCONAZOLE 100 MG PO TABS: 100.0000 mg | ORAL_TABLET | Freq: Every day | ORAL | 0 refills | Status: DC

## 2019-03-09 NOTE — Progress Notes (Signed)
Subjective:     Patient ID: Jamie Allison, female   DOB: 06-Feb-1976, 43 y.o.   MRN: [id number]  HPI   Tabia is here for CPE.   She is still working at [employer].  No recent Covid symptoms.  She has had recurrent intertrigo rash which has responded to Fluconazole in the past.  She had tried anti-fungal creams and powders without relief. Takes no regular medications.  Needs tetanus booster.  Declines flu vaccine.  Last pap 2019 and normal.  She does have new partner and is requesting STD screening.  Asymptomatic.  Past Medical History:  Diagnosis Date  . CHLAMYDIAL INFECTION 11/29/2009  . DEPRESSION 10/20/2008  . 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 Headache(784.0)   . Herpes   . HYPERTENSION 10/20/2008  . LUMBAR STRAIN, ACUTE 08/17/2009  . Polyuria 02/15/2010  . Sickle cell anemia (HCC)    trait   Past Surgical History:  Procedure Laterality Date  . CESAREAN SECTION     X 3  . TONSILLECTOMY AND ADENOIDECTOMY    . TUBAL LIGATION      reports that she has never smoked. She has never used smokeless tobacco. She reports current alcohol use. She reports that she does not use drugs. family history includes Arthritis in an other family member; Depression in an other family member; Hypertension in an other family member. No Known Allergies     Review of Systems  Constitutional: Negative for activity change, appetite change, fatigue, fever and unexpected weight change.  HENT: Negative for ear pain, hearing loss, sore throat and trouble swallowing.   Eyes: Negative for visual disturbance.  Respiratory: Negative for cough and shortness of breath.   Cardiovascular: Negative for chest pain and palpitations.  Gastrointestinal: Negative for abdominal pain, blood in stool, constipation and diarrhea.  Genitourinary: Negative for dysuria, hematuria, pelvic pain, vaginal discharge and vaginal pain.  Musculoskeletal: Negative for arthralgias, back pain and myalgias.  Skin: Positive for  rash.  Neurological: Negative for dizziness, syncope and headaches.  Hematological: Negative for adenopathy.  Psychiatric/Behavioral: Negative for confusion and dysphoric mood.       Objective:   Physical Exam Vitals signs reviewed.  Constitutional:      Appearance: She is well-developed.  HENT:     Head: Normocephalic and atraumatic.  Eyes:     Pupils: Pupils are equal, round, and reactive to light.  Neck:     Musculoskeletal: Normal range of motion and neck supple.     Thyroid: No thyromegaly.  Cardiovascular:     Rate and Rhythm: Normal rate and regular rhythm.     Heart sounds: Normal heart sounds. No murmur.  Pulmonary:     Effort: No respiratory distress.     Breath sounds: Normal breath sounds. No wheezing or rales.  Abdominal:     General: Bowel sounds are normal. There is no distension.     Palpations: Abdomen is soft. There is no mass.     Tenderness: There is no abdominal tenderness. There is no guarding or rebound.  Musculoskeletal: Normal range of motion.  Lymphadenopathy:     Cervical: No cervical adenopathy.  Skin:    Findings: No rash.  Neurological:     Mental Status: She is alert and oriented to person, place, and time.     Cranial Nerves: No cranial nerve deficit.     Deep Tendon Reflexes: Reflexes normal.  Psychiatric:        Behavior: Behavior normal.  Thought Content: Thought content normal.        Judgment: Judgment normal.        Assessment:     Physical exam.  Only acute concern is recurrent intertrigo rash.    Plan:     - check labs.  Will check STD screen per pt request.  - consider baseline mammogram  -refilled Fluconazole tablets.  -flu vaccine recommended and declined.  -Tdap given.  Kristian Covey MD [facility] at [facility]

## 2019-03-10 LAB — LIPID PANEL
Cholesterol: 172 mg/dL (ref 0–200)
HDL: 41 mg/dL (ref >39.00)
LDL Cholesterol: 117 mg/dL — ABNORMAL HIGH (ref 0–99)
NonHDL: 130.85
Total CHOL/HDL Ratio: 4
Triglycerides: 70 mg/dL (ref 0.0–149.0)
VLDL: 14 mg/dL (ref 0.0–40.0)

## 2019-03-10 LAB — C. TRACHOMATIS/N. GONORRHOEAE RNA
C. trachomatis RNA, TMA: NOT DETECTED
N. gonorrhoeae RNA, TMA: NOT DETECTED

## 2019-03-10 LAB — HEPATIC FUNCTION PANEL
ALT: 7 U/L (ref 0–35)
AST: 10 U/L (ref 0–37)
Albumin: 4.1 g/dL (ref 3.5–5.2)
Alkaline Phosphatase: 56 U/L (ref 39–117)
Bilirubin, Direct: 0.1 mg/dL (ref 0.0–0.3)
Total Bilirubin: 0.4 mg/dL (ref 0.2–1.2)
Total Protein: 6.9 g/dL (ref 6.0–8.3)

## 2019-03-10 LAB — CBC WITH DIFFERENTIAL/PLATELET
Basophils Absolute: 0.1 10*3/uL (ref 0.0–0.1)
Basophils Relative: 0.9 % (ref 0.0–3.0)
Eosinophils Absolute: 0.1 10*3/uL (ref 0.0–0.7)
Eosinophils Relative: 1.9 % (ref 0.0–5.0)
HCT: 33.3 % — ABNORMAL LOW (ref 36.0–46.0)
Hemoglobin: 10.7 g/dL — ABNORMAL LOW (ref 12.0–15.0)
Lymphocytes Relative: 36.1 % (ref 12.0–46.0)
Lymphs Abs: 2.4 10*3/uL (ref 0.7–4.0)
MCHC: 32 g/dL (ref 30.0–36.0)
MCV: 79.1 fl (ref 78.0–100.0)
Monocytes Absolute: 0.4 10*3/uL (ref 0.1–1.0)
Monocytes Relative: 6.2 % (ref 3.0–12.0)
Neutro Abs: 3.6 10*3/uL (ref 1.4–7.7)
Neutrophils Relative %: 54.9 % (ref 43.0–77.0)
Platelets: 430 10*3/uL — ABNORMAL HIGH (ref 150.0–400.0)
RBC: 4.21 Mil/uL (ref 3.87–5.11)
RDW: 16.5 % — ABNORMAL HIGH (ref 11.5–15.5)
WBC: 6.6 10*3/uL (ref 4.0–10.5)

## 2019-03-10 LAB — BASIC METABOLIC PANEL
BUN: 9 mg/dL (ref 6–23)
CO2: 26 mEq/L (ref 19–32)
Calcium: 9.3 mg/dL (ref 8.4–10.5)
Chloride: 106 mEq/L (ref 96–112)
Creatinine, Ser: 0.75 mg/dL (ref 0.40–1.20)
GFR: 102.59 mL/min (ref >60.00)
Glucose, Bld: 90 mg/dL (ref 70–99)
Potassium: 4.1 mEq/L (ref 3.5–5.1)
Sodium: 140 mEq/L (ref 135–145)

## 2019-03-10 LAB — TSH: TSH: 0.81 u[IU]/mL (ref 0.35–4.50)

## 2019-03-10 LAB — RPR: RPR Ser Ql: NONREACTIVE

## 2019-03-10 LAB — HIV ANTIBODY (ROUTINE TESTING W REFLEX): HIV 1&2 Ab, 4th Generation: NONREACTIVE

## 2019-03-10 LAB — HEPATITIS B SURFACE ANTIGEN: Hepatitis B Surface Ag: NONREACTIVE

## 2019-04-12 ENCOUNTER — Other Ambulatory Visit: Payer: Self-pay

## 2019-04-12 ENCOUNTER — Telehealth (INDEPENDENT_AMBULATORY_CARE_PROVIDER_SITE_OTHER): Payer: BC Managed Care – PPO | Admitting: Family Medicine

## 2019-04-12 ENCOUNTER — Telehealth: Payer: Self-pay | Admitting: *Deleted

## 2019-04-12 DIAGNOSIS — R05 Cough: Secondary | ICD-10-CM | POA: Diagnosis not present

## 2019-04-12 DIAGNOSIS — R519 Headache, unspecified: Secondary | ICD-10-CM | POA: Diagnosis not present

## 2019-04-12 DIAGNOSIS — Z20822 Contact with and (suspected) exposure to covid-19: Secondary | ICD-10-CM

## 2019-04-12 DIAGNOSIS — R059 Cough, unspecified: Secondary | ICD-10-CM

## 2019-04-12 NOTE — Telephone Encounter (Signed)
Patient has an appointment at 11:45am today. FYI

## 2019-04-12 NOTE — Progress Notes (Signed)
This visit type was conducted due to national recommendations for restrictions regarding the COVID-19 pandemic in an effort to limit this patient's exposure and mitigate transmission in our community.   Virtual Visit via Telephone Note  I connected with Jamie Allison on 04/12/19 at 11:45 AM EST by telephone and verified that I am speaking with the correct person using two identifiers.   I discussed the limitations, risks, security and privacy concerns of performing an evaluation and management service by telephone and the availability of in person appointments. I also discussed with the patient that there may be a patient responsible charge related to this service. The patient expressed understanding and agreed to proceed.  Location patient: home Location provider: work or home office Participants present for the call: patient, provider Patient did not have a visit in the prior 7 days to address this/these issue(s).   History of Present Illness:  Jamie Allison had fairly acute onset last Wednesday of headache.  She noticed some increased fatigue and weakness and by the weekend had noticed loss of taste and smell.  She had some nonspecific lightheadedness.  She has had some cough but no dyspnea.  No fever.  Intermittent chills.  No diarrhea.  She has been tested for Covid twice now most recently about 2 weeks ago and both times negative.  She works at [employer] and there have been a couple coworkers there that have had positive tests.  Past Medical History:  Diagnosis Date  . CHLAMYDIAL INFECTION 11/29/2009  . DEPRESSION 10/20/2008  . 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 Headache(784.0)   . Herpes   . HYPERTENSION 10/20/2008  . LUMBAR STRAIN, ACUTE 08/17/2009  . Polyuria 02/15/2010  . Sickle cell anemia (HCC)    trait   Past Surgical History:  Procedure Laterality Date  . CESAREAN SECTION     X 3  . TONSILLECTOMY AND ADENOIDECTOMY    . TUBAL LIGATION      reports that she has never smoked. She  has never used smokeless tobacco. She reports current alcohol use. She reports that she does not use drugs. family history includes Arthritis in an other family member; Depression in an other family member; Hypertension in an other family member. No Known Allergies    Observations/Objective: Patient sounds cheerful and well on the phone. I do not appreciate any SOB. Speech and thought processing are grossly intact. Patient reported vitals:  Assessment and Plan: Relates onset last Wednesday of headache, loss of taste and smell, fatigue, cough.  Clinical suspicion for possible Covid syndrome.  Fortunately she does not have any major dyspnea or other complications  -We recommend repeat Covid testing -Strict quarantine until further clarified -Plenty of fluids and rest -Follow-up promptly for increased dyspnea or other concerns  Follow Up Instructions:  -As above   [id number] 5-10 [id number] 11-20 [id number] 21-30 I did not refer this patient for an OV in the next 24 hours for this/these issue(s).  I discussed the assessment and treatment plan with the patient. The patient was provided an opportunity to ask questions and all were answered. The patient agreed with the plan and demonstrated an understanding of the instructions.   The patient was advised to call back or seek an in-person evaluation if the symptoms worsen or if the condition fails to improve as anticipated.  I provided 13 minutes of non-face-to-face time during this encounter.   Carolann Littler, MD

## 2019-04-12 NOTE — Telephone Encounter (Signed)
Patient called after hours line. Patient reports she has headaches, taste and smell is gone, feels a little lightheaded, also a little dry cough since Wednesday. No fever. Her ribs are a little sore, aches in her thighs.

## 2019-04-13 LAB — NOVEL CORONAVIRUS, NAA: SARS-CoV-2, NAA: NOT DETECTED

## 2019-04-19 ENCOUNTER — Telehealth: Payer: Self-pay

## 2019-04-19 NOTE — Telephone Encounter (Signed)
Can do note but need to have specific dates she has been out of work.

## 2019-04-19 NOTE — Telephone Encounter (Signed)
Please see message.  Copied from [place] [phone]. Topic: General - Other >> Apr 19, 2019  8:40 AM Jamie Allison wrote: Pt need a note saying she tested negative and that she has been on quarantine because her son tested positive

## 2019-04-20 ENCOUNTER — Encounter: Payer: Self-pay | Admitting: Family Medicine

## 2019-04-20 NOTE — Telephone Encounter (Signed)
Called patient and she stated that she has been out of work since 04/07/19, she had her visit with you on 04/12/19 and she was given her test results on 04/13/19 and has to quarantine so she should be able to return to work on 04/28/19.  Place signed and completed letter on my desk when done and I will call her to let her know that I will mail it and confirm address. Thanks!

## 2019-04-20 NOTE — Telephone Encounter (Signed)
Called patient, verified address, and let patient know that I am mailing out the work note for her. Patient verbalized an understanding.

## 2019-04-20 NOTE — Telephone Encounter (Signed)
Note done

## 2019-04-27 ENCOUNTER — Other Ambulatory Visit: Payer: Self-pay

## 2019-04-27 DIAGNOSIS — Z20822 Contact with and (suspected) exposure to covid-19: Secondary | ICD-10-CM

## 2019-04-28 LAB — NOVEL CORONAVIRUS, NAA: SARS-CoV-2, NAA: NOT DETECTED

## 2019-07-16 ENCOUNTER — Other Ambulatory Visit (HOSPITAL_COMMUNITY)
Admission: RE | Admit: 2019-07-16 | Discharge: 2019-07-16 | Disposition: A | Discharge status: Home / Self Care | Payer: BC Managed Care – PPO | Source: Ambulatory Visit | Attending: Family Medicine | Admitting: Family Medicine

## 2019-07-16 ENCOUNTER — Encounter: Payer: Self-pay | Admitting: Family Medicine

## 2019-07-16 ENCOUNTER — Other Ambulatory Visit: Payer: Self-pay

## 2019-07-16 ENCOUNTER — Ambulatory Visit (INDEPENDENT_AMBULATORY_CARE_PROVIDER_SITE_OTHER): Payer: BC Managed Care – PPO | Admitting: Family Medicine

## 2019-07-16 VITALS — BP 170/90 | HR 87 | Temp 97.5°F | Wt 228.7 lb

## 2019-07-16 DIAGNOSIS — I10 Essential (primary) hypertension: Secondary | ICD-10-CM

## 2019-07-16 DIAGNOSIS — R300 Dysuria: Secondary | ICD-10-CM | POA: Diagnosis present

## 2019-07-16 LAB — POCT URINALYSIS DIPSTICK
Bilirubin, UA: NEGATIVE
Glucose, UA: NEGATIVE
Ketones, UA: NEGATIVE
Nitrite, UA: NEGATIVE
Protein, UA: NEGATIVE
Spec Grav, UA: 1.01 (ref 1.010–1.025)
Urobilinogen, UA: 0.2 E.U./dL
pH, UA: 6 (ref 5.0–8.0)

## 2019-07-16 MED ORDER — NITROFURANTOIN MONOHYD MACRO 100 MG PO CAPS: 100.0000 mg | ORAL_CAPSULE | Freq: Two times a day (BID) | ORAL | 0 refills | Status: DC

## 2019-07-16 NOTE — Progress Notes (Signed)
  Subjective:     Patient ID: Jamie Allison, female   DOB: 12/10/1975, 44 y.o.   MRN: [id number]  HPI   Jamie Allison is seen with approximately 1 week history of some mild urine frequency and burning with urination.  She has had some mild vaginal irritation but no discharge.  She does have a relatively new sexual partner.  No flank pain.  No pelvic pain.  No reported fevers or chills.  No nausea or vomiting.  Her blood pressure is up today and she does not have any history of hypertension.  No recent reported headaches.  No chest pain.   No regular ETOH use.    Past Medical History:  Diagnosis Date  . CHLAMYDIAL INFECTION 11/29/2009  . DEPRESSION 10/20/2008  . 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 Headache(784.0)   . Herpes   . HYPERTENSION 10/20/2008  . LUMBAR STRAIN, ACUTE 08/17/2009  . Polyuria 02/15/2010  . Sickle cell anemia (HCC)    trait   Past Surgical History:  Procedure Laterality Date  . CESAREAN SECTION     X 3  . TONSILLECTOMY AND ADENOIDECTOMY    . TUBAL LIGATION      reports that she has never smoked. She has never used smokeless tobacco. She reports current alcohol use. She reports that she does not use drugs. family history includes Arthritis in an other family member; Depression in an other family member; Hypertension in an other family member. No Known Allergies   Review of Systems  Constitutional: Negative for appetite change, chills and fever.  Gastrointestinal: Negative for abdominal pain, constipation, diarrhea, nausea and vomiting.  Genitourinary: Positive for dysuria and frequency.  Musculoskeletal: Negative for back pain.  Neurological: Negative for dizziness.       Objective:   Physical Exam Vitals reviewed.  Constitutional:      Appearance: Normal appearance.  Cardiovascular:     Rate and Rhythm: Normal rate and regular rhythm.  Pulmonary:     Effort: Pulmonary effort is normal.     Breath sounds: Normal breath sounds.  Neurological:     Mental  Status: She is alert.        Assessment:     #1  Dysuria.  Urine dipstick reveals small leukocytes and blood.  Patient nontoxic in appearance  May have uncomplicated cystitis, but with new partner need to rule out other potential source of dysuria such as Chlamydia.  #2 Hypertension.   She has been under some increased stress recently.  Was normal with CPE back in November.      Plan:     -Urine culture sent and cover with Macrobid 1 twice daily for 5 days pending culture results -Urine cytology was sent to check for chlamydia, GC, Gardnerella, trichomonas, yeast especially in view of recent new partner -lose weight, watch sodium, exercise and follow up in 3 weeks to reassess BP.  If up at that time will initiate medication.   Kristian Covey MD [facility] at [facility]

## 2019-07-16 NOTE — Patient Instructions (Signed)

## 2019-07-17 LAB — URINE CULTURE
MICRO NUMBER:: 10193633
Result:: NO GROWTH
SPECIMEN QUALITY:: ADEQUATE

## 2019-07-19 ENCOUNTER — Telehealth: Payer: Self-pay | Admitting: Family Medicine

## 2019-07-19 NOTE — Telephone Encounter (Signed)
Pt is requesting to go over lab results from 07/16/19.

## 2019-07-27 LAB — URINE CYTOLOGY ANCILLARY ONLY
Bacterial Vaginitis-Urine: POSITIVE — AB
Bacterial Vaginitis-Urine: POSITIVE — AB
Bacterial Vaginitis-Urine: POSITIVE — AB
Candida Urine: NEGATIVE
Chlamydia: NEGATIVE
Comment: NEGATIVE
Comment: NEGATIVE
Comment: NORMAL
Neisseria Gonorrhea: NEGATIVE
Trichomonas: POSITIVE — AB

## 2019-07-28 ENCOUNTER — Other Ambulatory Visit: Payer: Self-pay

## 2019-07-28 MED ORDER — METRONIDAZOLE 500 MG PO TABS: 500.0000 mg | ORAL_TABLET | Freq: Two times a day (BID) | ORAL | 0 refills | Status: DC

## 2019-07-28 NOTE — Telephone Encounter (Signed)
Pt calling to follow up on her lab results from 07/16/19. Pt has viewed them on Mychart but would like to speak to someone. Thanks

## 2019-07-28 NOTE — Telephone Encounter (Signed)
Called patient and gave her the lab results and sent in her prescription for her to the [facility] pharmacy. Patient verbalized an understanding.

## 2019-08-06 ENCOUNTER — Ambulatory Visit: Payer: BC Managed Care – PPO | Admitting: Family Medicine

## 2019-08-09 ENCOUNTER — Other Ambulatory Visit: Payer: Self-pay

## 2019-08-10 ENCOUNTER — Encounter: Payer: Self-pay | Admitting: Family Medicine

## 2019-08-10 ENCOUNTER — Other Ambulatory Visit (HOSPITAL_COMMUNITY)
Admission: RE | Admit: 2019-08-10 | Discharge: 2019-08-10 | Disposition: A | Discharge status: Home / Self Care | Payer: BC Managed Care – PPO | Source: Ambulatory Visit | Attending: Family Medicine | Admitting: Family Medicine

## 2019-08-10 ENCOUNTER — Ambulatory Visit (INDEPENDENT_AMBULATORY_CARE_PROVIDER_SITE_OTHER): Payer: BC Managed Care – PPO | Admitting: Family Medicine

## 2019-08-10 VITALS — BP 158/100 | HR 71 | Temp 97.9°F | Wt 231.3 lb

## 2019-08-10 DIAGNOSIS — I10 Essential (primary) hypertension: Secondary | ICD-10-CM | POA: Diagnosis not present

## 2019-08-10 DIAGNOSIS — Z113 Encounter for screening for infections with a predominantly sexual mode of transmission: Secondary | ICD-10-CM | POA: Insufficient documentation

## 2019-08-10 NOTE — Patient Instructions (Signed)
Hypertension, Adult High blood pressure (hypertension) is when the force of blood pumping through the arteries is too strong. The arteries are the blood vessels that carry blood from the heart throughout the body. Hypertension forces the heart to work harder to pump blood and may cause arteries to become narrow or stiff. Untreated or uncontrolled hypertension can cause a heart attack, heart failure, a stroke, kidney disease, and other problems. A blood pressure reading consists of a higher number over a lower number. Ideally, your blood pressure should be below 120/80. The first ("top") number is called the systolic pressure. It is a measure of the pressure in your arteries as your heart beats. The second ("bottom") number is called the diastolic pressure. It is a measure of the pressure in your arteries as the heart relaxes. What are the causes? The exact cause of this condition is not known. There are some conditions that result in or are related to high blood pressure. What increases the risk? Some risk factors for high blood pressure are under your control. The following factors may make you more likely to develop this condition:  Smoking.  Having type 2 diabetes mellitus, high cholesterol, or both.  Not getting enough exercise or physical activity.  Being overweight.  Having too much fat, sugar, calories, or salt (sodium) in your diet.  Drinking too much alcohol. Some risk factors for high blood pressure may be difficult or impossible to change. Some of these factors include:  Having chronic kidney disease.  Having a family history of high blood pressure.  Age. Risk increases with age.  Race. You may be at higher risk if you are African American.  Gender. Men are at higher risk than women before age 45. After age 65, women are at higher risk than men.  Having obstructive sleep apnea.  Stress. What are the signs or symptoms? High blood pressure may not cause symptoms. Very high  blood pressure (hypertensive crisis) may cause:  Headache.  Anxiety.  Shortness of breath.  Nosebleed.  Nausea and vomiting.  Vision changes.  Severe chest pain.  Seizures. How is this diagnosed? This condition is diagnosed by measuring your blood pressure while you are seated, with your arm resting on a flat surface, your legs uncrossed, and your feet flat on the floor. The cuff of the blood pressure monitor will be placed directly against the skin of your upper arm at the level of your heart. It should be measured at least twice using the same arm. Certain conditions can cause a difference in blood pressure between your right and left arms. Certain factors can cause blood pressure readings to be lower or higher than normal for a short period of time:  When your blood pressure is higher when you are in a health care provider's office than when you are at home, this is called white coat hypertension. Most people with this condition do not need medicines.  When your blood pressure is higher at home than when you are in a health care provider's office, this is called masked hypertension. Most people with this condition may need medicines to control blood pressure. If you have a high blood pressure reading during one visit or you have normal blood pressure with other risk factors, you may be asked to:  Return on a different day to have your blood pressure checked again.  Monitor your blood pressure at home for 1 week or longer. If you are diagnosed with hypertension, you may have other blood or   imaging tests to help your health care provider understand your overall risk for other conditions. How is this treated? This condition is treated by making healthy lifestyle changes, such as eating healthy foods, exercising more, and reducing your alcohol intake. Your health care provider may prescribe medicine if lifestyle changes are not enough to get your blood pressure under control, and  if:  Your systolic blood pressure is above 130.  Your diastolic blood pressure is above 80. Your personal target blood pressure may vary depending on your medical conditions, your age, and other factors. Follow these instructions at home: Eating and drinking   Eat a diet that is high in fiber and potassium, and low in sodium, added sugar, and fat. An example eating plan is called the DASH (Dietary Approaches to Stop Hypertension) diet. To eat this way: ? Eat plenty of fresh fruits and vegetables. Try to fill one half of your plate at each meal with fruits and vegetables. ? Eat whole grains, such as whole-wheat pasta, brown rice, or whole-grain bread. Fill about one fourth of your plate with whole grains. ? Eat or drink low-fat dairy products, such as skim milk or low-fat yogurt. ? Avoid fatty cuts of meat, processed or cured meats, and poultry with skin. Fill about one fourth of your plate with lean proteins, such as fish, chicken without skin, beans, eggs, or tofu. ? Avoid pre-made and processed foods. These tend to be higher in sodium, added sugar, and fat.  Reduce your daily sodium intake. Most people with hypertension should eat less than 1,500 mg of sodium a day.  Do not drink alcohol if: ? Your health care provider tells you not to drink. ? You are pregnant, may be pregnant, or are planning to become pregnant.  If you drink alcohol: ? Limit how much you use to:  0-1 drink a day for women.  0-2 drinks a day for men. ? Be aware of how much alcohol is in your drink. In the [place] one drink equals one 12 oz bottle of beer (355 mL), one 5 oz glass of wine (148 mL), or one 1 oz glass of hard liquor (44 mL). Lifestyle   Work with your health care provider to maintain a healthy body weight or to lose weight. Ask what an ideal weight is for you.  Get at least 30 minutes of exercise most days of the week. Activities may include walking, swimming, or biking.  Include exercise to  strengthen your muscles (resistance exercise), such as Pilates or lifting weights, as part of your weekly exercise routine. Try to do these types of exercises for 30 minutes at least 3 days a week.  Do not use any products that contain nicotine or tobacco, such as cigarettes, e-cigarettes, and chewing tobacco. If you need help quitting, ask your health care provider.  Monitor your blood pressure at home as told by your health care provider.  Keep all follow-up visits as told by your health care provider. This is important. Medicines  Take over-the-counter and prescription medicines only as told by your health care provider. Follow directions carefully. Blood pressure medicines must be taken as prescribed.  Do not skip doses of blood pressure medicine. Doing this puts you at risk for problems and can make the medicine less effective.  Ask your health care provider about side effects or reactions to medicines that you should watch for. Contact a health care provider if you:  Think you are having a reaction to a medicine you   are taking.  Have headaches that keep coming back (recurring).  Feel dizzy.  Have swelling in your ankles.  Have trouble with your vision. Get help right away if you:  Develop a severe headache or confusion.  Have unusual weakness or numbness.  Feel faint.  Have severe pain in your chest or abdomen.  Vomit repeatedly.  Have trouble breathing. Summary  Hypertension is when the force of blood pumping through your arteries is too strong. If this condition is not controlled, it may put you at risk for serious complications.  Your personal target blood pressure may vary depending on your medical conditions, your age, and other factors. For most people, a normal blood pressure is less than 120/80.  Hypertension is treated with lifestyle changes, medicines, or a combination of both. Lifestyle changes include losing weight, eating a healthy, low-sodium diet,  exercising more, and limiting alcohol. This information is not intended to replace advice given to you by your health care provider. Make sure you discuss any questions you have with your health care provider. Document Revised: 01/14/2018 Document Reviewed: 01/14/2018 Elsevier Patient Education  2020 [employer].  Lets get 2 month follow up.   Exercise and lose some weight Watch sodium intake  DASH Eating Plan DASH stands for "Dietary Approaches to Stop Hypertension." The DASH eating plan is a healthy eating plan that has been shown to reduce high blood pressure (hypertension). It may also reduce your risk for type 2 diabetes, heart disease, and stroke. The DASH eating plan may also help with weight loss. What are tips for following this plan?  General guidelines  Avoid eating more than 2,300 mg (milligrams) of salt (sodium) a day. If you have hypertension, you may need to reduce your sodium intake to 1,500 mg a day.  Limit alcohol intake to no more than 1 drink a day for nonpregnant women and 2 drinks a day for men. One drink equals 12 oz of beer, 5 oz of wine, or 1 oz of hard liquor.  Work with your health care provider to maintain a healthy body weight or to lose weight. Ask what an ideal weight is for you.  Get at least 30 minutes of exercise that causes your heart to beat faster (aerobic exercise) most days of the week. Activities may include walking, swimming, or biking.  Work with your health care provider or diet and nutrition specialist (dietitian) to adjust your eating plan to your individual calorie needs. Reading food labels   Check food labels for the amount of sodium per serving. Choose foods with less than 5 percent of the Daily Value of sodium. Generally, foods with less than 300 mg of sodium per serving fit into this eating plan.  To find whole grains, look for the word "whole" as the first word in the ingredient list. Shopping  Buy products labeled as "low-sodium"  or "no salt added."  Buy fresh foods. Avoid canned foods and premade or frozen meals. Cooking  Avoid adding salt when cooking. Use salt-free seasonings or herbs instead of table salt or sea salt. Check with your health care provider or pharmacist before using salt substitutes.  Do not fry foods. Cook foods using healthy methods such as baking, boiling, grilling, and broiling instead.  Cook with heart-healthy oils, such as olive, canola, soybean, or sunflower oil. Meal planning  Eat a balanced diet that includes: ? 5 or more servings of fruits and vegetables each day. At each meal, try to fill half of your plate  with fruits and vegetables. ? Up to 6-8 servings of whole grains each day. ? Less than 6 oz of lean meat, poultry, or fish each day. A 3-oz serving of meat is about the same size as a deck of cards. One egg equals 1 oz. ? 2 servings of low-fat dairy each day. ? A serving of nuts, seeds, or beans 5 times each week. ? Heart-healthy fats. Healthy fats called Omega-3 fatty acids are found in foods such as flaxseeds and coldwater fish, like sardines, salmon, and mackerel.  Limit how much you eat of the following: ? Canned or prepackaged foods. ? Food that is high in trans fat, such as fried foods. ? Food that is high in saturated fat, such as fatty meat. ? Sweets, desserts, sugary drinks, and other foods with added sugar. ? Full-fat dairy products.  Do not salt foods before eating.  Try to eat at least 2 vegetarian meals each week.  Eat more home-cooked food and less restaurant, buffet, and fast food.  When eating at a restaurant, ask that your food be prepared with less salt or no salt, if possible. What foods are recommended? The items listed may not be a complete list. Talk with your dietitian about what dietary choices are best for you. Grains Whole-grain or whole-wheat bread. Whole-grain or whole-wheat pasta. Brown rice. [place] Morrow. Bulgur. Whole-grain and low-sodium  cereals. Pita bread. Low-fat, low-sodium crackers. Whole-wheat flour tortillas. Vegetables Fresh or frozen vegetables (raw, steamed, roasted, or grilled). Low-sodium or reduced-sodium tomato and vegetable juice. Low-sodium or reduced-sodium tomato sauce and tomato paste. Low-sodium or reduced-sodium canned vegetables. Fruits All fresh, dried, or frozen fruit. Canned fruit in natural juice (without added sugar). Meat and other protein foods Skinless chicken or [place]. Ground chicken or [place]. Pork with fat trimmed off. Fish and seafood. Egg whites. Dried beans, peas, or lentils. Unsalted nuts, nut butters, and seeds. Unsalted canned beans. Lean cuts of beef with fat trimmed off. Low-sodium, lean deli meat. Dairy Low-fat (1%) or fat-free (skim) milk. Fat-free, low-fat, or reduced-fat cheeses. Nonfat, low-sodium ricotta or cottage cheese. Low-fat or nonfat yogurt. Low-fat, low-sodium cheese. Fats and oils Soft margarine without trans fats. Vegetable oil. Low-fat, reduced-fat, or light mayonnaise and salad dressings (reduced-sodium). Canola, safflower, olive, soybean, and sunflower oils. Avocado. Seasoning and other foods Herbs. Spices. Seasoning mixes without salt. Unsalted popcorn and pretzels. Fat-free sweets. What foods are not recommended? The items listed may not be a complete list. Talk with your dietitian about what dietary choices are best for you. Grains Baked goods made with fat, such as croissants, muffins, or some breads. Dry pasta or rice meal packs. Vegetables Creamed or fried vegetables. Vegetables in a cheese sauce. Regular canned vegetables (not low-sodium or reduced-sodium). Regular canned tomato sauce and paste (not low-sodium or reduced-sodium). Regular tomato and vegetable juice (not low-sodium or reduced-sodium). Angie Fava. Olives. Fruits Canned fruit in a light or heavy syrup. Fried fruit. Fruit in cream or butter sauce. Meat and other protein foods Fatty cuts of meat. Ribs.  Fried meat. Berniece Salines. Sausage. Bologna and other processed lunch meats. Salami. Fatback. Hotdogs. Bratwurst. Salted nuts and seeds. Canned beans with added salt. Canned or smoked fish. Whole eggs or egg yolks. Chicken or [place] with skin. Dairy Whole or 2% milk, cream, and half-and-half. Whole or full-fat cream cheese. Whole-fat or sweetened yogurt. Full-fat cheese. Nondairy creamers. Whipped toppings. Processed cheese and cheese spreads. Fats and oils Butter. Stick margarine. Lard. Shortening. Ghee. Bacon fat. Tropical oils, such as coconut,  palm kernel, or palm oil. Seasoning and other foods Salted popcorn and pretzels. Onion salt, garlic salt, seasoned salt, table salt, and sea salt. Worcestershire sauce. Tartar sauce. Barbecue sauce. Teriyaki sauce. Soy sauce, including reduced-sodium. Steak sauce. Canned and packaged gravies. Fish sauce. Oyster sauce. Cocktail sauce. Horseradish that you find on the shelf. Ketchup. Mustard. Meat flavorings and tenderizers. Bouillon cubes. Hot sauce and Tabasco sauce. Premade or packaged marinades. Premade or packaged taco seasonings. Relishes. Regular salad dressings. Where to find more information:  National Heart, Lung, and Blood Institute: PopSteam.is  American Heart Association: www.heart.org Summary  The DASH eating plan is a healthy eating plan that has been shown to reduce high blood pressure (hypertension). It may also reduce your risk for type 2 diabetes, heart disease, and stroke.  With the DASH eating plan, you should limit salt (sodium) intake to 2,300 mg a day. If you have hypertension, you may need to reduce your sodium intake to 1,500 mg a day.  When on the DASH eating plan, aim to eat more fresh fruits and vegetables, whole grains, lean proteins, low-fat dairy, and heart-healthy fats.  Work with your health care provider or diet and nutrition specialist (dietitian) to adjust your eating plan to your individual calorie needs. This  information is not intended to replace advice given to you by your health care provider. Make sure you discuss any questions you have with your health care provider. Document Revised: 04/18/2017 Document Reviewed: 04/29/2016 Elsevier Patient Education  2020 [employer].

## 2019-08-10 NOTE — Progress Notes (Signed)
Subjective:     Patient ID: Jamie Allison, female   DOB: 05-05-1976, 44 y.o.   MRN: [id number]  HPI Jamie Allison is here to discuss the following issues  Elevated blood pressure.  She has been treated for hypertension in the past.  She had fairly high reading last visit but was reluctant to consider medications.  She has not had any recent headaches or dizziness.  Not monitoring blood pressures regularly.  She prefers to try to manage this with lifestyle modification if possible.  She does have family history of hypertension.  Tries to watch her sodium intake.  No regular alcohol use.  Recent STD screening.  She did have positive trichomonas.  She was treated with metronidazole.  She has not had any significant ongoing vaginal discharge but does request repeat urine cytology to confirm this is cleared.  She is currently not sexually active.  No new partner since last visit.  Past Medical History:  Diagnosis Date  . CHLAMYDIAL INFECTION 11/29/2009  . DEPRESSION 10/20/2008  . 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 Headache(784.0)   . Herpes   . HYPERTENSION 10/20/2008  . LUMBAR STRAIN, ACUTE 08/17/2009  . Polyuria 02/15/2010  . Sickle cell anemia (HCC)    trait   Past Surgical History:  Procedure Laterality Date  . CESAREAN SECTION     X 3  . TONSILLECTOMY AND ADENOIDECTOMY    . TUBAL LIGATION      reports that she has never smoked. She has never used smokeless tobacco. She reports current alcohol use. She reports that she does not use drugs. family history includes Arthritis in an other family member; Depression in an other family member; Hypertension in an other family member. No Known Allergies     Review of Systems  Constitutional: Negative for fatigue.  Eyes: Negative for visual disturbance.  Respiratory: Negative for cough, chest tightness, shortness of breath and wheezing.   Cardiovascular: Negative for chest pain, palpitations and leg swelling.  Genitourinary: Negative for dysuria,  pelvic pain and vaginal discharge.  Neurological: Negative for dizziness, seizures, syncope, weakness, light-headedness and headaches.       Objective:   Physical Exam Constitutional:      Appearance: She is well-developed.  Eyes:     Pupils: Pupils are equal, round, and reactive to light.  Neck:     Thyroid: No thyromegaly.     Vascular: No JVD.  Cardiovascular:     Rate and Rhythm: Normal rate and regular rhythm.     Heart sounds: No gallop.   Pulmonary:     Effort: Pulmonary effort is normal. No respiratory distress.     Breath sounds: Normal breath sounds. No wheezing or rales.  Musculoskeletal:     Cervical back: Neck supple.  Neurological:     Mental Status: She is alert.        Assessment:     #1 hypertension.  Currently untreated.  We strongly advocated that she consider medication and at this point she is very reluctant  #2 history of recent trichomonas vaginitis.  Currently asymptomatic    Plan:     -Recheck urine cytology -We recommended considering medication such as amlodipine but at this point she is reluctant.  She is aware of dangers of not treating hypertension.  We discussed nonpharmacologic management with weight loss, DASH diet, increased exercise.  She does agree to 59-month follow-up.  She agrees to considering medication at that point if not to goal  Kristian Covey MD [facility]  at [facility]

## 2019-08-12 ENCOUNTER — Other Ambulatory Visit: Payer: Self-pay | Admitting: Family Medicine

## 2019-08-14 LAB — URINE CYTOLOGY ANCILLARY ONLY
Bacterial vaginitis: NEGATIVE
Candida vaginitis: NEGATIVE

## 2019-08-16 LAB — URINE CYTOLOGY ANCILLARY ONLY
Bacterial Vaginitis-Urine: NEGATIVE
Candida Urine: NEGATIVE
Chlamydia: NEGATIVE
Comment: NEGATIVE
Comment: NEGATIVE
Comment: NORMAL
Neisseria Gonorrhea: NEGATIVE
Trichomonas: NEGATIVE

## 2019-08-24 ENCOUNTER — Emergency Department (HOSPITAL_COMMUNITY)
Admission: EM | Admit: 2019-08-24 | Discharge: 2019-08-24 | Disposition: A | Discharge status: Home / Self Care | Payer: BC Managed Care – PPO | Attending: Emergency Medicine | Admitting: Emergency Medicine

## 2019-08-24 ENCOUNTER — Other Ambulatory Visit: Payer: Self-pay

## 2019-08-24 DIAGNOSIS — R110 Nausea: Secondary | ICD-10-CM | POA: Insufficient documentation

## 2019-08-24 DIAGNOSIS — Z79899 Other long term (current) drug therapy: Secondary | ICD-10-CM | POA: Insufficient documentation

## 2019-08-24 DIAGNOSIS — N946 Dysmenorrhea, unspecified: Secondary | ICD-10-CM | POA: Diagnosis not present

## 2019-08-24 DIAGNOSIS — R1013 Epigastric pain: Secondary | ICD-10-CM

## 2019-08-24 LAB — CBC
HCT: 35.8 % — ABNORMAL LOW (ref 36.0–46.0)
Hemoglobin: 11.2 g/dL — ABNORMAL LOW (ref 12.0–15.0)
MCH: 25.3 pg — ABNORMAL LOW (ref 26.0–34.0)
MCHC: 31.3 g/dL (ref 30.0–36.0)
MCV: 81 fL (ref 80.0–100.0)
Platelets: 447 10*3/uL — ABNORMAL HIGH (ref 150–400)
RBC: 4.42 MIL/uL (ref 3.87–5.11)
RDW: 15.3 % (ref 11.5–15.5)
WBC: 5 10*3/uL (ref 4.0–10.5)
nRBC: 0 % (ref 0.0–0.2)

## 2019-08-24 LAB — COMPREHENSIVE METABOLIC PANEL
ALT: 15 U/L (ref 0–44)
AST: 19 U/L (ref 15–41)
Albumin: 3.4 g/dL — ABNORMAL LOW (ref 3.5–5.0)
Alkaline Phosphatase: 46 U/L (ref 38–126)
Anion gap: 10 (ref 5–15)
BUN: 13 mg/dL (ref 6–20)
CO2: 21 mmol/L — ABNORMAL LOW (ref 22–32)
Calcium: 8.9 mg/dL (ref 8.9–10.3)
Chloride: 107 mmol/L (ref 98–111)
Creatinine, Ser: 0.81 mg/dL (ref 0.44–1.00)
GFR calc Af Amer: >60 mL/min (ref >60)
GFR calc non Af Amer: >60 mL/min (ref >60)
Glucose, Bld: 106 mg/dL — ABNORMAL HIGH (ref 70–99)
Potassium: 4.7 mmol/L (ref 3.5–5.1)
Sodium: 138 mmol/L (ref 135–145)
Total Bilirubin: 1.5 mg/dL — ABNORMAL HIGH (ref 0.3–1.2)
Total Protein: 6.3 g/dL — ABNORMAL LOW (ref 6.5–8.1)

## 2019-08-24 LAB — LIPASE, BLOOD: Lipase: 25 U/L (ref 11–51)

## 2019-08-24 LAB — URINALYSIS, ROUTINE W REFLEX MICROSCOPIC
Bilirubin Urine: NEGATIVE
Glucose, UA: NEGATIVE mg/dL
Ketones, ur: NEGATIVE mg/dL
Leukocytes,Ua: NEGATIVE
Nitrite: NEGATIVE
Protein, ur: NEGATIVE mg/dL
Specific Gravity, Urine: 1.015 (ref 1.005–1.030)
pH: 5 (ref 5.0–8.0)

## 2019-08-24 LAB — I-STAT BETA HCG BLOOD, ED (MC, WL, AP ONLY): I-stat hCG, quantitative: <5 m[IU]/mL (ref <5)

## 2019-08-24 MED ORDER — SODIUM CHLORIDE 0.9% FLUSH: 3.0000 mL | Freq: Once | INTRAVENOUS | Status: DC

## 2019-08-24 MED ORDER — FAMOTIDINE 20 MG PO TABS: 20.0000 mg | ORAL_TABLET | Freq: Two times a day (BID) | ORAL | 0 refills | Status: AC

## 2019-08-24 NOTE — ED Provider Notes (Signed)
[facility] EMERGENCY DEPARTMENT Provider Note   CSN: [id number] Arrival date & time: 08/24/19  0815     History Chief Complaint  Patient presents with  . Abdominal Pain  . Nausea    Jamie Allison is a 44 y.o. female presented to the emergency department of abdominal pain and nausea.  She reports about 3 days of gradual onset epigastric abdominal pain with nausea, no emesis.  She says in fact her appetite has been normal.  She has not had any worsening pain around eating.  She does note that the pain seems to come and go at times, and currently is gone away since waiting in her waiting room.  She has no pain at the time of my evaluation.  The pain does not radiate or travel anywhere.  She denies any dysuria or hematuria.  She was being treated for trichomonas infection but completed a course of Flagyl about 2 weeks ago.  Per her records, she tested negative for GC chlamydia or any other type of infection.  She denies any known history of kidney stones. She reports abdominal surgical history of C-sections in the past as well as tubal ligation.  She does report heavy menses which can be cramping and painful.  She is currently on her menstrual period as of 2 days ago.  She does not have an OB/GYN doctor for this.  She occasionally takes ibuprofen which does help her pain, but tells me she only takes 1 dose a month or so.  She does not take daily ibuprofen or NSAIDs.  NKDA        Past Medical History:  Diagnosis Date  . CHLAMYDIAL INFECTION 11/29/2009  . DEPRESSION 10/20/2008  . 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 Headache(784.0)   . Herpes   . HYPERTENSION 10/20/2008  . LUMBAR STRAIN, ACUTE 08/17/2009  . Polyuria 02/15/2010  . Sickle cell anemia (HCC)    trait    Patient Active Problem List   Diagnosis Date Noted  . Sickle cell trait (HCC) 03/30/2018  . Obesity (BMI 30-39.9) 04/16/2013  . POLYURIA 02/15/2010  . CHLAMYDIAL INFECTION 11/29/2009  . EXOGENOUS  OBESITY 11/29/2009  . UTI 11/29/2009  . LUMBAR STRAIN, ACUTE 08/17/2009  . HEADACHE 04/18/2009  . SKIN RASH 11/23/2008  . DEPRESSION 10/20/2008  . Essential hypertension 10/20/2008    Past Surgical History:  Procedure Laterality Date  . CESAREAN SECTION     X 3  . TONSILLECTOMY AND ADENOIDECTOMY    . TUBAL LIGATION       OB History   No obstetric history on file.     Family History  Problem Relation Age of Onset  . Arthritis Other   . Hypertension Other   . Depression Other     Social History   Tobacco Use  . Smoking status: Never Smoker  . Smokeless tobacco: Never Used  Substance Use Topics  . Alcohol use: Yes  . Drug use: No    Home Medications Prior to Admission medications   Medication Sig Start Date End Date Taking? Authorizing Provider  Biotin 70623 MCG TABS Take by mouth.    [provider]  famotidine (PEPCID) 20 MG tablet Take 1 tablet (20 mg total) by mouth 2 (two) times daily. 08/24/19 09/23/19  Terald Sleeper, MD  fluconazole (DIFLUCAN) 100 MG tablet Take 1 tablet (100 mg total) by mouth daily. 03/09/19   Burchette, Elberta Fortis, MD  metroNIDAZOLE (FLAGYL) 500 MG tablet Take 1 tablet (500 mg total)  by mouth 2 (two) times daily. 07/28/19   Burchette, Alinda Sierras, MD  multivitamin-iron-minerals-folic acid (CENTRUM) chewable tablet Chew 1 tablet by mouth daily. 09/04/15   Burchette, Alinda Sierras, MD  nitrofurantoin, macrocrystal-monohydrate, (MACROBID) 100 MG capsule Take 1 capsule (100 mg total) by mouth 2 (two) times daily. 07/16/19   Burchette, Alinda Sierras, MD    Allergies    Patient has no known allergies.  Review of Systems   Review of Systems  Constitutional: Negative for chills and fever.  HENT: Negative for ear pain and sore throat.   Eyes: Negative for pain and visual disturbance.  Respiratory: Negative for cough and shortness of breath.   Cardiovascular: Negative for chest pain and palpitations.  Gastrointestinal: Positive for abdominal pain and  nausea. Negative for constipation, diarrhea and vomiting.  Genitourinary: Negative for dysuria and hematuria.  Musculoskeletal: Negative for arthralgias and back pain.  Skin: Negative for color change and rash.  Neurological: Negative for syncope and light-headedness.  Psychiatric/Behavioral: Negative for agitation and confusion.  All other systems reviewed and are negative.   Physical Exam Updated Vital Signs BP (!) 165/86   Pulse (!) 53   Temp 98.5 F (36.9 C) (Oral)   Resp 20   Ht 5\' 4"  (1.626 m)   Wt 100.2 kg   SpO2 99%   BMI 37.93 kg/m   Physical Exam Vitals and nursing note reviewed.  Constitutional:      General: She is not in acute distress.    Appearance: She is well-developed.  HENT:     Head: Normocephalic and atraumatic.  Eyes:     Conjunctiva/sclera: Conjunctivae normal.  Cardiovascular:     Rate and Rhythm: Normal rate and regular rhythm.     Heart sounds: No murmur.  Pulmonary:     Effort: Pulmonary effort is normal. No respiratory distress.     Breath sounds: Normal breath sounds.  Abdominal:     Palpations: Abdomen is soft.     Tenderness: There is no abdominal tenderness. There is no right CVA tenderness, left CVA tenderness, guarding or rebound. Negative signs include Murphy's sign and McBurney's sign.  Musculoskeletal:     Cervical back: Neck supple.  Skin:    General: Skin is warm and dry.  Neurological:     General: No focal deficit present.     Mental Status: She is alert and oriented to person, place, and time.  Psychiatric:        Mood and Affect: Mood normal.        Behavior: Behavior normal.      ED Results / Procedures / Treatments   Labs (all labs ordered are listed, but only abnormal results are displayed) Labs Reviewed  COMPREHENSIVE METABOLIC PANEL - Abnormal; Notable for the following components:      Result Value   CO2 21 (*)    Glucose, Bld 106 (*)    Total Protein 6.3 (*)    Albumin 3.4 (*)    Total Bilirubin 1.5 (*)     All other components within normal limits  CBC - Abnormal; Notable for the following components:   Hemoglobin 11.2 (*)    HCT 35.8 (*)    MCH 25.3 (*)    Platelets 447 (*)    All other components within normal limits  URINALYSIS, ROUTINE W REFLEX MICROSCOPIC - Abnormal; Notable for the following components:   APPearance HAZY (*)    Hgb urine dipstick LARGE (*)    Bacteria, UA RARE (*)  All other components within normal limits  LIPASE, BLOOD  I-STAT BETA HCG BLOOD, ED ([facility], [facility], [facility] ONLY)    EKG None  Radiology No results found.  Procedures Procedures (including critical care time)  Medications Ordered in ED Medications - No data to display  ED Course  I have reviewed the triage vital signs and the nursing notes.  Pertinent labs & imaging results that were available during my care of the patient were reviewed by me and considered in my medical decision making (see chart for details).  44 yo female here with abdominal pain and nausea x 3 days.  She reports heavy menses with this.  Abdominal pain is epigastric.   This involves an extensive number of treatment options, and is a complaint that carries with it a high risk of complications and morbidity.  The differential diagnosis includes pancreatitis vs gastritis vs cholecystitis vs other  Based on her workup and her physical exam, I felt this was less likely an acute intraabdominal infection or inflammatory process, including pancreatitis, pyelonephritis, appendicitis, or cholecystitis.  She has no fever, no leukocytosis, and a soft and benign abdominal exam.   We discussed the possibility of a kidney stone, but I do not believe she needs emergent imaging, as mos of these pass spontaneously on their own.  She does not want a CT scan to look for a stone.   Her pain is minimal here, resolved on its own in the lobby.  We discussed possibility of gastritis.  She eats a lot of very spicy food.  We reviewed what kind of foods to  avoid, and I prescribed pepcid to see if this helps.  I recommended GI f/u if she does not improve, but she would prefer not to see them at this time.  Also advised women's health f/u for her heavy menses with clots.  It's possible she has uterine fibroids.  Motrin helps (she takes it sparingly), but there may be alternative options like hormonal therapy that would spare her stomach.  Here her Pregnancy is negative, and she has had tubal ligation.   Final Clinical Impression(s) / ED Diagnoses Final diagnoses:  Epigastric pain  Dysmenorrhea    Rx / DC Orders ED Discharge Orders         Ordered    famotidine (PEPCID) 20 MG tablet  2 times daily     08/24/19 1723           Terald Sleeper, MD 08/25/19 [phone]

## 2019-08-24 NOTE — ED Triage Notes (Signed)
Pt here from home for evaluation of mid abdominal pain and nausea since Sunday when she started her period. Pt tolerating PO intake without emesis.  Advised by PCP office to come to ED for evaluation.

## 2019-08-24 NOTE — ED Notes (Signed)
Pt d/c home per MD order. Discharge summary reviewed with pt, pt verbalizes understanding. No s/s of acute distress noted. Off unit via WC.  °

## 2019-08-24 NOTE — Discharge Instructions (Signed)
You can take tylenol for your menstrual period pain.  Ibuprofen (600 mg every 6 hours) can be more effective, but also can be harmful or irritating to your stomach.  I would avoid ibuprofen/motrin/advil/aspirin (NSAIDS), or try to minimize taking them to once or twice as needed, per month, with your menses.  You can follow up with the women's health clinic for your heavy and painful periods.  I've included a list of foods which you should avoid that can irritate your stomach.

## 2019-10-11 ENCOUNTER — Ambulatory Visit: Payer: BC Managed Care – PPO | Admitting: Family Medicine

## 2019-10-19 ENCOUNTER — Ambulatory Visit: Payer: BC Managed Care – PPO | Admitting: Family Medicine

## 2020-02-09 ENCOUNTER — Other Ambulatory Visit (HOSPITAL_COMMUNITY)
Admission: RE | Admit: 2020-02-09 | Discharge: 2020-02-09 | Disposition: A | Discharge status: Home / Self Care | Payer: BC Managed Care – PPO | Source: Ambulatory Visit | Attending: Family Medicine | Admitting: Family Medicine

## 2020-02-09 ENCOUNTER — Other Ambulatory Visit: Payer: Self-pay

## 2020-02-09 ENCOUNTER — Ambulatory Visit: Payer: BC Managed Care – PPO | Admitting: Family Medicine

## 2020-02-09 VITALS — BP 144/78 | HR 72 | Temp 98.9°F | Wt 222.2 lb

## 2020-02-09 DIAGNOSIS — M549 Dorsalgia, unspecified: Secondary | ICD-10-CM

## 2020-02-09 DIAGNOSIS — M25511 Pain in right shoulder: Secondary | ICD-10-CM

## 2020-02-09 DIAGNOSIS — Z113 Encounter for screening for infections with a predominantly sexual mode of transmission: Secondary | ICD-10-CM

## 2020-02-09 MED ORDER — NITROFURANTOIN MONOHYD MACRO 100 MG PO CAPS: 100.0000 mg | ORAL_CAPSULE | Freq: Two times a day (BID) | ORAL | 0 refills | Status: DC

## 2020-02-09 MED ORDER — METHYLPREDNISOLONE ACETATE 40 MG/ML IJ SUSP
40.0000 mg | Freq: Once | INTRAMUSCULAR | Status: AC
  Administered 2020-02-09: 40 mg via INTRA_ARTICULAR

## 2020-02-09 NOTE — Patient Instructions (Signed)
Trigger Point Injection Trigger points are areas where you have pain. A trigger point injection is a shot given in the trigger point to help relieve pain for a few days to a few months. Common places for trigger points include:  The neck.  The shoulders.  The upper back.  The lower back. A trigger point injection will not cure long-term (chronic) pain permanently. These injections do not always work for every person. For some people, they can help to relieve pain for a few days to a few months. Tell a health care provider about:  Any allergies you have.  All medicines you are taking, including vitamins, herbs, eye drops, creams, and over-the-counter medicines.  Any problems you or family members have had with anesthetic medicines.  Any blood disorders you have.  Any surgeries you have had.  Any medical conditions you have. What are the risks? Generally, this is a safe procedure. However, problems may occur, including:  Infection.  Bleeding or bruising.  Allergic reaction to the injected medicine.  Irritation of the skin around the injection site. What happens before the procedure? Ask your health care provider about:  Changing or stopping your regular medicines. This is especially important if you are taking diabetes medicines or blood thinners.  Taking medicines such as aspirin and ibuprofen. These medicines can thin your blood. Do not take these medicines unless your health care provider tells you to take them.  Taking over-the-counter medicines, vitamins, herbs, and supplements. What happens during the procedure?   Your health care provider will feel for trigger points. A marker may be used to circle the area for the injection.  The skin over the trigger point will be washed with a germ-killing (antiseptic) solution.  A thin needle is used for the injection. You may feel pain or a twitching feeling when the needle enters the trigger point.  A numbing solution may  be injected into the trigger point. Sometimes a medicine to keep down inflammation is also injected.  Your health care provider may move the needle around the area where the trigger point is located until the tightness and twitching goes away.  After the injection, your health care provider may put gentle pressure over the injection site.  The injection site will be covered with a bandage (dressing). The procedure may vary among health care providers and hospitals. What can I expect after treatment? After treatment, you may have:  Soreness and stiffness for 1-2 days.  A dressing. This can be taken off in a few hours or as told by your health care provider. Follow these instructions at home: Injection site care  Remove your dressing as told by your health care provider.  Check your injection site every day for signs of infection. Check for: ? Redness, swelling, or pain. ? Fluid or blood. ? Warmth. ? Pus or a bad smell. Managing pain, stiffness, and swelling  If directed, put ice on the affected area. ? Put ice in a plastic bag. ? Place a towel between your skin and the bag. ? Leave the ice on for 20 minutes, 2-3 times a day. General instructions  If you were asked to stop your regular medicines, ask your health care provider when you may start taking them again.  Return to your normal activities as told by your health care provider. Ask your health care provider what activities are safe for you.  Do not take baths, swim, or use a hot tub until your health care provider approves.    You may be asked to see an occupational or physical therapist for exercises that reduce muscle strain and stretch the area of the trigger point.  Keep all follow-up visits as told by your health care provider. This is important. Contact a health care provider if:  Your pain comes back, and it is worse than before the injection. You may need more injections.  You have chills or a fever.  The  injection site becomes more painful, red, swollen, or warm to the touch. Summary  A trigger point injection is a shot given in the trigger point to help relieve pain for a few days to a few months.  Common places for trigger point injections are the neck, shoulder, upper back, and lower back.  These injections do not always work for every person, but for some people, the injections can help to relieve pain for a few days to a few months.  Contact a health care provider if symptoms come back or they are worse than before treatment. Also, get help if the injection site becomes more painful, red, swollen, or warm to the touch. This information is not intended to replace advice given to you by your health care provider. Make sure you discuss any questions you have with your health care provider. Document Revised: 06/17/2018 Document Reviewed: 06/17/2018 Elsevier Patient Education  2020 Elsevier Inc[employer]  

## 2020-02-09 NOTE — Progress Notes (Signed)
Established Patient Office Visit  Subjective:  Patient ID: Jamie Allison, female    DOB: 1976-03-15  Age: 44 y.o. MRN: [id number]  CC:  Chief Complaint  Patient presents with  . Shoulder Pain    HPI Jamie Allison presents for the following issues.  She initially complained of right shoulder pain.  On further questioning her pain is more right upper back along the upper medial aspect of her scapula.  She denies any injury.  She has had a lot of stress and tension and thinks her pain may be related to that.  She has intermittent sharp pain.  Sometimes radiates up toward the occipital area.  Her pain is sore to touch.  She has tried mentholated topical and heat and ice without relief.  No radiculitis symptoms.  No upper extremity numbness or weakness.  No pain in the actual shoulder itself.  Pain is moderate  She is requesting STD screening.  She has a new partner.  Asymptomatic.  No dysuria.  No rashes.  No vaginal discharge.  Past Medical History:  Diagnosis Date  . CHLAMYDIAL INFECTION 11/29/2009  . DEPRESSION 10/20/2008  . 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 Headache(784.0)   . Herpes   . HYPERTENSION 10/20/2008  . LUMBAR STRAIN, ACUTE 08/17/2009  . Polyuria 02/15/2010  . Sickle cell anemia (HCC)    trait    Past Surgical History:  Procedure Laterality Date  . CESAREAN SECTION     X 3  . TONSILLECTOMY AND ADENOIDECTOMY    . TUBAL LIGATION      Family History  Problem Relation Age of Onset  . Arthritis Other   . Hypertension Other   . Depression Other     Social History   Socioeconomic History  . Marital status: Single    Spouse name: Not on file  . Number of children: Not on file  . Years of education: Not on file  . Highest education level: Not on file  Occupational History  . Not on file  Tobacco Use  . Smoking status: Never Smoker  . Smokeless tobacco: Never Used  Vaping Use  . Vaping Use: Never used  Substance and Sexual Activity  . Alcohol use: Yes    . Drug use: No  . Sexual activity: Not on file  Other Topics Concern  . Not on file  Social History Narrative  . Not on file   Social Determinants of Health   Financial Resource Strain:   . Difficulty of Paying Living Expenses: Not on file  Food Insecurity:   . Worried About Programme researcher, broadcasting/film/video in the Last Year: Not on file  . Ran Out of Food in the Last Year: Not on file  Transportation Needs:   . Lack of Transportation (Medical): Not on file  . Lack of Transportation (Non-Medical): Not on file  Physical Activity:   . Days of Exercise per Week: Not on file  . Minutes of Exercise per Session: Not on file  Stress:   . Feeling of Stress : Not on file  Social Connections:   . Frequency of Communication with Friends and Family: Not on file  . Frequency of Social Gatherings with Friends and Family: Not on file  . Attends Religious Services: Not on file  . Active Member of Clubs or Organizations: Not on file  . Attends Banker Meetings: Not on file  . Marital Status: Not on file  Intimate Partner Violence:   . Fear of Current  or Ex-Partner: Not on file  . Emotionally Abused: Not on file  . Physically Abused: Not on file  . Sexually Abused: Not on file    Outpatient Medications Prior to Visit  Medication Sig Dispense Refill  . Biotin 16109 MCG TABS Take by mouth.    . famotidine (PEPCID) 20 MG tablet Take 1 tablet (20 mg total) by mouth 2 (two) times daily. 60 tablet 0  . fluconazole (DIFLUCAN) 100 MG tablet Take 1 tablet (100 mg total) by mouth daily. 7 tablet 0  . metroNIDAZOLE (FLAGYL) 500 MG tablet Take 1 tablet (500 mg total) by mouth 2 (two) times daily. 14 tablet 0  . multivitamin-iron-minerals-folic acid (CENTRUM) chewable tablet Chew 1 tablet by mouth daily. 30 tablet   . nitrofurantoin, macrocrystal-monohydrate, (MACROBID) 100 MG capsule Take 1 capsule (100 mg total) by mouth 2 (two) times daily. 10 capsule 0   No facility-administered medications prior  to visit.    No Known Allergies  ROS Review of Systems  Constitutional: Negative for chills and fever.  Respiratory: Negative for cough and shortness of breath.   Cardiovascular: Negative for chest pain and palpitations.  Gastrointestinal: Negative for abdominal pain.  Musculoskeletal: Positive for back pain.  Skin: Negative for rash.  Neurological: Negative for weakness and numbness.  Hematological: Negative for adenopathy.      Objective:    Physical Exam Vitals reviewed.  Constitutional:      Appearance: Normal appearance.  Cardiovascular:     Rate and Rhythm: Normal rate and regular rhythm.  Pulmonary:     Effort: Pulmonary effort is normal.     Breath sounds: Normal breath sounds.  Musculoskeletal:     Cervical back: Neck supple.     Comments: She has full range of motion of shoulder with no localized tenderness around the right shoulder region.  She has tenderness of her right upper back along the superior aspect of the scapular region.  This is very localized over an area about 2 x 2 cm.  No rash  Neurological:     Mental Status: She is alert.     BP (!) 144/78 (BP Location: Left Arm, Patient Position: Sitting, Cuff Size: Normal)   Pulse 72   Temp 98.9 F (37.2 C)   Wt 222 lb 3.2 oz (100.8 kg)   SpO2 98%   BMI 38.14 kg/m  Wt Readings from Last 3 Encounters:  02/09/20 222 lb 3.2 oz (100.8 kg)  08/24/19 221 lb (100.2 kg)  08/10/19 231 lb 4.8 oz (104.9 kg)     Health Maintenance Due  Topic Date Due  . Hepatitis C Screening  Never done  . COVID-19 Vaccine (1) Never done  . INFLUENZA VACCINE  12/19/2019    There are no preventive care reminders to display for this patient.  Lab Results  Component Value Date   TSH 0.81 03/09/2019   Lab Results  Component Value Date   WBC 5.0 08/24/2019   HGB 11.2 (L) 08/24/2019   HCT 35.8 (L) 08/24/2019   MCV 81.0 08/24/2019   PLT 447 (H) 08/24/2019   Lab Results  Component Value Date   NA 138 08/24/2019    K 4.7 08/24/2019   CO2 21 (L) 08/24/2019   GLUCOSE 106 (H) 08/24/2019   BUN 13 08/24/2019   CREATININE 0.81 08/24/2019   BILITOT 1.5 (H) 08/24/2019   ALKPHOS 46 08/24/2019   AST 19 08/24/2019   ALT 15 08/24/2019   PROT 6.3 (L) 08/24/2019   ALBUMIN 3.4 (  L) 08/24/2019   CALCIUM 8.9 08/24/2019   ANIONGAP 10 08/24/2019   GFR 102.59 03/09/2019   Lab Results  Component Value Date   CHOL 172 03/09/2019   Lab Results  Component Value Date   HDL 41.00 03/09/2019   Lab Results  Component Value Date   LDLCALC 117 (H) 03/09/2019   Lab Results  Component Value Date   TRIG 70.0 03/09/2019   Lab Results  Component Value Date   CHOLHDL 4 03/09/2019   No results found for: HGBA1C    Assessment & Plan:   Problem List Items Addressed This Visit    None    Visit Diagnoses    Screen for STD (sexually transmitted disease)    -  Primary   Relevant Orders   Urine cytology ancillary only   HIV antibody (with reflex)   RPR   Right shoulder pain, unspecified chronicity       Relevant Medications   methylPREDNISolone acetate (DEPO-MEDROL) injection 40 mg (Completed)   Trigger point with back pain       Relevant Medications   methylPREDNISolone acetate (DEPO-MEDROL) injection 40 mg (Completed)    -Patient presents with trigger point right upper back.  She has tried conservative things at home without much relief.  We discussed risk and benefits of steroid and Xylocaine injection including risk of bleeding, bruising, low risk of infection.  Patient consented.  Using 25-gauge 1 inch needle injected 1 cc of plain Xylocaine and 40 mg Depo-Medrol into the localized area of tenderness and patient tolerated well.  We recommend she try some icing 15 to 20 minutes 2-3 times daily and also continue with her muscle massage and over-the-counter sports creams and touch base if not improved in 1 week  -Patient requesting STD screening.  Currently asymptomatic.  New partner.  Check HIV, RPR, urine  cytology for chlamydia, gonorrhea, trichomonas  Meds ordered this encounter  Medications  . methylPREDNISolone acetate (DEPO-MEDROL) injection 40 mg  . nitrofurantoin, macrocrystal-monohydrate, (MACROBID) 100 MG capsule    Sig: Take 1 capsule (100 mg total) by mouth 2 (two) times daily.    Dispense:  10 capsule    Refill:  0    Follow-up: No follow-ups on file.    Evelena Peat, MD

## 2020-02-09 NOTE — Progress Notes (Signed)
depo 

## 2020-02-10 ENCOUNTER — Telehealth: Payer: Self-pay | Admitting: Family Medicine

## 2020-02-10 LAB — HIV ANTIBODY (ROUTINE TESTING W REFLEX): HIV 1&2 Ab, 4th Generation: NONREACTIVE

## 2020-02-10 LAB — EXTRA LAV TOP TUBE

## 2020-02-10 LAB — RPR: RPR Ser Ql: NONREACTIVE

## 2020-02-10 NOTE — Telephone Encounter (Signed)
Pt said she doesn't think she got the same medication as before. She doesn't want to take them.  nitrofurantoin, macrocrystal-monohydrate, (MACROBID) 100 MG capsule    She prefer the ones she was on before which she thinks were the   fluconazole (DIFLUCAN) 100 MG tablet    [id number] - [place] - [address]  [address] [place] ([place]) [place] [address]  Phone:  [phone] Fax:  [phone]    Please advise

## 2020-02-11 LAB — URINE CYTOLOGY ANCILLARY ONLY
Chlamydia: NEGATIVE
Comment: NEGATIVE
Comment: NEGATIVE
Comment: NORMAL
Neisseria Gonorrhea: NEGATIVE
Trichomonas: NEGATIVE

## 2020-02-14 MED ORDER — FLUCONAZOLE 100 MG PO TABS: 100.0000 mg | ORAL_TABLET | Freq: Every day | ORAL | 0 refills | Status: DC

## 2020-02-14 NOTE — Addendum Note (Signed)
Addended by: Kern Reap B on: 02/14/2020 01:16 PM   Modules accepted: Orders

## 2020-02-14 NOTE — Telephone Encounter (Signed)
Rx sent and patient is aware. 

## 2020-02-14 NOTE — Telephone Encounter (Signed)
Refill fluconazole 100 mg once daily for 7 days.  I am not sure how Macrobid got sent in.

## 2020-12-12 ENCOUNTER — Encounter: Payer: Self-pay | Admitting: Family Medicine

## 2020-12-12 ENCOUNTER — Ambulatory Visit: Payer: BC Managed Care – PPO | Admitting: Family Medicine

## 2020-12-12 ENCOUNTER — Other Ambulatory Visit: Payer: Self-pay

## 2020-12-12 VITALS — BP 140/90 | HR 70 | Temp 98.4°F | Wt 220.3 lb

## 2020-12-12 DIAGNOSIS — M5416 Radiculopathy, lumbar region: Secondary | ICD-10-CM

## 2020-12-12 DIAGNOSIS — B356 Tinea cruris: Secondary | ICD-10-CM | POA: Diagnosis not present

## 2020-12-12 MED ORDER — FLUCONAZOLE 100 MG PO TABS: 100.0000 mg | ORAL_TABLET | Freq: Every day | ORAL | 0 refills | Status: DC

## 2020-12-12 MED ORDER — PREDNISONE 20 MG PO TABS: ORAL_TABLET | ORAL | 0 refills | Status: DC

## 2020-12-12 NOTE — Progress Notes (Signed)
Established Patient Office Visit  Subjective:  Patient ID: Jamie Allison, female    DOB: 06/21/1975  Age: 45 y.o. MRN: [id number]  CC: No chief complaint on file.   HPI YOLETTE HASTINGS presents for the following issues  New problem of pain radiating from her right buttock area all the way down to the ankle.  She states that back in April she was rollerskating and fell fairly hard on her right hip area.  She was able to finish skating that day and able to ambulate afterwards without much difficulty.  Things seem to be improving and then around the end of June she developed some achy pain radiating down the right lower extremity.  Pain at 1 point was 10 out of 10 currently about 6 out of 10.  Achy quality and worse with prolonged periods of sitting.  She is currently commuting from [facility] all the way to [place] each day for work.  She works at the [employer] at [employer] and [employer].  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  She had great difficulty with working without actually all last week.  Denies any prior back difficulties.  No urine or stool incontinence.  No lower extremity numbness or weakness.  No alleviating factors.  She states she saw urgent care doctor about a week ago and was prescribed some sort of medication but never started this.  Other issue is recurrent tinea cruris.  She is tried multiple topicals in the past without success and has gotten relief only with oral fluconazole and requesting refill.  No history of diabetes.  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   Headache(784.0)    Herpes    HYPERTENSION 10/20/2008   LUMBAR STRAIN, ACUTE 08/17/2009   Polyuria 02/15/2010   Sickle cell anemia (HCC)    trait    Past Surgical History:  Procedure Laterality Date   CESAREAN SECTION     X 3    TONSILLECTOMY AND ADENOIDECTOMY     TUBAL LIGATION      Family History  Problem Relation Age of Onset   Arthritis Other    Hypertension Other    Depression Other     Social History   Socioeconomic History   Marital status: Single    Spouse name: Not on file   Number of children: Not on file   Years of education: Not on file   Highest education level: Not on file  Occupational History   Not on file  Tobacco Use   Smoking status: Never   Smokeless tobacco: Never  Vaping Use   Vaping Use: Never used  Substance and Sexual Activity   Alcohol use: Yes   Drug use: No   Sexual activity: Not on file  Other Topics Concern   Not on file  Social History Narrative   Not on file   Social Determinants of Health   Financial Resource Strain: Not on file  Food Insecurity: Not on file  Transportation Needs: Not on file  Physical Activity: Not on file  Stress: Not on file  Social Connections: Not on file  Intimate Partner Violence: Not on file    Outpatient Medications Prior to Visit  Medication Sig Dispense Refill   Biotin 44920 MCG TABS Take by mouth.     famotidine (PEPCID) 20 MG tablet Take 1 tablet (20 mg total)  by mouth 2 (two) times daily. 60 tablet 0   metroNIDAZOLE (FLAGYL) 500 MG tablet Take 1 tablet (500 mg total) by mouth 2 (two) times daily. (Patient not taking: Reported on 12/12/2020) 14 tablet 0   multivitamin-iron-minerals-folic acid (CENTRUM) chewable tablet Chew 1 tablet by mouth daily. 30 tablet    fluconazole (DIFLUCAN) 100 MG tablet Take 1 tablet (100 mg total) by mouth daily. (Patient not taking: Reported on 12/12/2020) 7 tablet 0   nitrofurantoin, macrocrystal-monohydrate, (MACROBID) 100 MG capsule Take 1 capsule (100 mg total) by mouth 2 (two) times daily. 10 capsule 0   No facility-administered medications prior to visit.    No Known Allergies  ROS Review of Systems  Constitutional:  Negative for chills and fever.  Cardiovascular:  Negative for chest  pain.  Gastrointestinal:  Negative for abdominal pain.  Genitourinary:  Negative for dysuria.  Musculoskeletal:  Positive for back pain.  Skin:  Positive for rash.  Neurological:  Negative for weakness and numbness.     Objective:    Physical Exam Vitals reviewed.  Constitutional:      Appearance: Normal appearance.  Cardiovascular:     Rate and Rhythm: Normal rate and regular rhythm.  Musculoskeletal:     Comments: Straight leg raises are negative bilaterally No spinal tenderness. Mild right lower lumbar tenderness. Does have some mild tenderness over the right greater trochanteric bursa. Full range of motion right hip  Neurological:     Mental Status: She is alert.     Comments: 2+ reflexes knee and ankle bilaterally.  She has full strength with plantarflexion, dorsiflexion, and knee extension bilaterally.  Normal sensory function to touch.    BP 140/90 (BP Location: Left Arm, Patient Position: Sitting, Cuff Size: Normal)   Pulse 70   Temp 98.4 F (36.9 C) (Oral)   Wt 220 lb 4.8 oz (99.9 kg)   SpO2 99%   BMI 37.81 kg/m  Wt Readings from Last 3 Encounters:  12/12/20 220 lb 4.8 oz (99.9 kg)  02/09/20 222 lb 3.2 oz (100.8 kg)  08/24/19 221 lb (100.2 kg)     Health Maintenance Due  Topic Date Due   COVID-19 Vaccine (1) Never done   Pneumococcal Vaccine 50-60 Years old (1 - PCV) Never done   Hepatitis C Screening  Never done   PAP SMEAR-Modifier  01/02/2021    There are no preventive care reminders to display for this patient.  Lab Results  Component Value Date   TSH 0.81 03/09/2019   Lab Results  Component Value Date   WBC 5.0 08/24/2019   HGB 11.2 (L) 08/24/2019   HCT 35.8 (L) 08/24/2019   MCV 81.0 08/24/2019   PLT 447 (H) 08/24/2019   Lab Results  Component Value Date   NA 138 08/24/2019   K 4.7 08/24/2019   CO2 21 (L) 08/24/2019   GLUCOSE 106 (H) 08/24/2019   BUN 13 08/24/2019   CREATININE 0.81 08/24/2019   BILITOT 1.5 (H) 08/24/2019   ALKPHOS  46 08/24/2019   AST 19 08/24/2019   ALT 15 08/24/2019   PROT 6.3 (L) 08/24/2019   ALBUMIN 3.4 (L) 08/24/2019   CALCIUM 8.9 08/24/2019   ANIONGAP 10 08/24/2019   GFR 102.59 03/09/2019   Lab Results  Component Value Date   CHOL 172 03/09/2019   Lab Results  Component Value Date   HDL 41.00 03/09/2019   Lab Results  Component Value Date   LDLCALC 117 (H) 03/09/2019   Lab Results  Component Value  Date   TRIG 70.0 03/09/2019   Lab Results  Component Value Date   CHOLHDL 4 03/09/2019   No results found for: HGBA1C    Assessment & Plan:   #1 patient presents with 1 month history of right lower extremity pain.  Suspect right lumbar radiculitis.  Nonfocal neuro exam.  Prognosis uncertain at this time.  No prior imaging of lower back.  -Reviewed proper lifting -Reviewed extension stretches -Consider prednisone taper and reviewed potential side effects -Discussed other medications such as gabapentin but she is concerned because of her having to get up very early around 2 AM to get to work -If not improving over the next few weeks may need to consider MRI to further assess.  #2 history of recurrent tinea cruris -Refilled fluconazole which has helped her in the past.   Meds ordered this encounter  Medications   predniSONE (DELTASONE) 20 MG tablet    Sig: Taper as follows over 9 days.: 3-3-3-2-2-2-1-1-1    Dispense:  18 tablet    Refill:  0   fluconazole (DIFLUCAN) 100 MG tablet    Sig: Take 1 tablet (100 mg total) by mouth daily.    Dispense:  7 tablet    Refill:  0    Follow-up: No follow-ups on file.    Evelena Peat, MD

## 2021-01-03 ENCOUNTER — Ambulatory Visit (INDEPENDENT_AMBULATORY_CARE_PROVIDER_SITE_OTHER): Payer: BC Managed Care – PPO | Admitting: Family Medicine

## 2021-01-03 ENCOUNTER — Ambulatory Visit (HOSPITAL_BASED_OUTPATIENT_CLINIC_OR_DEPARTMENT_OTHER)
Admission: RE | Admit: 2021-01-03 | Discharge: 2021-01-03 | Disposition: A | Discharge status: Home / Self Care | Payer: BC Managed Care – PPO | Source: Ambulatory Visit | Attending: Family Medicine | Admitting: Family Medicine

## 2021-01-03 ENCOUNTER — Other Ambulatory Visit: Payer: Self-pay

## 2021-01-03 VITALS — BP 154/80 | HR 75 | Temp 98.4°F | Wt 218.7 lb

## 2021-01-03 DIAGNOSIS — M5416 Radiculopathy, lumbar region: Secondary | ICD-10-CM | POA: Diagnosis present

## 2021-01-03 NOTE — Progress Notes (Signed)
Established Patient Office Visit  Subjective:  Patient ID: Jamie Allison, female    DOB: 02/21/1976  Age: 45 y.o. MRN: [id number]  CC:  Chief Complaint  Patient presents with   Follow-up    Leg and shoulder pain, finished Rx, no improvement     HPI TASNIA SPEGAL presents for persistent pain right lower lumbar area radiating into the buttock and down the right lower extremity intermittently.  Refer to previous note for details.  Her pain started sometime back in June.  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  She states her pain remains 9 out of 10 at its worst.  She is also commuting each day from [place] which is probably not helping her persistent back pain.  Pain worse with changing position.  Achy quality.  Worse with prolonged sitting.  Denies any urine or stool incontinence.  No lower extremity numbness.  No lower extremity weakness.  No fevers or chills.  No appetite changes.  We recently prescribed some prednisone which did not help.  She also had taken some over-the-counter anti-inflammatories without relief.  She has tried some stretches which have not helped.  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   Headache(784.0)    Herpes    HYPERTENSION 10/20/2008   LUMBAR STRAIN, ACUTE 08/17/2009   Polyuria 02/15/2010   Sickle cell anemia (HCC)    trait    Past Surgical History:  Procedure Laterality Date   CESAREAN SECTION     X 3   TONSILLECTOMY AND ADENOIDECTOMY     TUBAL LIGATION      Family History  Problem Relation Age of Onset   Arthritis Other    Hypertension Other    Depression Other     Social History   Socioeconomic History   Marital status: Single    Spouse name: Not on file   Number of children: Not on file   Years of education: Not on file   Highest education level: Not on file  Occupational History    Not on file  Tobacco Use   Smoking status: Never   Smokeless tobacco: Never  Vaping Use   Vaping Use: Never used  Substance and Sexual Activity   Alcohol use: Yes   Drug use: No   Sexual activity: Not on file  Other Topics Concern   Not on file  Social History Narrative   Not on file   Social Determinants of Health   Financial Resource Strain: Not on file  Food Insecurity: Not on file  Transportation Needs: Not on file  Physical Activity: Not on file  Stress: Not on file  Social Connections: Not on file  Intimate Partner Violence: Not on file    Outpatient Medications Prior to Visit  Medication Sig Dispense Refill   Biotin 65465 MCG TABS Take by mouth.     fluconazole (DIFLUCAN) 100 MG tablet Take 1 tablet (100 mg total) by mouth daily. 7 tablet 0   metroNIDAZOLE (FLAGYL) 500 MG tablet Take 1 tablet (500 mg total) by mouth 2 (two) times daily. 14 tablet 0   multivitamin-iron-minerals-folic acid (CENTRUM) chewable tablet Chew 1 tablet by mouth daily. 30 tablet    predniSONE (DELTASONE) 20 MG tablet Taper as follows over 9 days.: 3-3-3-2-2-2-1-1-1 18 tablet 0   famotidine (PEPCID) 20 MG tablet Take 1 tablet (20 mg total)  by mouth 2 (two) times daily. 60 tablet 0   No facility-administered medications prior to visit.    No Known Allergies  ROS Review of Systems  Constitutional:  Negative for appetite change, chills, fever and unexpected weight change.  Respiratory:  Negative for shortness of breath.   Gastrointestinal:  Negative for abdominal pain.  Genitourinary:  Negative for dysuria and flank pain.  Musculoskeletal:  Positive for back pain.  Neurological:  Negative for weakness and numbness.     Objective:    Physical Exam Vitals reviewed.  Constitutional:      Appearance: Normal appearance.  Cardiovascular:     Rate and Rhythm: Normal rate and regular rhythm.  Musculoskeletal:     Comments: Right hip full range of motion.  No spinal tenderness.  Straight  leg raises are negative bilaterally.  Neurological:     Mental Status: She is alert.     Comments: He continues to have full strength lower extremities with plantarflexion, dorsiflexion, knee extension.  Deep tendon reflexes are 2+ knee and ankle bilaterally.    BP (!) 154/80 (BP Location: Left Arm, Patient Position: Sitting, Cuff Size: Normal)   Pulse 75   Temp 98.4 F (36.9 C) (Oral)   Wt 218 lb 11.2 oz (99.2 kg)   LMP 12/14/2020 (Approximate)   SpO2 99%   BMI 37.54 kg/m  Wt Readings from Last 3 Encounters:  01/03/21 218 lb 11.2 oz (99.2 kg)  12/12/20 220 lb 4.8 oz (99.9 kg)  02/09/20 222 lb 3.2 oz (100.8 kg)     Health Maintenance Due  Topic Date Due   COVID-19 Vaccine (1) Never done   Pneumococcal Vaccine 67-58 Years old (1 - PCV) Never done   Hepatitis C Screening  Never done   INFLUENZA VACCINE  12/18/2020   PAP SMEAR-Modifier  01/02/2021    There are no preventive care reminders to display for this patient.  Lab Results  Component Value Date   TSH 0.81 03/09/2019   Lab Results  Component Value Date   WBC 5.0 08/24/2019   HGB 11.2 (L) 08/24/2019   HCT 35.8 (L) 08/24/2019   MCV 81.0 08/24/2019   PLT 447 (H) 08/24/2019   Lab Results  Component Value Date   NA 138 08/24/2019   K 4.7 08/24/2019   CO2 21 (L) 08/24/2019   GLUCOSE 106 (H) 08/24/2019   BUN 13 08/24/2019   CREATININE 0.81 08/24/2019   BILITOT 1.5 (H) 08/24/2019   ALKPHOS 46 08/24/2019   AST 19 08/24/2019   ALT 15 08/24/2019   PROT 6.3 (L) 08/24/2019   ALBUMIN 3.4 (L) 08/24/2019   CALCIUM 8.9 08/24/2019   ANIONGAP 10 08/24/2019   GFR 102.59 03/09/2019   Lab Results  Component Value Date   CHOL 172 03/09/2019   Lab Results  Component Value Date   HDL 41.00 03/09/2019   Lab Results  Component Value Date   LDLCALC 117 (H) 03/09/2019   Lab Results  Component Value Date   TRIG 70.0 03/09/2019   Lab Results  Component Value Date   CHOLHDL 4 03/09/2019   No results found for:  HGBA1C    Assessment & Plan:   Problem List Items Addressed This Visit   None Visit Diagnoses     Right lumbar radiculitis    -  Primary   Relevant Orders   DG Lumbar Spine Complete     Pain unimproved with prednisone and stretches.  She also tried over-the-counter anti-inflammatory without improvement.  -Given duration  of symptoms of 2 months start with some plain lumbosacral films.  May need MRI lumbosacral spine to further assess.  Follow-up immediately for any urine or stool incontinence, progressive pain, or any focal neurologic symptoms such as weakness or progressive numbness. -Her job is fairly physical and she is looking at potential alternatives  No orders of the defined types were placed in this encounter.   Follow-up: No follow-ups on file.    Evelena Peat, MD

## 2021-01-05 ENCOUNTER — Other Ambulatory Visit: Payer: Self-pay

## 2021-01-05 DIAGNOSIS — M5416 Radiculopathy, lumbar region: Secondary | ICD-10-CM

## 2021-01-08 ENCOUNTER — Ambulatory Visit: Payer: BC Managed Care – PPO | Admitting: Family Medicine

## 2021-01-08 ENCOUNTER — Encounter: Payer: Self-pay | Admitting: Family Medicine

## 2021-01-08 VITALS — BP 164/90 | HR 79 | Temp 98.2°F | Wt 222.4 lb

## 2021-01-08 DIAGNOSIS — M5416 Radiculopathy, lumbar region: Secondary | ICD-10-CM

## 2021-01-08 NOTE — Patient Instructions (Signed)
I will set up MRI of lumbar spine to further assess

## 2021-01-08 NOTE — Progress Notes (Signed)
Established Patient Office Visit  Subjective:  Patient ID: Jamie Allison, female    DOB: March 25, 1976  Age: 45 y.o. MRN: [id number]  CC:  Chief Complaint  Patient presents with   Follow-up    Shoulder and leg pain. No improvement     HPI Jamie Allison presents for ongoing pain right buttock radiating down to her foot and ankle region on the right side.  Refer to previous notes for details.  She has achy pain which is at rest and with movement.  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  She also drives daily from [place] to [place].  We obtain recent plain films lumbar spine which showed no acute abnormality.  She basically brings in a thick packet of disability forms today.  She is looking apparently at permanent disability.  We explained that we do not do disability determinations per se.  She apparently already had some FMLA papers completed by another physician at another practice.  She tried to go back to work today and was told that they had no light duty for her.  She feels like she cannot continue to do the work she is doing at [employer].  Denies any new symptoms such as numbness, urine or stool incontinence, weakness.  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   Headache(784.0)    Herpes    HYPERTENSION 10/20/2008   LUMBAR STRAIN, ACUTE 08/17/2009   Polyuria 02/15/2010   Sickle cell anemia (HCC)    trait    Past Surgical History:  Procedure Laterality Date   CESAREAN SECTION     X 3   TONSILLECTOMY AND ADENOIDECTOMY     TUBAL LIGATION      Family History  Problem Relation Age of Onset   Arthritis Other    Hypertension Other    Depression Other     Social History   Socioeconomic History   Marital status: Single    Spouse name: Not on file   Number of children: Not on file   Years of education: Not on file    Highest education level: Not on file  Occupational History   Not on file  Tobacco Use   Smoking status: Never   Smokeless tobacco: Never  Vaping Use   Vaping Use: Never used  Substance and Sexual Activity   Alcohol use: Yes   Drug use: No   Sexual activity: Not on file  Other Topics Concern   Not on file  Social History Narrative   Not on file   Social Determinants of Health   Financial Resource Strain: Not on file  Food Insecurity: Not on file  Transportation Needs: Not on file  Physical Activity: Not on file  Stress: Not on file  Social Connections: Not on file  Intimate Partner Violence: Not on file    Outpatient Medications Prior to Visit  Medication Sig Dispense Refill   Biotin 76226 MCG TABS Take by mouth.     fluconazole (DIFLUCAN) 100 MG tablet Take 1 tablet (100 mg total) by mouth daily. 7 tablet 0   metroNIDAZOLE (FLAGYL) 500 MG tablet Take 1 tablet (500 mg total) by mouth 2 (two) times daily. 14 tablet 0   multivitamin-iron-minerals-folic acid (CENTRUM) chewable tablet Chew 1 tablet by mouth daily. 30 tablet    predniSONE (DELTASONE) 20 MG tablet Taper as follows over 9  days.: 3-3-3-2-2-2-1-1-1 18 tablet 0   famotidine (PEPCID) 20 MG tablet Take 1 tablet (20 mg total) by mouth 2 (two) times daily. 60 tablet 0   No facility-administered medications prior to visit.    No Known Allergies  ROS Review of Systems  Constitutional:  Negative for appetite change and fever.  Respiratory:  Negative for shortness of breath.   Cardiovascular:  Negative for chest pain.  Gastrointestinal:  Negative for abdominal pain.  Genitourinary:  Negative for dysuria.  Neurological:  Negative for weakness and numbness.     Objective:    Physical Exam Vitals reviewed.  Constitutional:      Appearance: Normal appearance.  Cardiovascular:     Comments: Feet are warm to touch with good distal pulses and excellent capillary refill. Musculoskeletal:     Comments: Straight leg  raises are negative bilaterally.  Neurological:     Mental Status: She is alert.     Comments: Continues to have good strength lower extremities with symmetric reflexes and normal sensory function throughout.    BP (!) 164/90 (BP Location: Left Arm, Patient Position: Sitting, Cuff Size: Normal)   Pulse 79   Temp 98.2 F (36.8 C) (Oral)   Wt 222 lb 6.4 oz (100.9 kg)   LMP 12/14/2020 (Approximate)   SpO2 98%   BMI 38.17 kg/m  Wt Readings from Last 3 Encounters:  01/08/21 222 lb 6.4 oz (100.9 kg)  01/03/21 218 lb 11.2 oz (99.2 kg)  12/12/20 220 lb 4.8 oz (99.9 kg)     Health Maintenance Due  Topic Date Due   COVID-19 Vaccine (1) Never done   Pneumococcal Vaccine 56-65 Years old (1 - PCV) Never done   Hepatitis C Screening  Never done   INFLUENZA VACCINE  12/18/2020   PAP SMEAR-Modifier  01/02/2021    There are no preventive care reminders to display for this patient.  Lab Results  Component Value Date   TSH 0.81 03/09/2019   Lab Results  Component Value Date   WBC 5.0 08/24/2019   HGB 11.2 (L) 08/24/2019   HCT 35.8 (L) 08/24/2019   MCV 81.0 08/24/2019   PLT 447 (H) 08/24/2019   Lab Results  Component Value Date   NA 138 08/24/2019   K 4.7 08/24/2019   CO2 21 (L) 08/24/2019   GLUCOSE 106 (H) 08/24/2019   BUN 13 08/24/2019   CREATININE 0.81 08/24/2019   BILITOT 1.5 (H) 08/24/2019   ALKPHOS 46 08/24/2019   AST 19 08/24/2019   ALT 15 08/24/2019   PROT 6.3 (L) 08/24/2019   ALBUMIN 3.4 (L) 08/24/2019   CALCIUM 8.9 08/24/2019   ANIONGAP 10 08/24/2019   GFR 102.59 03/09/2019   Lab Results  Component Value Date   CHOL 172 03/09/2019   Lab Results  Component Value Date   HDL 41.00 03/09/2019   Lab Results  Component Value Date   LDLCALC 117 (H) 03/09/2019   Lab Results  Component Value Date   TRIG 70.0 03/09/2019   Lab Results  Component Value Date   CHOLHDL 4 03/09/2019   No results found for: HGBA1C    Assessment & Plan:    Persistent right  lumbar radiculitis pains.  Pain not improved with medications.  Recent lumbar films unremarkable.  Neuro exam nonfocal.  -We had called earlier today to have her set up some physical therapy and that is in process. -We will try to go ahead and get MRI lumbar spine to further clarify -We did write to  keep her out of work another 2 weeks through September 5 -If she is looking at chronic long-term disability she will have to look at a provider who does that sort of comprehensive exam.   Follow-up: No follow-ups on file.    Evelena Peat, MD

## 2021-01-09 ENCOUNTER — Encounter: Payer: Self-pay | Admitting: Family Medicine

## 2021-01-09 ENCOUNTER — Telehealth: Payer: Self-pay | Admitting: Family Medicine

## 2021-01-09 NOTE — Telephone Encounter (Addendum)
Please advise. Have you seen this form? The folders up front have been emptied.

## 2021-01-09 NOTE — Telephone Encounter (Signed)
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 The paperwork will be placed in folder.  Patient is requesting to be called at [phone] when completed.  Please advise.

## 2021-01-10 NOTE — Telephone Encounter (Signed)
Noted. Those are the forms in question. Patient is already aware that Dr. Caryl Never does not complete those types of forms. Nothing further needed.

## 2021-01-13 ENCOUNTER — Ambulatory Visit (HOSPITAL_BASED_OUTPATIENT_CLINIC_OR_DEPARTMENT_OTHER)
Admission: RE | Admit: 2021-01-13 | Discharge: 2021-01-13 | Disposition: A | Discharge status: Home / Self Care | Payer: BC Managed Care – PPO | Source: Ambulatory Visit | Attending: Family Medicine | Admitting: Family Medicine

## 2021-01-13 ENCOUNTER — Other Ambulatory Visit: Payer: Self-pay

## 2021-01-13 DIAGNOSIS — M5416 Radiculopathy, lumbar region: Secondary | ICD-10-CM | POA: Diagnosis not present

## 2021-01-15 ENCOUNTER — Telehealth: Payer: Self-pay | Admitting: Family Medicine

## 2021-01-15 NOTE — Telephone Encounter (Signed)
See result notes. 

## 2021-01-15 NOTE — Telephone Encounter (Signed)
Patient was returning [place] phone call.  Patient can be contacted at [phone].  Please advise.

## 2021-01-18 ENCOUNTER — Encounter: Payer: Self-pay | Admitting: Physical Therapy

## 2021-01-18 ENCOUNTER — Other Ambulatory Visit: Payer: Self-pay

## 2021-01-18 ENCOUNTER — Ambulatory Visit: Payer: BC Managed Care – PPO | Attending: Family Medicine | Admitting: Physical Therapy

## 2021-01-18 DIAGNOSIS — M6281 Muscle weakness (generalized): Secondary | ICD-10-CM | POA: Insufficient documentation

## 2021-01-18 DIAGNOSIS — M25551 Pain in right hip: Secondary | ICD-10-CM | POA: Diagnosis present

## 2021-01-18 DIAGNOSIS — M62838 Other muscle spasm: Secondary | ICD-10-CM | POA: Insufficient documentation

## 2021-01-18 DIAGNOSIS — M25561 Pain in right knee: Secondary | ICD-10-CM | POA: Insufficient documentation

## 2021-01-18 NOTE — Therapy (Signed)
[facility] [address], [address] [place], [place], [address] Phone: [phone]   Fax:  [phone]  Physical Therapy Evaluation  Patient Details  Name: Jamie Allison MRN: [id number] Date of Birth: 1975/08/23 Referring Provider (PT): Evelena Peat, MD   Encounter Date: 01/18/2021   PT End of Session - 01/18/21 2240     Visit Number 1    Date for PT Re-Evaluation 03/15/21    Authorization Type BCBS PPO    Authorization Time Period 01/18/21 to 03/15/21    PT Start Time 1400    PT Stop Time 1444    PT Time Calculation (min) 44 min    Activity Tolerance No increased pain;Patient tolerated treatment well    Behavior During Therapy [facility] for tasks assessed/performed             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    Subjective Assessment - 01/18/21 1403     Subjective Pt states that she was skating back in April and fell onto her Rt hip. She didn't have any problems at the time, but a couple of months later, she started to have a problem with her Rt leg aching from the buttock/side down to the knee and side of the ankle. 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    Diagnostic tests XRAY unremarkable    Patient Stated Goals decrease aching    Currently in Pain? Yes    Pain Location --   Rt hip, knee, ankle   Pain Orientation Right;Lateral    Pain Descriptors / Indicators Aching;Dull    Pain  Type Acute pain;Chronic pain    Pain Radiating Towards Rt hip down lateral thigh to the anterior knee; lateral lower leg/ankle    Pain Onset More than a month ago    Pain Frequency Intermittent    Aggravating Factors  prolonged driving and keeping the knee flexed, working/housekeeping, sleeping on Rt side    Pain Relieving Factors straightening her knee when driving    Effect of Pain on Daily Activities limited work participation                [facility] PT Assessment - 01/18/21 0001       Assessment   Medical Diagnosis Rt lumbar radiculitis    Referring Provider (PT) Evelena Peat, MD    Onset Date/Surgical Date --   approx 2 months ago   Prior Therapy none      Precautions   Precautions None      Restrictions   Weight Bearing Restrictions No      Balance Screen   Has the patient fallen in the past 6 months No    Has the patient had a decrease in activity level because of a fear of falling?  No    Is the patient reluctant to leave their home because of a  fear of falling?  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; planning to reach out to MD regarding being out of work a little longer due to just beginning PT      Cognition   Overall Cognitive Status Within Functional Limits for tasks assessed      Sensation   Additional Comments pt noted tingling in the bottom of the foot/toes 1-2 times but not recently      Functional Tests   Functional tests Squat      Squat   Comments (+) pain Rt hip      ROM / Strength   AROM / PROM / Strength AROM;Strength;PROM      AROM   Overall AROM Comments active lumbar rotation Lt (+) Rt hip/thigh pain; all other lumbar ROM is pain free      PROM   Overall PROM Comments seated Rt hip IR: 10 deg, Rt hip ER: 20 deg      Strength   Overall Strength Comments Rt knee extension 4+/5    Strength Assessment Site Hip;Knee    Right/Left Hip  Left;Right    Right Hip Flexion 4/5    Right Hip Extension 3+/5    Right Hip ABduction 3+/5   (+) pain     Palpation   Palpation comment muscle tension and tenderness noted Rt gluteals/ITB/vastus lateralis/peroneals                        Objective measurements completed on examination: See above findings.       [facility] Adult PT Treatment/Exercise - 01/18/21 0001       Manual Therapy   Manual Therapy Joint mobilization    Manual therapy comments Pt reporting temporary relief in pain with mobilizations    Joint Mobilization Rt hip long axis distraction x2 bouts grade III, inferior mobilization grade III-IV x2 bouts                    PT Education - 01/18/21 2233     Education Details eval findings/POC; implemented 90/90 hip rotation stretch    Person(s) Educated Patient    Methods Explanation;Handout;Demonstration    Comprehension Verbalized understanding;Returned demonstration              PT Short Term Goals - 01/18/21 2257       PT SHORT TERM GOAL #1   Title Pt will be independent with her initial HEP to improve Rt LE ROM and strength.    Time 4    Period Weeks    Status New               PT Long Term Goals - 01/18/21 2258       PT LONG TERM GOAL #1   Title Pt will have greater than 100 deg of Rt hip flexion and 15 deg of hip IR to improve her squat technique for work/home mobility.    Time 8    Period Weeks    Status New    Target Date 03/15/21      PT LONG TERM GOAL #2   Title Pt will report atleast 50% improvement in her Rt LE pain from the start of PT.    Time 8    Period Weeks    Status New      PT LONG TERM GOAL #3   Title  Pt will have atleast 4/5 MMT strength of the Rt glutes.    Time 8    Period Weeks    Status New      PT LONG TERM GOAL #4   Title Pt will be able to demonstrate proper squat technique and other work related tasks with proper form to avoid reinjury.    Time 8    Period Weeks    Status New                     Plan - 01/18/21 2242     Clinical Impression Statement Pt is a pleasant 45 y.o F referred to OPPT with complaints of Rt LE pain onset insidiously a couple of months ago. 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 Pt has pain free lumbar ROM and denies back pain throughout the day, but her Rt hip pain is aggravated by sleeping on her Rt side and squatting. Her Rt knee is aggravated by squatting and prolonged driving and specifically with the knee flexed for long periods of time. Pt has significant tenderness in the Rt gluteal region, along the IT band and the lateral quadriceps. She tender along the medial quadriceps and the peroneals. In addition there is noted weakness of the hip and pain with resistance, and limited hip rotation to no greater than 90 deg flexion and 10 deg internal rotation. All of this indicates a possible hip pathology contributing to her pain and difficulty completing work specific tasks. Pt would benefit from skilled PT to address her mobility and strength deficits of the hip/knee and promote return to return and other daily activity without limitation. Pt would benefit from skilled PT 2x/week, however she is currently commuting from [place] and would like to try 1x/week with more focus on a HEP.    Personal Factors and Comorbidities Behavior Pattern;Fitness;Profession    Examination-Activity Limitations Lift;Bend;Squat;Stairs    Examination-Participation Restrictions Occupation;Driving    Stability/Clinical Decision Making Evolving/Moderate complexity    Clinical Decision Making Moderate    Rehab Potential Good    PT Frequency 1x / week    PT Duration 8 weeks    PT Treatment/Interventions ADLs/Self Care Home Management;Cryotherapy;Moist Heat;Therapeutic activities;Patient/family education;Neuromuscular  re-education;Therapeutic exercise;Manual techniques;Dry needling;Taping;Joint Manipulations    PT Next Visit Plan further special tests of the hip; implement hip strengthening for HEP; hip ROM    PT Home Exercise Plan needs new code; 90/90 hip rotation IR/ER    Consulted and Agree with Plan of Care Patient             Patient will benefit from skilled therapeutic intervention in order to improve the following deficits and impairments:  Pain, Improper body mechanics, Increased muscle spasms, Decreased mobility, Hypomobility, Decreased activity tolerance, Decreased range of motion, Decreased balance, Impaired flexibility  Visit Diagnosis: Pain in right hip  Right knee pain, unspecified chronicity  Muscle weakness (generalized)  Other muscle spasm     Problem List Patient Active Problem List   Diagnosis Date Noted   Sickle cell trait (HCC) 03/30/2018   Obesity (BMI 30-39.9) 04/16/2013   POLYURIA 02/15/2010   CHLAMYDIAL INFECTION 11/29/2009   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 [place], [place], [address] Phone: [phone]   Fax:  [phone]  Name: TARON CONREY MRN: [id number] Date of Birth: 30-Dec-1975

## 2021-01-23 ENCOUNTER — Ambulatory Visit: Payer: BC Managed Care – PPO | Admitting: Family Medicine

## 2021-01-23 ENCOUNTER — Other Ambulatory Visit: Payer: Self-pay

## 2021-01-23 ENCOUNTER — Encounter: Payer: Self-pay | Admitting: Family Medicine

## 2021-01-23 ENCOUNTER — Ambulatory Visit (HOSPITAL_BASED_OUTPATIENT_CLINIC_OR_DEPARTMENT_OTHER)
Admission: RE | Admit: 2021-01-23 | Discharge: 2021-01-23 | Disposition: A | Discharge status: Home / Self Care | Payer: BC Managed Care – PPO | Source: Ambulatory Visit | Attending: Family Medicine | Admitting: Family Medicine

## 2021-01-23 VITALS — BP 144/86 | HR 74 | Temp 98.9°F | Wt 222.3 lb

## 2021-01-23 DIAGNOSIS — M25551 Pain in right hip: Secondary | ICD-10-CM

## 2021-01-23 DIAGNOSIS — M545 Low back pain, unspecified: Secondary | ICD-10-CM | POA: Diagnosis not present

## 2021-01-23 NOTE — Progress Notes (Signed)
Established Patient Office Visit  Subjective:  Patient ID: Jamie DUDENHOEFFER, female    DOB: Oct 13, 1975  Age: 45 y.o. MRN: [id number]  CC:  Chief Complaint  Patient presents with   Follow-up    HPI Jamie Allison presents for follow-up regarding her back pain and right lower extremity pains.  Refer to prior note for details.  She was describing pains around the right hip region but radiating all the way down to the foot and ankle at times.  We obtained initially some lumbar spine films which were unremarkable.  MRI lumbar spine showed no clear explanation for her right-sided symptoms.  She did have some moderate bilateral facet hypertrophy L5-S1 and moderate right worse than left facet hypertrophy L4-5 but no nerve impingement and no significant stenosis.  No disc pathology.  We recommended physical therapy and at this point she has only had 1 session.  We recently completed some FMLA papers starting out of work August 22 and will extend through September 22.  She is concerned about hip pathology.  We recently had examined her hip and she had good range of motion.  There was low suspicion for significant osteoarthritis and no recent injury to suggest etiologies such as labrum tear.  Ambulating without difficulty.  Pain is mostly right lateral hip and does as above extend down to the foot and ankle at times.  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   Headache(784.0)    Herpes    HYPERTENSION 10/20/2008   LUMBAR STRAIN, ACUTE 08/17/2009   Polyuria 02/15/2010   Sickle cell anemia (HCC)    trait    Past Surgical History:  Procedure Laterality Date   CESAREAN SECTION     X 3   TONSILLECTOMY AND ADENOIDECTOMY     TUBAL LIGATION      Family History  Problem Relation Age of Onset   Arthritis Other    Hypertension Other    Depression Other     Social History   Socioeconomic History   Marital  status: Single    Spouse name: Not on file   Number of children: Not on file   Years of education: Not on file   Highest education level: Not on file  Occupational History   Not on file  Tobacco Use   Smoking status: Never   Smokeless tobacco: Never  Vaping Use   Vaping Use: Never used  Substance and Sexual Activity   Alcohol use: Yes   Drug use: No   Sexual activity: Not on file  Other Topics Concern   Not on file  Social History Narrative   Not on file   Social Determinants of Health   Financial Resource Strain: Not on file  Food Insecurity: Not on file  Transportation Needs: Not on file  Physical Activity: Not on file  Stress: Not on file  Social Connections: Not on file  Intimate Partner Violence: Not on file    Outpatient Medications Prior to Visit  Medication Sig Dispense Refill   Biotin 10258 MCG TABS Take by mouth.     fluconazole (DIFLUCAN) 100 MG tablet Take 1 tablet (100 mg total) by mouth daily. 7 tablet 0   metroNIDAZOLE (FLAGYL) 500 MG tablet Take 1 tablet (500 mg total) by mouth 2 (two) times daily. 14 tablet 0   multivitamin-iron-minerals-folic acid (CENTRUM) chewable tablet Chew 1 tablet by mouth daily. 30 tablet  famotidine (PEPCID) 20 MG tablet Take 1 tablet (20 mg total) by mouth 2 (two) times daily. 60 tablet 0   predniSONE (DELTASONE) 20 MG tablet Taper as follows over 9 days.: 3-3-3-2-2-2-1-1-1 (Patient not taking: Reported on 01/23/2021) 18 tablet 0   No facility-administered medications prior to visit.    No Known Allergies  ROS Review of Systems  Constitutional:  Negative for appetite change, chills, fever and unexpected weight change.  Neurological:  Negative for weakness and numbness.     Objective:    Physical Exam Vitals reviewed.  Constitutional:      Appearance: Normal appearance.  Cardiovascular:     Rate and Rhythm: Normal rate and regular rhythm.  Musculoskeletal:     Comments: Right hip reveals good range of motion  internally and externally.  No lateral tenderness. Straight leg raise remains negative bilaterally  Neurological:     Mental Status: She is alert.     Comments: Full strength lower extremities.    BP (!) 144/86 (BP Location: Left Arm, Patient Position: Sitting, Cuff Size: Large)   Pulse 74   Temp 98.9 F (37.2 C) (Oral)   Wt 222 lb 4.8 oz (100.8 kg)   SpO2 99%   BMI 38.16 kg/m  Wt Readings from Last 3 Encounters:  01/23/21 222 lb 4.8 oz (100.8 kg)  01/08/21 222 lb 6.4 oz (100.9 kg)  01/03/21 218 lb 11.2 oz (99.2 kg)     Health Maintenance Due  Topic Date Due   COVID-19 Vaccine (1) Never done   Pneumococcal Vaccine 55-80 Years old (1 - PCV) Never done   Hepatitis C Screening  Never done   INFLUENZA VACCINE  12/18/2020   PAP SMEAR-Modifier  01/02/2021    There are no preventive care reminders to display for this patient.  Lab Results  Component Value Date   TSH 0.81 03/09/2019   Lab Results  Component Value Date   WBC 5.0 08/24/2019   HGB 11.2 (L) 08/24/2019   HCT 35.8 (L) 08/24/2019   MCV 81.0 08/24/2019   PLT 447 (H) 08/24/2019   Lab Results  Component Value Date   NA 138 08/24/2019   K 4.7 08/24/2019   CO2 21 (L) 08/24/2019   GLUCOSE 106 (H) 08/24/2019   BUN 13 08/24/2019   CREATININE 0.81 08/24/2019   BILITOT 1.5 (H) 08/24/2019   ALKPHOS 46 08/24/2019   AST 19 08/24/2019   ALT 15 08/24/2019   PROT 6.3 (L) 08/24/2019   ALBUMIN 3.4 (L) 08/24/2019   CALCIUM 8.9 08/24/2019   ANIONGAP 10 08/24/2019   GFR 102.59 03/09/2019   Lab Results  Component Value Date   CHOL 172 03/09/2019   Lab Results  Component Value Date   HDL 41.00 03/09/2019   Lab Results  Component Value Date   LDLCALC 117 (H) 03/09/2019   Lab Results  Component Value Date   TRIG 70.0 03/09/2019   Lab Results  Component Value Date   CHOLHDL 4 03/09/2019   No results found for: HGBA1C    Assessment & Plan:   Problem List Items Addressed This Visit   None Visit Diagnoses      Right hip pain    -  Primary   Relevant Orders   DG Hip Unilat W OR W/O Pelvis 2-3 Views Right   Acute bilateral low back pain, unspecified whether sciatica present         -Recent MRI scan showed no significant pathology in the lumbosacral region.  Doubt significant hip  pathology but will get plain films right hip to be sure. -Continue physical therapy for now.  We have extended her work leave out through September 22 to give time for her therapy -If patient not improved with physical therapy through September 22 consider orthopedic referral and sooner if indicated by x-rays  No orders of the defined types were placed in this encounter.   Follow-up: No follow-ups on file.    Evelena Peat, MD

## 2021-01-23 NOTE — Patient Instructions (Signed)
Go for X-ray of right hip at [facility] facility.

## 2021-01-24 ENCOUNTER — Ambulatory Visit: Payer: BC Managed Care – PPO | Admitting: Physical Therapy

## 2021-01-24 ENCOUNTER — Other Ambulatory Visit: Payer: Self-pay

## 2021-01-24 ENCOUNTER — Encounter: Payer: Self-pay | Admitting: Physical Therapy

## 2021-01-24 DIAGNOSIS — M6281 Muscle weakness (generalized): Secondary | ICD-10-CM

## 2021-01-24 DIAGNOSIS — M25551 Pain in right hip: Secondary | ICD-10-CM

## 2021-01-24 DIAGNOSIS — M25561 Pain in right knee: Secondary | ICD-10-CM

## 2021-01-24 DIAGNOSIS — M62838 Other muscle spasm: Secondary | ICD-10-CM

## 2021-01-24 NOTE — Therapy (Signed)
[facility] [address] [place], [place], [address] Phone: [phone]   Fax:  [phone]  Physical Therapy Treatment  Patient Details  Name: Jamie Allison MRN: [id number] Date of Birth: [birth year] Referring Provider (PT): Evelena Peat, MD   Encounter Date: 01/24/2021   PT End of Session - 01/24/21 1111     Visit Number 2    Date for PT Re-Evaluation 03/15/21    Authorization Type BCBS PPO    Authorization Time Period 01/18/21 to 03/15/21    PT Start Time 1015    PT Stop Time 1100    PT Time Calculation (min) 45 min    Activity Tolerance Patient tolerated treatment well;Patient limited by pain    Behavior During Therapy [facility] for tasks assessed/performed             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   Subjective Assessment - 01/24/21 1017     Subjective Pt reports ongoing ache all the way down Rt lateral hip to ankle.  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    Diagnostic tests XRAY/MRI practically unremarkable    Patient Stated Goals decrease aching    Currently in Pain? Yes    Pain Score 5     Pain Location Hip    Pain Orientation Right;Lateral    Pain Descriptors / Indicators Aching    Pain Type Acute pain;Chronic pain    Pain Radiating Towards down lateral thigh to ankle    Pain Onset More than a month ago    Pain Frequency Intermittent    Aggravating Factors  driving, keeping knee flexed, work, sleep on Rt side    Pain Relieving Factors straightening  leg out                [facility] PT Assessment - 01/24/21 0001       Functional Tests   Functional tests Single leg stance      Single Leg Stance   Comments signif deviation and Trendelenburg on Rt      AROM   Overall AROM Comments Rt hip pain with hip flexion      PROM   Overall PROM Comments + neural tension Rt sciatic      Special Tests    Special Tests Hip Special Tests;Lumbar    Lumbar Tests Straight Leg Raise    Hip Special Tests  Luisa Hart (FABER) Test;SI Distraction      Straight Leg Raise   Findings Positive    Side  Right      Luisa Hart (FABER) Test   Findings Positive    Side Right      SI Distraction   Side Right    Comments relief                           [facility] Adult PT Treatment/Exercise - 01/24/21 0001  Exercises   Exercises Lumbar;Knee/Hip      Lumbar Exercises: Stretches   Pelvic Tilt 5 reps;5 seconds      Lumbar Exercises: Supine   Pelvic Tilt 5 reps;5 seconds      Lumbar Exercises: Quadruped   Madcat/Old Horse 5 reps    Madcat/Old Horse Limitations neutral to flexion using TA, avoid extension for now due to pain    Other Quadruped Lumbar Exercises gentle rocking  x 10 reps      Knee/Hip Exercises: Standing   Other Standing Knee Exercises counter weight shifting x 10 reps each: staggered Rt, staggered Lt, square stance, PT VC weight as tolerated into Rt LE with glut activation      Knee/Hip Exercises: Supine   Heel Slides Right;5 reps    Heel Slides Limitations anterior Rt hip pain    Knee Flexion Limitations march from hooklying green loop band x 6 reps alternating with TA contraction    Other Supine Knee/Hip Exercises hooklying ball squeeze with pelvic tilt 5x5"    Other Supine Knee/Hip Exercises green loop band hip ER 5x5" holds in pelvic tilt                  Upper Extremity Functional Index Score :   /80     PT Short Term Goals - 01/24/21 1111       PT SHORT TERM GOAL #1   Title Pt will be  independent with her initial HEP to improve Rt LE ROM and strength.    Status On-going               PT Long Term Goals - 01/18/21 2258       PT LONG TERM GOAL #1   Title Pt will have greater than 100 deg of Rt hip flexion and 15 deg of hip IR to improve her squat technique for work/home mobility.    Time 8    Period Weeks    Status New    Target Date 03/15/21      PT LONG TERM GOAL #2   Title Pt will report atleast 50% improvement in her Rt LE pain from the start of PT.    Time 8    Period Weeks    Status New      PT LONG TERM GOAL #3   Title Pt will have atleast 4/5 MMT strength of the Rt glutes.    Time 8    Period Weeks    Status New      PT LONG TERM GOAL #4   Title Pt will be able to demonstrate proper squat technique and other work related tasks with proper form to avoid reinjury.    Time 8    Period Weeks    Status New                   Plan - 01/24/21 1104     Clinical Impression Statement Pt arrived with ongoing Rt hip aching from lateral hip to ankle.  This is her first follow up since initial evaluation.  She has signif weakness in Rt hip and knee causing deviation and + Trendelenburg with SLS on Rt and with antalgic gait.  She has pain on activation of hip flexors and knee extensors.  Rt hip ROM was more tolerated from quadruped (rocking) than supine hooklying today.  PT initiated gentle hip ther ex for submax activation and ROM with good tolerance.  Ongoing assessment throughout session to determine appropriateness of  selected ther ex for Pt's pain level.  Pt has pain in WB on Rt LE with poor gluteal activation so counter hip weight shifts given for pre-gait activities.  Continue along POC with ongoing assessment.    PT Frequency 1x / week    PT Duration 8 weeks    PT Next Visit Plan review HEP, try SAQ, progress core, Rt hip and knee strength as tol, modalities for pain    PT Home Exercise Plan Access Code: [email or web address]    Consulted and Agree with  Plan of Care Patient             Patient will benefit from skilled therapeutic intervention in order to improve the following deficits and impairments:     Visit Diagnosis: Pain in right hip  Right knee pain, unspecified chronicity  Muscle weakness (generalized)  Other muscle spasm     Problem List Patient Active Problem List   Diagnosis Date Noted   Sickle cell trait (HCC) 03/30/2018   Obesity (BMI 30-39.9) 04/16/2013   POLYURIA 02/15/2010   CHLAMYDIAL INFECTION 11/29/2009   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, [address] [place], [place], [address] Phone: [phone]   Fax:  [phone]  Name: Jamie Allison MRN: [id number] Date of Birth: Feb 01, 1976

## 2021-01-24 NOTE — Patient Instructions (Signed)
Access Code: [email or web address] URL: [email or web address] Date: 01/24/2021 Prepared by: Loistine Simas   Exercises Supine Posterior Pelvic Tilt - 1 x daily - 7 x weekly - 2 sets - 10 reps Supine Hip Adduction Isometric with Ball - 1 x daily - 7 x weekly - 1 sets - 10 reps - 5 hold Hooklying Clamshell with Resistance - 1 x daily - 7 x weekly - 1 sets - 10 reps - 5 hold Quadruped Rocking Slow - 1 x daily - 7 x weekly - 1 sets - 10 reps Cat Cow - 1 x daily - 7 x weekly - 1 sets - 10 reps Side to Side Weight Shift with Counter Support - 1 x daily - 7 x weekly - 1 sets - 10 reps - 3 hold Staggered Stance Forward Backward Weight Shift with Counter Support - 1 x daily - 7 x weekly - 1 sets - 10 reps - 3 hold

## 2021-01-25 ENCOUNTER — Telehealth: Payer: Self-pay

## 2021-01-25 NOTE — Telephone Encounter (Signed)
Patient called asking if their is an Rx that can be prescribed pt stated she is in a lot of pain from the arthritis

## 2021-01-25 NOTE — Telephone Encounter (Signed)
Last office visit 01/23/21

## 2021-01-25 NOTE — Telephone Encounter (Signed)
Spoke with patient informed of message

## 2021-02-02 ENCOUNTER — Ambulatory Visit: Payer: BC Managed Care – PPO | Admitting: Physical Therapy

## 2021-02-02 ENCOUNTER — Encounter: Payer: Self-pay | Admitting: Physical Therapy

## 2021-02-02 ENCOUNTER — Other Ambulatory Visit: Payer: Self-pay

## 2021-02-02 DIAGNOSIS — M25551 Pain in right hip: Secondary | ICD-10-CM

## 2021-02-02 DIAGNOSIS — M62838 Other muscle spasm: Secondary | ICD-10-CM

## 2021-02-02 DIAGNOSIS — M25561 Pain in right knee: Secondary | ICD-10-CM

## 2021-02-02 DIAGNOSIS — M6281 Muscle weakness (generalized): Secondary | ICD-10-CM

## 2021-02-02 NOTE — Therapy (Signed)
[facility] [address], [address] [place], [place], [address] Phone: [phone]   Fax:  [phone]  Physical Therapy Treatment  Patient Details  Name: Jamie Allison MRN: [id number] Date of Birth: Mar 16, 1976 Referring Provider (PT): Evelena Peat, MD   Encounter Date: 02/02/2021   PT End of Session - 02/02/21 0927     Visit Number 3    Date for PT Re-Evaluation 03/15/21    Authorization Type BCBS PPO    Authorization Time Period 01/18/21 to 03/15/21    PT Start Time 0846    PT Stop Time 0935    PT Time Calculation (min) 49 min    Activity Tolerance Patient tolerated treatment well;Patient limited by pain    Behavior During Therapy [facility] for tasks assessed/performed             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   Subjective Assessment - 02/02/21 0855     Subjective Pt continues to have high levels of pain in Rt hip and LE but ache does not extend to foot/ankle anymore.  Most pain is in lateral hip and thigh and medial thigh.    Diagnostic tests XRAY/MRI practically unremarkable    Patient Stated Goals decrease aching    Currently in Pain? Yes    Pain Score 3     Pain Location Hip    Pain Orientation Right;Lateral    Pain Descriptors / Indicators Aching    Pain Type Acute pain;Chronic pain    Pain Onset More than a month ago    Pain Frequency Intermittent    Aggravating Factors  sitting, sleep on Rt side                               [facility] Adult PT  Treatment/Exercise - 02/02/21 0001       Neuro Re-ed    Neuro Re-ed Details  Lt SL 5x10 sec TA indraw, Pt self-palpation of abdominal wall for feedback, Pt with signif fatigue after last rep      Exercises   Exercises Lumbar;Knee/Hip      Lumbar Exercises: Stretches   Single Knee to Chest Stretch Left;1 rep;30 seconds    Single Knee to Chest Stretch Limitations with Rt leg straight on mat table for Rt hip flexor stretch    Hip Flexor Stretch 2 reps;30 seconds    Hip Flexor Stretch Limitations Lt foot on 2nd step    Quad Stretch Right;2 reps;20 seconds    Quad Stretch Limitations passive stretch by PT in Lt SL, Pt reported signif pain/discomfort with this, lacks 35 deg    Other Lumbar Stretch Exercise seated blue ball rollouts      Lumbar Exercises: Quadruped   Madcat/Old Horse 5 reps    Madcat/Old Horse Limitations neutral to flexion using TA, avoid extension for now due to pain    Other Quadruped Lumbar Exercises gentle rocking into  prayer stretch, walk hands into SB bil 3x5" each      Knee/Hip Exercises: Supine   Other Supine Knee/Hip Exercises whole leg press Rt LE 5x5" holds, very fatigued and increased pain      Modalities   Modalities Moist Heat      Moist Heat Therapy   Number Minutes Moist Heat 10 Minutes    Moist Heat Location Hip   Rt     Manual Therapy   Manual Therapy Soft tissue mobilization    Soft tissue mobilization attempted Addaday assisted STM to Rt lateral thigh and posterior hip but Pt with too much pain                     PT Education - 02/02/21 0924     Education Details ADDED hip flexor stretch on stairs and options for lumbar stretching    Person(s) Educated Patient    Methods Explanation;Demonstration;Handout    Comprehension Verbalized understanding              PT Short Term Goals - 01/24/21 1111       PT SHORT TERM GOAL #1   Title Pt will be independent with her initial HEP to improve Rt LE ROM and strength.    Status  On-going               PT Long Term Goals - 01/18/21 2258       PT LONG TERM GOAL #1   Title Pt will have greater than 100 deg of Rt hip flexion and 15 deg of hip IR to improve her squat technique for work/home mobility.    Time 8    Period Weeks    Status New    Target Date 03/15/21      PT LONG TERM GOAL #2   Title Pt will report atleast 50% improvement in her Rt LE pain from the start of PT.    Time 8    Period Weeks    Status New      PT LONG TERM GOAL #3   Title Pt will have atleast 4/5 MMT strength of the Rt glutes.    Time 8    Period Weeks    Status New      PT LONG TERM GOAL #4   Title Pt will be able to demonstrate proper squat technique and other work related tasks with proper form to avoid reinjury.    Time 8    Period Weeks    Status New                   Plan - 02/02/21 9242     Clinical Impression Statement Pt reports ongoing signif pain in Rt hip.  Pain does not extend as far down Rt LE now - only to lateral and medial thigh.  Pt appeared to be WB through Rt LE with less antalgia this morning than at previous visit and arrived with 3/10 pain.  She has signif flexibility restrictions in Rt quad and hip flexor as well as lumbar paraspinals and Rt QL.  Session spent finding appropriate stretches for HEP which were updated today.  Pt is quick to fatigue or find pain level increase with all ther ex today.  She continues to have pain with sub-max activation of all hip cardinal plane motions.  She is weak in abdominals and Rt hip abductors and ERs.  Heat used on Rt hip in Lt SL end of session for  increased pain to 6/10 end of session.  Addaday assisted STM not tolerated due to sensitivity along hip and thigh.  Continue with ongoing assessment of response to treatment.  Pt plans to see MD regarding return to work status scheduled for late next week and Pt does not feel she will be able to return yet due to ongoing pain (Pt is a housekeeper).    PT Frequency  1x / week    PT Duration 8 weeks    PT Treatment/Interventions ADLs/Self Care Home Management;Cryotherapy;Moist Heat;Therapeutic activities;Patient/family education;Neuromuscular re-education;Therapeutic exercise;Manual techniques;Dry needling;Taping;Joint Manipulations    PT Next Visit Plan review new stretches from last visit (are they helping)?, consider stim and heat combo if still in high pain, submax Rt hip and core ther ex as tol    PT Home Exercise Plan Access Code: [email or web address]    Consulted and Agree with Plan of Care Patient             Patient will benefit from skilled therapeutic intervention in order to improve the following deficits and impairments:     Visit Diagnosis: Pain in right hip  Right knee pain, unspecified chronicity  Muscle weakness (generalized)  Other muscle spasm     Problem List Patient Active Problem List   Diagnosis Date Noted   Sickle cell trait (HCC) 03/30/2018   Obesity (BMI 30-39.9) 04/16/2013   POLYURIA 02/15/2010   CHLAMYDIAL INFECTION 11/29/2009   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 [place], [place], [address] Phone: [phone]   Fax:  [phone]  Name: Jamie Allison MRN: [id number] Date of Birth: 1976-02-14

## 2021-02-02 NOTE — Patient Instructions (Signed)
Access Code: [email or web address] URL: [email or web address] Date: 02/02/2021 Prepared by: Loistine Simas   Exercises Supine Posterior Pelvic Tilt - 1 x daily - 7 x weekly - 2 sets - 10 reps Supine Hip Adduction Isometric with Ball - 1 x daily - 7 x weekly - 1 sets - 10 reps - 5 hold Hooklying Clamshell with Resistance - 1 x daily - 7 x weekly - 1 sets - 10 reps - 5 hold Quadruped Rocking Slow - 1 x daily - 7 x weekly - 1 sets - 10 reps Cat Cow - 1 x daily - 7 x weekly - 1 sets - 10 reps Side to Side Weight Shift with Counter Support - 1 x daily - 7 x weekly - 1 sets - 10 reps - 3 hold Staggered Stance Forward Backward Weight Shift with Counter Support - 1 x daily - 7 x weekly - 1 sets - 10 reps - 3 hold Hip Flexor Stretch with Chair - 1 x daily - 7 x weekly - 1 sets - 2 reps - 20 hold Seated Flexion Stretch with Swiss Ball - 1 x daily - 7 x weekly - 3 sets - 10 reps Seated Thoracic Flexion and Rotation with Swiss Ball - 1 x daily - 7 x weekly - 3 sets - 10 reps Child's Pose with Sidebending - 1 x daily - 7 x weekly - 1 sets - 5 reps - 5 hold

## 2021-02-05 ENCOUNTER — Other Ambulatory Visit: Payer: Self-pay

## 2021-02-06 ENCOUNTER — Encounter: Payer: Self-pay | Admitting: Family Medicine

## 2021-02-06 ENCOUNTER — Ambulatory Visit: Payer: BC Managed Care – PPO | Admitting: Family Medicine

## 2021-02-06 VITALS — BP 160/80 | HR 103 | Temp 98.3°F | Wt 223.5 lb

## 2021-02-06 DIAGNOSIS — I10 Essential (primary) hypertension: Secondary | ICD-10-CM | POA: Diagnosis not present

## 2021-02-06 DIAGNOSIS — M25551 Pain in right hip: Secondary | ICD-10-CM

## 2021-02-06 DIAGNOSIS — M545 Low back pain, unspecified: Secondary | ICD-10-CM

## 2021-02-06 MED ORDER — AMLODIPINE BESYLATE 5 MG PO TABS: 5.0000 mg | ORAL_TABLET | Freq: Every day | ORAL | 5 refills | Status: DC

## 2021-02-06 NOTE — Progress Notes (Signed)
Established Patient Office Visit  Subjective:  Patient ID: Jamie Allison, female    DOB: February 12, 1976  Age: 45 y.o. MRN: [id number]  CC:  Chief Complaint  Patient presents with   Leg Pain    HPI Jamie Allison presents for her ongoing right low back pain and pain right hip region.  Refer to multiple recent notes for details.  MRI lumbar spine did not show any acute abnormalities to explain any radiculitis symptoms.  At 1 point she had pain radiating from the back all the way down to the foot.  Plain films right hip revealed only mild osteoarthritis changes.  She is currently undergoing physical therapy and feels this may be helping somewhat.  Her job is with the [employer] and she helps with cleaning and is fairly physical.  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  She does not feel capable of going back at this time to her regular duty.  We initially had written to keep her out through the 22nd.  She plans to continue physical therapy through the end of October.  She brings in work forms today for disability eligibility review.  Her recent pains in the right lower leg have basically resolved.  Her pain now is mostly hip and thigh region.  Did have a fall skating months ago but pain in the hip did not start immediately after that.  He does have history of hypertension and currently not on any medications for that.  She has had several recent elevated readings.  No recent headaches.  No regular alcohol use.  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   Headache(784.0)    Herpes    HYPERTENSION 10/20/2008   LUMBAR STRAIN, ACUTE 08/17/2009   Polyuria 02/15/2010   Sickle cell anemia (HCC)    trait    Past Surgical History:  Procedure Laterality Date   CESAREAN SECTION     X 3   TONSILLECTOMY AND ADENOIDECTOMY     TUBAL LIGATION       Family History  Problem Relation Age of Onset   Arthritis Other    Hypertension Other    Depression Other     Social History   Socioeconomic History   Marital status: Single    Spouse name: Not on file   Number of children: Not on file   Years of education: Not on file   Highest education level: Not on file  Occupational History   Not on file  Tobacco Use   Smoking status: Never   Smokeless tobacco: Never  Vaping Use   Vaping Use: Never used  Substance and Sexual Activity   Alcohol use: Yes   Drug use: No   Sexual activity: Not on file  Other Topics Concern   Not on file  Social History Narrative   Not on file   Social Determinants of Health   Financial Resource Strain: Not on file  Food Insecurity: Not on file  Transportation Needs: Not on file  Physical Activity: Not on file  Stress: Not on file  Social Connections: Not on file  Intimate Partner Violence: Not on file    Outpatient Medications Prior to Visit  Medication Sig Dispense Refill   Biotin 09381 MCG TABS Take by mouth.     famotidine (PEPCID) 20 MG tablet Take 1 tablet (20 mg total) by mouth 2 (  two) times daily. 60 tablet 0   fluconazole (DIFLUCAN) 100 MG tablet Take 1 tablet (100 mg total) by mouth daily. 7 tablet 0   metroNIDAZOLE (FLAGYL) 500 MG tablet Take 1 tablet (500 mg total) by mouth 2 (two) times daily. 14 tablet 0   multivitamin-iron-minerals-folic acid (CENTRUM) chewable tablet Chew 1 tablet by mouth daily. 30 tablet    predniSONE (DELTASONE) 20 MG tablet Taper as follows over 9 days.: 3-3-3-2-2-2-1-1-1 (Patient not taking: Reported on 01/23/2021) 18 tablet 0   No facility-administered medications prior to visit.    No Known Allergies  ROS Review of Systems  Constitutional:  Negative for appetite change, chills and fever.  Musculoskeletal:  Positive for back pain.  Neurological:  Negative for weakness and numbness.     Objective:    Physical Exam Vitals reviewed.   Constitutional:      Appearance: Normal appearance.  Cardiovascular:     Rate and Rhythm: Normal rate and regular rhythm.  Musculoskeletal:     Right lower leg: No edema.     Left lower leg: No edema.     Comments: Right leg raises negative.  Neurological:     Mental Status: She is alert.     Comments: Symmetric reflexes lower extremities and full strength throughout    BP (!) 160/80 (BP Location: Left Arm, Cuff Size: Large)   Pulse (!) 103   Temp 98.3 F (36.8 C) (Oral)   Wt 223 lb 8 oz (101.4 kg)   SpO2 99%   BMI 38.36 kg/m  Wt Readings from Last 3 Encounters:  02/06/21 223 lb 8 oz (101.4 kg)  01/23/21 222 lb 4.8 oz (100.8 kg)  01/08/21 222 lb 6.4 oz (100.9 kg)     Health Maintenance Due  Topic Date Due   COVID-19 Vaccine (1) Never done   Hepatitis C Screening  Never done   INFLUENZA VACCINE  12/18/2020   PAP SMEAR-Modifier  01/02/2021    There are no preventive care reminders to display for this patient.  Lab Results  Component Value Date   TSH 0.81 03/09/2019   Lab Results  Component Value Date   WBC 5.0 08/24/2019   HGB 11.2 (L) 08/24/2019   HCT 35.8 (L) 08/24/2019   MCV 81.0 08/24/2019   PLT 447 (H) 08/24/2019   Lab Results  Component Value Date   NA 138 08/24/2019   K 4.7 08/24/2019   CO2 21 (L) 08/24/2019   GLUCOSE 106 (H) 08/24/2019   BUN 13 08/24/2019   CREATININE 0.81 08/24/2019   BILITOT 1.5 (H) 08/24/2019   ALKPHOS 46 08/24/2019   AST 19 08/24/2019   ALT 15 08/24/2019   PROT 6.3 (L) 08/24/2019   ALBUMIN 3.4 (L) 08/24/2019   CALCIUM 8.9 08/24/2019   ANIONGAP 10 08/24/2019   GFR 102.59 03/09/2019   Lab Results  Component Value Date   CHOL 172 03/09/2019   Lab Results  Component Value Date   HDL 41.00 03/09/2019   Lab Results  Component Value Date   LDLCALC 117 (H) 03/09/2019   Lab Results  Component Value Date   TRIG 70.0 03/09/2019   Lab Results  Component Value Date   CHOLHDL 4 03/09/2019   No results found for:  HGBA1C    Assessment & Plan:   #1 right lower back pain in the lumbar region with radiation toward the hip and right lower extremity down the thigh.  Recent MRI lumbar spine did not show any clear etiology for her pain.  -  Continue physical therapy -We will complete her papers for light duty. -If not improving further with physical therapy in the next couple weeks consider referral to orthopedics  #2 hypertension currently not treated.  Multiple elevated readings recently and considerably higher reading today  -Discussed nonpharmacologic management -Start amlodipine 5 mg daily and reassess blood pressure within 1 month -She is strongly encouraged to try to lose some weight  We spent over 30 minutes addressing issues above and completing her light duty papers.   Meds ordered this encounter  Medications   amLODipine (NORVASC) 5 MG tablet    Sig: Take 1 tablet (5 mg total) by mouth daily.    Dispense:  30 tablet    Refill:  5    Follow-up: No follow-ups on file.    Evelena Peat, MD

## 2021-02-09 ENCOUNTER — Encounter: Payer: BC Managed Care – PPO | Admitting: Physical Therapy

## 2021-02-15 ENCOUNTER — Ambulatory Visit: Payer: BC Managed Care – PPO | Admitting: Physical Therapy

## 2021-02-15 ENCOUNTER — Other Ambulatory Visit: Payer: Self-pay

## 2021-02-15 DIAGNOSIS — M6281 Muscle weakness (generalized): Secondary | ICD-10-CM

## 2021-02-15 DIAGNOSIS — M25551 Pain in right hip: Secondary | ICD-10-CM | POA: Diagnosis not present

## 2021-02-15 DIAGNOSIS — M62838 Other muscle spasm: Secondary | ICD-10-CM

## 2021-02-15 DIAGNOSIS — M25561 Pain in right knee: Secondary | ICD-10-CM

## 2021-02-15 NOTE — Therapy (Signed)
[facility] [address] [place], [place], [address] Phone: [phone]   Fax:  [phone]  Physical Therapy Treatment  Patient Details  Name: Jamie Allison MRN: [id number] Date of Birth: 02/13/1976 Referring Provider (PT): Evelena Peat, MD   Encounter Date: 02/15/2021   PT End of Session - 02/15/21 1626     Visit Number 4    Date for PT Re-Evaluation 03/15/21    Authorization Type BCBS PPO    Authorization Time Period 01/18/21 to 03/15/21    PT Start Time 1230    PT Stop Time 1315    PT Time Calculation (min) 45 min    Activity Tolerance Patient tolerated treatment well             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   Subjective Assessment - 02/15/21 1230     Subjective I can't go to back to work. Front thigh pain 8/10.  I think it's b/c I'm doing too much.  I think it's the driving.  Stretching helps.  Using the band and ball is OK too.  PT last time was perfect.    Currently in Pain? Yes    Pain Score 8     Pain Orientation Right    Aggravating Factors  driving; steps; work                               [facility] Adult PT Treatment/Exercise - 02/15/21 0001       Knee/Hip Exercises: Stretches   Other Knee/Hip Stretches doorway stretch: rocking in/out, UE slide; UE reach up and over for hip flexor, ITB and QL stretch 3x5      Knee/Hip Exercises: Standing   Other Standing Knee Exercises glute med "stand tall" in kickstand position 8x right/left      Moist  Heat Therapy   Number Minutes Moist Heat 3 Minutes    Moist Heat Location Hip   Rt     Manual Therapy   Soft tissue mobilization right glute, lateral quads              Trigger Point Dry Needling - 02/15/21 0001     Consent Given? Yes    Education Handout Provided Yes    Muscles Treated Lower Quadrant Vastus lateralis    Vastus lateralis Response Palpable increased muscle length;Twitch response elicited    Gluteus Maximus Response Palpable increased muscle length    Tensor Fascia Lata Response Palpable increased muscle length                   PT Education - 02/15/21 1626     Education Details DN after care; doorway stretch; glute med "stand tall" muscle activation    Person(s) Educated Patient    Methods Explanation;Demonstration;Handout    Comprehension Returned demonstration;Verbalized understanding              PT Short  Term Goals - 01/24/21 1111       PT SHORT TERM GOAL #1   Title Pt will be independent with her initial HEP to improve Rt LE ROM and strength.    Status On-going               PT Long Term Goals - 01/18/21 2258       PT LONG TERM GOAL #1   Title Pt will have greater than 100 deg of Rt hip flexion and 15 deg of hip IR to improve her squat technique for work/home mobility.    Time 8    Period Weeks    Status New    Target Date 03/15/21      PT LONG TERM GOAL #2   Title Pt will report atleast 50% improvement in her Rt LE pain from the start of PT.    Time 8    Period Weeks    Status New      PT LONG TERM GOAL #3   Title Pt will have atleast 4/5 MMT strength of the Rt glutes.    Time 8    Period Weeks    Status New      PT LONG TERM GOAL #4   Title Pt will be able to demonstrate proper squat technique and other work related tasks with proper form to avoid reinjury.    Time 8    Period Weeks    Status New                   Plan - 02/15/21 1234     Clinical Impression Statement The patient reports PT has  been helping but rates her pain at a continually high level, 8/10 today.  She has multiple tender points in right quads and gluteals and is receptive to a trial of DN with manual therapy to address these myofasical issues.  Improved soft tissue mobility particularly in quads noted following.  She also has a positive response to lengthening/mobility ex's for QL, hip flexors and ITB as well as neuromuscular activation of glute medius muscles.  Therapist monitoring response throughout treatment session.    Rehab Potential Good    PT Frequency 1x / week    PT Duration 8 weeks    PT Treatment/Interventions ADLs/Self Care Home Management;Cryotherapy;Moist Heat;Therapeutic activities;Patient/family education;Neuromuscular re-education;Therapeutic exercise;Manual techniques;Dry needling;Taping;Joint Manipulations    PT Next Visit Plan assess response to DN #1;  right trunk and LE lengthening ex's; strengthening of lumbo/pelvic/hip muscles right submax    PT Home Exercise Plan Access Code: [email or web address]             Patient will benefit from skilled therapeutic intervention in order to improve the following deficits and impairments:  Pain, Improper body mechanics, Increased muscle spasms, Decreased mobility, Hypomobility, Decreased activity tolerance, Decreased range of motion, Decreased balance, Impaired flexibility  Visit Diagnosis: Pain in right hip  Right knee pain, unspecified chronicity  Muscle weakness (generalized)  Other muscle spasm     Problem List Patient Active Problem List   Diagnosis Date Noted   Sickle cell trait (HCC) 03/30/2018   Obesity (BMI 30-39.9) 04/16/2013   POLYURIA 02/15/2010   CHLAMYDIAL INFECTION 11/29/2009   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 [place], [place], [address] Phone: [phone]   Fax:  [phone]  Name: YANELI KEITHLEY MRN: [id number] Date of Birth: 12/30/1975

## 2021-02-15 NOTE — Patient Instructions (Signed)
Trigger Point Dry Needling  What is Trigger Point Dry Needling (DN)? DN is a physical therapy technique used to treat muscle pain and dysfunction. Specifically, DN helps deactivate muscle trigger points (muscle knots).  A thin filiform needle is used to penetrate the skin and stimulate the underlying trigger point. The goal is for a local twitch response (LTR) to occur and for the trigger point to relax. No medication of any kind is injected during the procedure.   What Does Trigger Point Dry Needling Feel Like?  The procedure feels different for each individual patient. Some patients report that they do not actually feel the needle enter the skin and overall the process is not painful. Very mild bleeding may occur. However, many patients feel a deep cramping in the muscle in which the needle was inserted. This is the local twitch response.   How Will I feel after the treatment? Soreness is normal, and the onset of soreness may not occur for a few hours. Typically this soreness does not last longer than two days.  Bruising is uncommon, however; ice can be used to decrease any possible bruising.  In rare cases feeling tired or nauseous after the treatment is normal. In addition, your symptoms may get worse before they get better, this period will typically not last longer than 24 hours.   What Can I do After My Treatment? Increase your hydration by drinking more water for the next 24 hours. You may place ice or heat on the areas treated that have become sore, however, do not use heat on inflamed or bruised areas. Heat often brings more relief post needling. You can continue your regular activities, but vigorous activity is not recommended initially after the treatment for 24 hours. DN is best combined with other physical therapy such as strengthening, stretching, and other therapies.    Access Code: [email or web address] URL: [email or web address] Date: 02/15/2021 Prepared by: Lavinia Sharps  Exercises Supine Posterior Pelvic Tilt - 1 x daily - 7 x weekly - 2 sets - 10 reps Supine Hip Adduction Isometric with Ball - 1 x daily - 7 x weekly - 1 sets - 10 reps - 5 hold Hooklying Clamshell with Resistance - 1 x daily - 7 x weekly - 1 sets - 10 reps - 5 hold Quadruped Rocking Slow - 1 x daily - 7 x weekly - 1 sets - 10 reps Cat Cow - 1 x daily - 7 x weekly - 1 sets - 10 reps Side to Side Weight Shift with Counter Support - 1 x daily - 7 x weekly - 1 sets - 10 reps - 3 hold Staggered Stance Forward Backward Weight Shift with Counter Support - 1 x daily - 7 x weekly - 1 sets - 10 reps - 3 hold Hip Flexor Stretch with Chair - 1 x daily - 7 x weekly - 1 sets - 2 reps - 20 hold Seated Flexion Stretch with Swiss Ball - 1 x daily - 7 x weekly - 3 sets - 10 reps Seated Thoracic Flexion and Rotation with Swiss Ball - 1 x daily - 7 x weekly - 3 sets - 10 reps Child's Pose with Sidebending - 1 x daily - 7 x weekly - 1 sets - 5 reps - 5 hold doorway hip flexor stretch (Mirrored) - 1 x daily - 7 x weekly - 3 sets - 5 reps Isometric Gluteus Medius at Wall - 1 x daily - 7 x weekly - 1 sets - 10 reps

## 2021-02-23 ENCOUNTER — Ambulatory Visit: Payer: BC Managed Care – PPO | Attending: Family Medicine | Admitting: Physical Therapy

## 2021-02-23 ENCOUNTER — Encounter: Payer: Self-pay | Admitting: Physical Therapy

## 2021-02-23 ENCOUNTER — Other Ambulatory Visit: Payer: Self-pay

## 2021-02-23 DIAGNOSIS — M25551 Pain in right hip: Secondary | ICD-10-CM | POA: Diagnosis not present

## 2021-02-23 DIAGNOSIS — M25561 Pain in right knee: Secondary | ICD-10-CM | POA: Diagnosis present

## 2021-02-23 DIAGNOSIS — M62838 Other muscle spasm: Secondary | ICD-10-CM | POA: Diagnosis present

## 2021-02-23 DIAGNOSIS — M6281 Muscle weakness (generalized): Secondary | ICD-10-CM | POA: Insufficient documentation

## 2021-02-23 NOTE — Therapy (Addendum)
[facility] @ [place], [place], [address] Phone: [phone]   Fax:  [phone]  Physical Therapy Treatment  Patient Details  Name: Jamie Allison MRN: [id number] Date of Birth: [birth year] Referring Provider (PT): Carolann Littler, MD   Encounter Date: 02/23/2021   PT End of Session - 02/23/21 1144     Visit Number 5    Date for PT Re-Evaluation 03/15/21    Authorization Type [employer] PPO    Authorization Time Period 01/18/21 to 03/15/21    PT Start Time 1100    PT Stop Time 1143    PT Time Calculation (min) 43 min    Activity Tolerance Patient tolerated treatment well    Behavior During Therapy [facility] for tasks assessed/performed             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   Subjective Assessment - 02/23/21 1102     Subjective I have had a hectic week so haven't done as much for the exercises.  Pain hasn't reached an 8/10 again, today is a 4/10.  I don't want to do the DN again today.  I still haven't gone back to work yet.    Diagnostic tests XRAY/MRI practically unremarkable    Patient Stated Goals decrease aching    Currently in Pain? Yes    Pain Score 4     Pain Location Leg    Pain Orientation Right;Anterior;Lateral    Pain Descriptors / Indicators Aching    Pain Onset More than a month ago                               [facility] Adult PT Treatment/Exercise - 02/23/21 0001       Self-Care   Self-Care Other Self-Care Comments    Other Self-Care  Comments  rolling pin to Rt anterior and lateral thigh to lengthen and release TPs      Lumbar Exercises: Stretches   Other Lumbar Stretch Exercise seated ball rollout for lumbar flexion and Lt SB 2x20"    Other Lumbar Stretch Exercise seated Lt SB with Rt overhead reach QL stretch 2x20"      Knee/Hip Exercises: Stretches   Other Knee/Hip Stretches doorway stretch: UE reach up and over for Rt QL, ITB and QL stretch 2x15" each      Knee/Hip Exercises: Standing   Lateral Step Up Right;Hand Hold: 1;Step Height: 2";10 reps    SLS Rt glut med "stand tall" holds x 5" x 5 reps      Manual Therapy   Manual Therapy Soft tissue mobilization;Passive ROM    Soft tissue mobilization in supine: Rt vastus lateralis and ITB, in Lt SL: Rt QL and gluteals, SI joint    Passive ROM in Lt SL: passive stretch Rt quad with hip flexor contract/relax 5x5"  PT Short Term Goals - 01/24/21 1111       PT SHORT TERM GOAL #1   Title Pt will be independent with her initial HEP to improve Rt LE ROM and strength.    Status On-going               PT Long Term Goals - 01/18/21 2258       PT LONG TERM GOAL #1   Title Pt will have greater than 100 deg of Rt hip flexion and 15 deg of hip IR to improve her squat technique for work/home mobility.    Time 8    Period Weeks    Status New    Target Date 03/15/21      PT LONG TERM GOAL #2   Title Pt will report atleast 50% improvement in her Rt LE pain from the start of PT.    Time 8    Period Weeks    Status New      PT LONG TERM GOAL #3   Title Pt will have atleast 4/5 MMT strength of the Rt glutes.    Time 8    Period Weeks    Status New      PT LONG TERM GOAL #4   Title Pt will be able to demonstrate proper squat technique and other work related tasks with proper form to avoid reinjury.    Time 8    Period Weeks    Status New                   Plan - 02/23/21 1144     Clinical Impression Statement  Pt arrived with report of reduced pain levels overall since last week's visit.  She declined DN repeat but PT did note improved tolerance of deeper tissue work today than was previously tolerated.  Pt does continue to have TPs along VL and ITB so PT advised use of rolling pin at home to continue release work.  STM and active stretching performed with improved Rt quad and hip flexor length with contract/relax today in Lt SL.  Pt with improving closed chain glut med activation today with progression to lateral step up on 2" riser.  PT reviewed all stretches given Pt has gotten away from HEP this past week.  Continue along POC with progression of lumbar and hip stabilization as tol.    PT Frequency 1x / week    PT Duration 8 weeks    PT Treatment/Interventions ADLs/Self Care Home Management;Cryotherapy;Moist Heat;Therapeutic activities;Patient/family education;Neuromuscular re-education;Therapeutic exercise;Manual techniques;Dry needling;Taping;Joint Manipulations    PT Next Visit Plan Pt declined DN#2 but good tolerance of deeper STM VL and ITB, did Pt use rolling pin for Rt thigh, continue quad and hip flexor stretching with contract/relax, progress lumbar and hip stabilization as tol    PT Home Exercise Plan Access Code: [email or web address]    Consulted and Agree with Plan of Care Patient             Patient will benefit from skilled therapeutic intervention in order to improve the following deficits and impairments:     Visit Diagnosis: Pain in right hip  Right knee pain, unspecified chronicity  Muscle weakness (generalized)  Other muscle spasm     Problem List Patient Active Problem List   Diagnosis Date Noted   Sickle cell trait ([place]) 03/30/2018   Obesity (BMI 30-39.9) 04/16/2013   POLYURIA 02/15/2010   CHLAMYDIAL INFECTION 11/29/2009   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  D/C PT.   Remaining deficits: See above   Education / Equipment: HEP  Patient agrees to discharge. Patient goals were partially met. Patient is being discharged due to not returning since the last visit.  Baruch Merl, PT 04/17/21 10:07 AM   [facility] @ [place], [place], [address] Phone: [phone]   Fax:  [phone]  Name: Jamie Allison MRN: [id number] Date of Birth: 01/05/1976

## 2021-03-02 ENCOUNTER — Encounter: Payer: BC Managed Care – PPO | Admitting: Physical Therapy

## 2021-03-09 ENCOUNTER — Ambulatory Visit: Payer: BC Managed Care – PPO | Admitting: Physical Therapy

## 2021-03-15 ENCOUNTER — Encounter: Payer: BC Managed Care – PPO | Admitting: Physical Therapy

## 2021-03-21 ENCOUNTER — Encounter: Payer: BC Managed Care – PPO | Admitting: Physical Therapy

## 2022-12-20 ENCOUNTER — Ambulatory Visit (INDEPENDENT_AMBULATORY_CARE_PROVIDER_SITE_OTHER): Payer: BC Managed Care – PPO | Admitting: Family Medicine

## 2022-12-20 ENCOUNTER — Encounter: Payer: Self-pay | Admitting: Family Medicine

## 2022-12-20 VITALS — BP 168/88 | HR 70 | Temp 98.1°F | Ht 64.0 in | Wt 210.5 lb

## 2022-12-20 DIAGNOSIS — M7061 Trochanteric bursitis, right hip: Secondary | ICD-10-CM | POA: Diagnosis not present

## 2022-12-20 DIAGNOSIS — M25561 Pain in right knee: Secondary | ICD-10-CM

## 2022-12-20 DIAGNOSIS — M25551 Pain in right hip: Secondary | ICD-10-CM | POA: Diagnosis not present

## 2022-12-20 DIAGNOSIS — B3731 Acute candidiasis of vulva and vagina: Secondary | ICD-10-CM

## 2022-12-20 DIAGNOSIS — I10 Essential (primary) hypertension: Secondary | ICD-10-CM | POA: Diagnosis not present

## 2022-12-20 MED ORDER — METHYLPREDNISOLONE ACETATE 40 MG/ML IJ SUSP
40.0000 mg | Freq: Once | INTRAMUSCULAR | Status: AC
Start: 2022-12-20 — End: 2022-12-20
  Administered 2022-12-20: 40 mg via INTRA_ARTICULAR

## 2022-12-20 MED ORDER — AMLODIPINE BESYLATE 5 MG PO TABS: 5.0000 mg | ORAL_TABLET | Freq: Every day | ORAL | 3 refills | Status: DC

## 2022-12-20 MED ORDER — METHYLPREDNISOLONE ACETATE 40 MG/ML IJ SUSP
40.0000 mg | Freq: Once | INTRAMUSCULAR | Status: DC
Start: 2022-12-20 — End: 2022-12-20

## 2022-12-20 MED ORDER — FLUCONAZOLE 100 MG PO TABS: 100.0000 mg | ORAL_TABLET | Freq: Every day | ORAL | 0 refills | Status: DC

## 2022-12-20 NOTE — Progress Notes (Unsigned)
Established Patient Office Visit  Subjective   Patient ID: Jamie Allison, female    DOB: 09/15/1975  Age: 47 y.o. MRN: [id number]  Chief Complaint  Patient presents with   Leg Pain    Patient complains of right knee and hip pain, x3 weeks, Tried Tylenol     HPI  {History (Optional):23778} Jamie Allison has history of hypertension, past history of depression, obesity, sickle cell trait.  She is seen today for the following items  Right hip pain especially laterally for at least the past 3 weeks.  Denies any injury.  She is also had some chronic intermittent right knee pain and perhaps some slight swelling recently.  No locking or giving way.  She saw someone in [place] and was prescribed some type of oral medication also tried topical diclofenac without much relief.  Previous x-rays couple years ago showed mild osteoarthritis right hip  She has hypertension and has been treated with amlodipine 5 mg daily in the past.  However, she ran out of her medications several months ago.  Denies any headaches.  She had very stressful year.  Her mother died following complicated illness in June.  She is still working at [employer].  She has history of recurrent yeast vaginitis and fungal rash in groin region which has been resistant to topicals and requesting refill of fluconazole which has helped in the past.  She has no history of diabetes.  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 She has never used smokeless tobacco. She reports current alcohol use. She reports that she does not use  drugs. family history includes Arthritis in an other family member; Depression in an other family member; Hypertension in an other family member. No Known Allergies  Review of Systems  Constitutional:  Negative for malaise/fatigue.  Eyes:  Negative for blurred vision.  Respiratory:  Negative for shortness of breath.   Cardiovascular:  Negative for chest pain.  Neurological:  Negative for dizziness, weakness and headaches.      Objective:     BP (!) 168/88 (BP Location: Left Arm, Cuff Size: Large)   Pulse 70   Temp 98.1 F (36.7 C) (Oral)   Ht 5\' 4"  (1.626 m)   Wt 210 lb 8 oz (95.5 kg)   SpO2 98%   BMI 36.13 kg/m  {Vitals History (Optional):23777}  Physical Exam Vitals reviewed.  Cardiovascular:     Rate and Rhythm: Normal rate and regular rhythm.  Musculoskeletal:     Comments: Right hip reveals fairly good range of motion.  She does have tenderness over the greater trochanteric bursa region.  Right knee reveals small effusion.  She has some mild medial joint line tenderness.  No warmth or erythema.  No ecchymosis.  Full range of motion.  Neurological:     Mental Status: She is alert.      No results found for any visits on 12/20/22.  {Labs (Optional):23779}  The 10-year ASCVD risk score (Arnett DK, et al., 2019) is: 4.7%  Assessment & Plan:   #1 hypertension poorly controlled.  Patient currently off amlodipine.  Refill amlodipine 5 mg daily #90 with 3 refills.  Get back promptly on this medication and watch sodium intake and set up 3-week follow-up. May need combination therapy with degree of elevation today  #2 right greater trochanteric bursitis.  She has had 3 weeks of pain without improvement with oral anti-inflammatories.  Consider icing.  Discussed risk and benefits of injection with steroid.  We discussed risk of bleeding, bruising, low low risk of infection.  Patient consented.  Prep greater trochanteric bursa region with Betadine.  Using 25-gauge 1-1/2  inch needle checked and 40 mg of Depo-Medrol and 2 cc of 2% plain Xylocaine and patient tolerated well.  #3 right knee pain.  Denies injury.  Question meniscal.  Avoid regular nonsteroidals with her blood pressure elevation today.  Continue topical diclofenac.  Reassess in 3 weeks.  If not improved at that point consider sports medicine referral  #4 recurrent Candida infections.  Patient requesting refill fluconazole which has helped when topicals have not in the past.  Refill fluconazole 100 mg once daily for 7 days   Return in about 3 weeks (around 01/10/2023).    Evelena Peat, MD

## 2022-12-20 NOTE — Patient Instructions (Signed)
Get back on the Amlodipine as soon as possible.  Set up 3 week follow up.

## 2023-01-07 ENCOUNTER — Emergency Department (HOSPITAL_COMMUNITY)
Admission: EM | Admit: 2023-01-07 | Discharge: 2023-01-07 | Disposition: A | Discharge status: Home / Self Care | Payer: BC Managed Care – PPO | Attending: Emergency Medicine | Admitting: Emergency Medicine

## 2023-01-07 ENCOUNTER — Other Ambulatory Visit: Payer: Self-pay

## 2023-01-07 ENCOUNTER — Encounter (HOSPITAL_COMMUNITY): Payer: Self-pay

## 2023-01-07 DIAGNOSIS — R1013 Epigastric pain: Secondary | ICD-10-CM | POA: Insufficient documentation

## 2023-01-07 DIAGNOSIS — R112 Nausea with vomiting, unspecified: Secondary | ICD-10-CM | POA: Insufficient documentation

## 2023-01-07 DIAGNOSIS — Z79899 Other long term (current) drug therapy: Secondary | ICD-10-CM | POA: Diagnosis not present

## 2023-01-07 DIAGNOSIS — I10 Essential (primary) hypertension: Secondary | ICD-10-CM | POA: Insufficient documentation

## 2023-01-07 LAB — CBC
HCT: 33.6 % — ABNORMAL LOW (ref 36.0–46.0)
Hemoglobin: 10.7 g/dL — ABNORMAL LOW (ref 12.0–15.0)
MCH: 26.2 pg (ref 26.0–34.0)
MCHC: 31.8 g/dL (ref 30.0–36.0)
MCV: 82.4 fL (ref 80.0–100.0)
Platelets: 351 10*3/uL (ref 150–400)
RBC: 4.08 MIL/uL (ref 3.87–5.11)
RDW: 14.2 % (ref 11.5–15.5)
WBC: 5 10*3/uL (ref 4.0–10.5)
nRBC: 0 % (ref 0.0–0.2)

## 2023-01-07 LAB — URINALYSIS, ROUTINE W REFLEX MICROSCOPIC
Bilirubin Urine: NEGATIVE
Glucose, UA: NEGATIVE mg/dL
Ketones, ur: NEGATIVE mg/dL
Leukocytes,Ua: NEGATIVE
Nitrite: NEGATIVE
Protein, ur: NEGATIVE mg/dL
RBC / HPF: >50 RBC/hpf (ref 0–5)
Specific Gravity, Urine: 1.015 (ref 1.005–1.030)
pH: 5 (ref 5.0–8.0)

## 2023-01-07 LAB — COMPREHENSIVE METABOLIC PANEL
ALT: 15 U/L (ref 0–44)
AST: 18 U/L (ref 15–41)
Albumin: 3.3 g/dL — ABNORMAL LOW (ref 3.5–5.0)
Alkaline Phosphatase: 48 U/L (ref 38–126)
Anion gap: 9 (ref 5–15)
BUN: 15 mg/dL (ref 6–20)
CO2: 24 mmol/L (ref 22–32)
Calcium: 8.6 mg/dL — ABNORMAL LOW (ref 8.9–10.3)
Chloride: 104 mmol/L (ref 98–111)
Creatinine, Ser: 0.9 mg/dL (ref 0.44–1.00)
GFR, Estimated: >60 mL/min (ref >60)
Glucose, Bld: 111 mg/dL — ABNORMAL HIGH (ref 70–99)
Potassium: 3.6 mmol/L (ref 3.5–5.1)
Sodium: 137 mmol/L (ref 135–145)
Total Bilirubin: 0.4 mg/dL (ref 0.3–1.2)
Total Protein: 6.1 g/dL — ABNORMAL LOW (ref 6.5–8.1)

## 2023-01-07 LAB — LIPASE, BLOOD: Lipase: 33 U/L (ref 11–51)

## 2023-01-07 LAB — HCG, SERUM, QUALITATIVE: Preg, Serum: NEGATIVE

## 2023-01-07 MED ORDER — SODIUM CHLORIDE 0.9 % IV BOLUS
1000.0000 mL | Freq: Once | INTRAVENOUS | Status: AC
  Administered 2023-01-07: 1000 mL via INTRAVENOUS

## 2023-01-07 MED ORDER — ONDANSETRON HCL 4 MG PO TABS: 4.0000 mg | ORAL_TABLET | Freq: Four times a day (QID) | ORAL | 0 refills | Status: AC

## 2023-01-07 MED ORDER — ONDANSETRON HCL 4 MG/2ML IJ SOLN
4.0000 mg | Freq: Once | INTRAMUSCULAR | Status: AC
  Administered 2023-01-07: 4 mg via INTRAVENOUS
  Filled 2023-01-07: qty 2

## 2023-01-07 NOTE — ED Provider Notes (Signed)
[facility] EMERGENCY DEPARTMENT AT [facility] Provider Note   CSN: [id number] Arrival date & time: 01/07/23  6440     History  Chief Complaint  Patient presents with   Abdominal Pain    Jamie Allison is a 47 y.o. female with a history of hypertension, sickle cell trait, and depression who presents to the ED today for abdominal pain.  Patient reports that 3 days ago, she was vomiting all day and could not hold down any food.  Since then the vomiting has improved.  Now she only feels nauseous and has epigastric pain.  The pain does not radiate.  She is able to eat food and drink for the past 2 days without vomiting. Denies fever, cough, dysuria or changes to bowel habits.  She denies eating any new foods.  She did drink alcohol and smoke marijuana 4 days ago, prior to the onset of vomiting.  No other complaints or concerns at this time.    Home Medications Prior to Admission medications   Medication Sig Start Date End Date Taking? Authorizing Provider  ondansetron (ZOFRAN) 4 MG tablet Take 1 tablet (4 mg total) by mouth every 6 (six) hours. 01/07/23 02/06/23 Yes Maxwell Marion, PA-C  amLODipine (NORVASC) 5 MG tablet Take 1 tablet (5 mg total) by mouth daily. 12/20/22   Burchette, Elberta Fortis, MD  Biotin 34742 MCG TABS Take by mouth.    [provider]  famotidine (PEPCID) 20 MG tablet Take 1 tablet (20 mg total) by mouth 2 (two) times daily. 08/24/19 09/23/19  Terald Sleeper, MD  fluconazole (DIFLUCAN) 100 MG tablet Take 1 tablet (100 mg total) by mouth daily. 12/20/22   Burchette, Elberta Fortis, MD  multivitamin-iron-minerals-folic acid (CENTRUM) chewable tablet Chew 1 tablet by mouth daily. 09/04/15   Burchette, Elberta Fortis, MD      Allergies    Patient has no known allergies.    Review of Systems   Review of Systems  Gastrointestinal:  Positive for abdominal pain.  All other systems reviewed and are negative.   Physical Exam Updated Vital Signs BP (!) 203/67 Comment: Pt  states she has not taken her BP meds in a couple of days  Pulse (!) 57   Temp 98.3 F (36.8 C) (Oral)   Resp 17   LMP 01/07/2023 (Exact Date)   SpO2 98%  Physical Exam Vitals and nursing note reviewed.  Constitutional:      General: She is not in acute distress.    Appearance: Normal appearance. She is not toxic-appearing.  HENT:     Head: Normocephalic and atraumatic.     Mouth/Throat:     Mouth: Mucous membranes are moist.  Eyes:     Conjunctiva/sclera: Conjunctivae normal.     Pupils: Pupils are equal, round, and reactive to light.  Cardiovascular:     Rate and Rhythm: Normal rate and regular rhythm.     Pulses: Normal pulses.     Heart sounds: Normal heart sounds.  Pulmonary:     Effort: Pulmonary effort is normal.     Breath sounds: Normal breath sounds.  Abdominal:     Palpations: Abdomen is soft.     Tenderness: There is abdominal tenderness.     Comments: Epigastric tenderness to palpation  Skin:    General: Skin is warm and dry.     Findings: No rash.  Neurological:     General: No focal deficit present.     Mental Status: She is alert.  Sensory: No sensory deficit.     Motor: No weakness.  Psychiatric:        Mood and Affect: Mood normal.        Behavior: Behavior normal.     ED Results / Procedures / Treatments   Labs (all labs ordered are listed, but only abnormal results are displayed) Labs Reviewed  COMPREHENSIVE METABOLIC PANEL - Abnormal; Notable for the following components:      Result Value   Glucose, Bld 111 (*)    Calcium 8.6 (*)    Total Protein 6.1 (*)    Albumin 3.3 (*)    All other components within normal limits  CBC - Abnormal; Notable for the following components:   Hemoglobin 10.7 (*)    HCT 33.6 (*)    All other components within normal limits  URINALYSIS, ROUTINE W REFLEX MICROSCOPIC - Abnormal; Notable for the following components:   Hgb urine dipstick LARGE (*)    Bacteria, UA RARE (*)    All other components within  normal limits  LIPASE, BLOOD  HCG, SERUM, QUALITATIVE    EKG None  Radiology No results found.  Procedures Procedures: not indicated.   Medications Ordered in ED Medications  ondansetron (ZOFRAN) injection 4 mg (4 mg Intravenous Given 01/07/23 0827)  sodium chloride 0.9 % bolus 1,000 mL (1,000 mLs Intravenous New Bag/Given 01/07/23 0827)    ED Course/ Medical Decision Making/ A&P                                 Medical Decision Making Amount and/or Complexity of Data Reviewed Labs: ordered.  Risk Prescription drug management.   This patient presents to the ED for concern of abdominal pain, this involves an extensive number of treatment options, and is a complaint that carries with it a high risk of complications and morbidity.   Differential diagnosis includes: viral illness, gastritis, gastroenteritis, PUD, gastric ulcer, cyclical vomiting syndrome, cannabinoid hyperemesis syndrome, etc.   Comorbidities  See HPI above   Additional History  Additional history obtained from patient's previous records.   Lab Tests  I ordered and personally interpreted labs.  The pertinent results include:   CMP is within normal limits, no acute electrolyte abnormality or AKI. CBC shows no acute anemia or infection. Patient is currently menstruating, which could explain why Hgb is 10.8 - no dizziness or weakness. Lipase is unremarkable. Serum pregnancy is negative. UA shows rare bacteria.  Patient denies any dysuria at this time, so we will not treat.   Problem List / ED Course / Critical Interventions / Medication Management  Abdominal pain and nausea I ordered medications including: Normal saline and Zofran for nausea  Reevaluation of the patient after these medicines showed that the patient improved I have reviewed the patients home medicines and have made adjustments as needed   Social Determinants of Health  Marijuana and alcohol use   Test / Admission -  Considered  Discussed results with patient. Hemodynamically stable and safe for discharge home. Prescription for Zofran sent to the pharmacy. Return precautions provided.       Final Clinical Impression(s) / ED Diagnoses Final diagnoses:  Nausea and vomiting, unspecified vomiting type    Rx / DC Orders ED Discharge Orders          Ordered    ondansetron (ZOFRAN) 4 MG tablet  Every 6 hours        01/07/23 0842  Maxwell Marion, PA-C 01/07/23 8416    Tegeler, Canary Brim, MD 01/07/23 [id number]

## 2023-01-07 NOTE — Discharge Instructions (Signed)
As discussed, you labs all look good.  No signs of electrolyte abnormality.  Make sure you are drinking plenty of fluids with electrolytes even if you are not hungry.  I have sent a prescription of Zofran to your pharmacy, you can take this as needed for nausea.   Please follow-up with your primary care provider in 3 to 5 days for reevaluation of your symptoms.  If you do not have one, there is a phone number in the discharge paperwork that you can call that we will help you establish with one.  Get help right away if: You have pain in your chest, neck, arm, or jaw. You feel very weak or you faint. You vomit again and again. You have vomit that is bright red or looks like black coffee grounds. You have bloody or black poop (stools) or poop that looks like tar. You have a very bad headache, a stiff neck, or both. You have very bad pain, cramping, or bloating in your belly (abdomen). You have trouble breathing. You are breathing very quickly. Your heart is beating very quickly. Your skin feels cold and clammy. You feel confused. You have signs of losing too much water in your body, such as: Dark pee, very little pee, or no pee. Cracked lips. Dry mouth. Sunken eyes. Sleepiness. Weakness.

## 2023-01-07 NOTE — ED Triage Notes (Signed)
Pt arrived from home via POV c/o abd pain 7/10. Emesis on Sunday, zero emesis on sat, severe abd pain this morning.

## 2023-04-01 ENCOUNTER — Ambulatory Visit: Payer: BC Managed Care – PPO | Admitting: Adult Health

## 2023-04-01 ENCOUNTER — Encounter: Payer: Self-pay | Admitting: Adult Health

## 2023-04-01 VITALS — BP 150/100 | HR 71 | Temp 98.8°F | Ht 64.0 in | Wt 210.0 lb

## 2023-04-01 DIAGNOSIS — B9789 Other viral agents as the cause of diseases classified elsewhere: Secondary | ICD-10-CM | POA: Diagnosis not present

## 2023-04-01 DIAGNOSIS — R051 Acute cough: Secondary | ICD-10-CM | POA: Diagnosis not present

## 2023-04-01 DIAGNOSIS — I10 Essential (primary) hypertension: Secondary | ICD-10-CM | POA: Diagnosis not present

## 2023-04-01 DIAGNOSIS — J988 Other specified respiratory disorders: Secondary | ICD-10-CM | POA: Diagnosis not present

## 2023-04-01 LAB — POCT RAPID STREP A (OFFICE): Rapid Strep A Screen: NEGATIVE

## 2023-04-01 LAB — POC COVID19 BINAXNOW: SARS Coronavirus 2 Ag: NEGATIVE

## 2023-04-01 MED ORDER — BENZONATATE 200 MG PO CAPS: 200.0000 mg | ORAL_CAPSULE | Freq: Three times a day (TID) | ORAL | 0 refills | Status: DC | PRN

## 2023-04-01 NOTE — Progress Notes (Signed)
Subjective:    Patient ID: Jamie Allison, female    DOB: 1976/02/16, 47 y.o.   MRN: [id number]  Cough  Sore Throat  Associated symptoms include coughing.    47 year old female who  has a past medical history of CHLAMYDIAL INFECTION (11/29/2009), DEPRESSION (10/20/2008), EXOGENOUS OBESITY (11/29/2009), Headache(784.0), Herpes, HYPERTENSION (10/20/2008), LUMBAR STRAIN, ACUTE (08/17/2009), Polyuria (02/15/2010), and Sickle cell anemia (HCC).  She presents to the office today for an acute issue. Her symptoms started three days ago, when she woke up to go to work she did not feel well. All of her symptoms came at the same time. Her symptoms include that of a sore on again off again sore throat, dry cough, frontal headache and diarrhea.   She denies fevers, sinus pain or pressure, ear pain, nausea or vomiting.   She has used cold and flu medication with some improvement.    Review of Systems  Respiratory:  Positive for cough.    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   Headache(784.0)    Herpes    HYPERTENSION 10/20/2008   LUMBAR STRAIN, ACUTE 08/17/2009   Polyuria 02/15/2010   Sickle cell anemia (HCC)    trait    Social History   Socioeconomic History   Marital status: Single    Spouse name: Not on file   Number of children: Not on file   Years of education: Not on file   Highest education level: Not on file  Occupational History   Not on file  Tobacco Use   Smoking status: Never   Smokeless tobacco: Never  Vaping Use   Vaping status: Never Used  Substance and Sexual Activity   Alcohol use: Yes   Drug use: No   Sexual activity: Not on file  Other Topics Concern   Not on file  Social History Narrative   Not on file   Social Determinants of Health   Financial Resource Strain: Not on file  Food Insecurity: Not on file  Transportation Needs: Not on file  Physical Activity:  Not on file  Stress: Not on file  Social Connections: Not on file  Intimate Partner Violence: Not on file    Past Surgical History:  Procedure Laterality Date   CESAREAN SECTION     X 3   TONSILLECTOMY AND ADENOIDECTOMY     TUBAL LIGATION      Family History  Problem Relation Age of Onset   Arthritis Other    Hypertension Other    Depression Other     No Known Allergies  Current Outpatient Medications on File Prior to Visit  Medication Sig Dispense Refill   amLODipine (NORVASC) 5 MG tablet Take 1 tablet (5 mg total) by mouth daily. 90 tablet 3   Biotin 62130 MCG TABS Take by mouth.     famotidine (PEPCID) 20 MG tablet Take 1 tablet (20 mg total) by mouth 2 (two) times daily. 60 tablet 0   fluconazole (DIFLUCAN) 100 MG tablet Take 1 tablet (100 mg total) by mouth daily. 7 tablet 0   multivitamin-iron-minerals-folic acid (CENTRUM) chewable tablet Chew 1 tablet by mouth daily. 30 tablet    No current facility-administered medications on file prior to visit.    BP (!) 150/100   Pulse 71   Temp 98.8 F (37.1 C) (Oral)   Ht 5\' 4"  (1.626 m)   Wt 210 lb (95.3  kg)   SpO2 98%   BMI 36.05 kg/m       Objective:   Physical Exam Vitals reviewed.  Constitutional:      Appearance: Normal appearance. She is well-developed.  Cardiovascular:     Rate and Rhythm: Normal rate and regular rhythm.     Pulses: Normal pulses.     Heart sounds: Normal heart sounds.  Pulmonary:     Effort: Pulmonary effort is normal.     Breath sounds: Normal breath sounds.  Musculoskeletal:        General: Normal range of motion.  Skin:    General: Skin is warm and dry.  Neurological:     General: No focal deficit present.     Mental Status: She is alert and oriented to person, place, and time.  Psychiatric:        Mood and Affect: Mood normal.        Behavior: Behavior normal.        Thought Content: Thought content normal.        Judgment: Judgment normal.        Assessment & Plan:    1. Viral respiratory infection  - POC COVID-19 BinaxNow- negative  - POCT rapid strep A - negative  - Likely viral respiratory infection. Will send in Tessalon pearls to help with the cough. Advised rest and hydration  - Work note given  - benzonatate (TESSALON) 200 MG capsule; Take 1 capsule (200 mg total) by mouth 3 (three) times daily as needed.  Dispense: 30 capsule; Refill: 0  2. Primary hypertension - BP elevated today.  - She did take her medication today but does not take it daily.  - We discussed adding medication to her regimen but she wants to work on what she needs to do first such as taking her medication daily, reducing sodium and losing weight  - Follow up with PCP   3. Acute cough  - benzonatate (TESSALON) 200 MG capsule; Take 1 capsule (200 mg total) by mouth 3 (three) times daily as needed.  Dispense: 30 capsule; Refill: 0   Shirline Frees, NP

## 2023-07-29 ENCOUNTER — Emergency Department (HOSPITAL_COMMUNITY)
Admission: EM | Admit: 2023-07-29 | Discharge: 2023-07-29 | Disposition: A | Discharge status: Home / Self Care | Attending: Emergency Medicine | Admitting: Emergency Medicine

## 2023-07-29 ENCOUNTER — Encounter (HOSPITAL_COMMUNITY): Payer: Self-pay | Admitting: Emergency Medicine

## 2023-07-29 ENCOUNTER — Other Ambulatory Visit: Payer: Self-pay

## 2023-07-29 ENCOUNTER — Emergency Department (HOSPITAL_COMMUNITY)

## 2023-07-29 DIAGNOSIS — K529 Noninfective gastroenteritis and colitis, unspecified: Secondary | ICD-10-CM | POA: Diagnosis not present

## 2023-07-29 DIAGNOSIS — D219 Benign neoplasm of connective and other soft tissue, unspecified: Secondary | ICD-10-CM

## 2023-07-29 DIAGNOSIS — R1084 Generalized abdominal pain: Secondary | ICD-10-CM | POA: Diagnosis present

## 2023-07-29 LAB — COMPREHENSIVE METABOLIC PANEL
ALT: 14 U/L (ref 0–44)
AST: 18 U/L (ref 15–41)
Albumin: 3.9 g/dL (ref 3.5–5.0)
Alkaline Phosphatase: 53 U/L (ref 38–126)
Anion gap: 9 (ref 5–15)
BUN: 10 mg/dL (ref 6–20)
CO2: 24 mmol/L (ref 22–32)
Calcium: 9.2 mg/dL (ref 8.9–10.3)
Chloride: 105 mmol/L (ref 98–111)
Creatinine, Ser: 0.93 mg/dL (ref 0.44–1.00)
GFR, Estimated: >60 mL/min (ref >60)
Glucose, Bld: 141 mg/dL — ABNORMAL HIGH (ref 70–99)
Potassium: 3.5 mmol/L (ref 3.5–5.1)
Sodium: 138 mmol/L (ref 135–145)
Total Bilirubin: 0.8 mg/dL (ref 0.0–1.2)
Total Protein: 7.3 g/dL (ref 6.5–8.1)

## 2023-07-29 LAB — CBC
HCT: 38.7 % (ref 36.0–46.0)
Hemoglobin: 12.4 g/dL (ref 12.0–15.0)
MCH: 26.6 pg (ref 26.0–34.0)
MCHC: 32 g/dL (ref 30.0–36.0)
MCV: 83 fL (ref 80.0–100.0)
Platelets: 413 10*3/uL — ABNORMAL HIGH (ref 150–400)
RBC: 4.66 MIL/uL (ref 3.87–5.11)
RDW: 14.1 % (ref 11.5–15.5)
WBC: 6.5 10*3/uL (ref 4.0–10.5)
nRBC: 0 % (ref 0.0–0.2)

## 2023-07-29 LAB — LIPASE, BLOOD: Lipase: 32 U/L (ref 11–51)

## 2023-07-29 LAB — HCG, SERUM, QUALITATIVE: Preg, Serum: NEGATIVE

## 2023-07-29 LAB — URINALYSIS, MICROSCOPIC (REFLEX): RBC / HPF: >50 RBC/hpf (ref 0–5)

## 2023-07-29 LAB — URINALYSIS, ROUTINE W REFLEX MICROSCOPIC
Bilirubin Urine: NEGATIVE
Glucose, UA: 100 mg/dL — AB
Ketones, ur: 15 mg/dL — AB
Nitrite: POSITIVE — AB
Protein, ur: >300 mg/dL — AB
Specific Gravity, Urine: 1.02 (ref 1.005–1.030)
pH: 6.5 (ref 5.0–8.0)

## 2023-07-29 MED ORDER — ONDANSETRON 4 MG PO TBDP
4.0000 mg | ORAL_TABLET | Freq: Once | ORAL | Status: AC | PRN
  Administered 2023-07-29: 4 mg via ORAL
  Filled 2023-07-29: qty 1

## 2023-07-29 MED ORDER — ONDANSETRON HCL 4 MG/2ML IJ SOLN
4.0000 mg | Freq: Once | INTRAMUSCULAR | Status: AC
  Administered 2023-07-29: 4 mg via INTRAVENOUS
  Filled 2023-07-29: qty 2

## 2023-07-29 MED ORDER — FENTANYL CITRATE PF 50 MCG/ML IJ SOSY
50.0000 ug | PREFILLED_SYRINGE | Freq: Once | INTRAMUSCULAR | Status: AC
  Administered 2023-07-29: 50 ug via INTRAVENOUS
  Filled 2023-07-29: qty 1

## 2023-07-29 MED ORDER — AMLODIPINE BESYLATE 5 MG PO TABS
5.0000 mg | ORAL_TABLET | Freq: Once | ORAL | Status: AC
  Administered 2023-07-29: 5 mg via ORAL
  Filled 2023-07-29: qty 1

## 2023-07-29 MED ORDER — CEPHALEXIN 500 MG PO CAPS: 500.0000 mg | ORAL_CAPSULE | Freq: Three times a day (TID) | ORAL | 0 refills | Status: DC

## 2023-07-29 MED ORDER — ONDANSETRON 4 MG PO TBDP
ORAL_TABLET | ORAL | Status: AC
  Filled 2023-07-29: qty 1

## 2023-07-29 MED ORDER — AMLODIPINE BESYLATE 5 MG PO TABS: 5.0000 mg | ORAL_TABLET | Freq: Every day | ORAL | 0 refills | Status: DC

## 2023-07-29 MED ORDER — ONDANSETRON HCL 4 MG/2ML IJ SOLN
4.0000 mg | Freq: Once | INTRAMUSCULAR | Status: DC
  Filled 2023-07-29: qty 2

## 2023-07-29 MED ORDER — IBUPROFEN 400 MG PO TABS
400.0000 mg | ORAL_TABLET | Freq: Once | ORAL | Status: AC | PRN
  Administered 2023-07-29: 400 mg via ORAL
  Filled 2023-07-29: qty 1

## 2023-07-29 MED ORDER — SODIUM CHLORIDE 0.9 % IV BOLUS
1000.0000 mL | Freq: Once | INTRAVENOUS | Status: AC
  Administered 2023-07-29: 1000 mL via INTRAVENOUS

## 2023-07-29 MED ORDER — FENTANYL CITRATE PF 50 MCG/ML IJ SOSY
50.0000 ug | PREFILLED_SYRINGE | Freq: Once | INTRAMUSCULAR | Status: DC
  Filled 2023-07-29: qty 1

## 2023-07-29 MED ORDER — DICYCLOMINE HCL 20 MG PO TABS: 20.0000 mg | ORAL_TABLET | Freq: Two times a day (BID) | ORAL | 0 refills | Status: AC

## 2023-07-29 MED ORDER — ONDANSETRON 4 MG PO TBDP
4.0000 mg | ORAL_TABLET | Freq: Three times a day (TID) | ORAL | 0 refills | Status: AC | PRN
Start: 2023-07-29 — End: ?

## 2023-07-29 MED ORDER — IOHEXOL 350 MG/ML SOLN
75.0000 mL | Freq: Once | INTRAVENOUS | Status: AC | PRN
  Administered 2023-07-29: 75 mL via INTRAVENOUS

## 2023-07-29 NOTE — ED Triage Notes (Signed)
 Pt reports lower abd pain that started last night with n/v/d. Denies urinary sx. Denies sick contacts. Reports she is having period cramps, just started her period today. Hx of htn, reports she has been out of her BP medications for the past month.

## 2023-07-29 NOTE — Discharge Instructions (Addendum)
 CT scan and blood work did not show any concerning findings.  You likely have gastroenteritis.  Drink plenty of fluids.  Take Bentyl as you need to for pain control.  You can take this every 8 hours.  Take Zofran for nausea as needed.  I have sent your blood pressure medication in for you as well.  Follow-up with your primary care provider.  Keep a blood pressure diary in the meantime.  Your CT did not show any concerning findings.  It did show a fibroid which can at times cause lots of lower abdominal pain.  Please follow-up with your gynecologist.  Medicines such as ibuprofen really work well for pain caused by fibroids.  You can take 600 mg of ibuprofen every 6-8 hours.

## 2023-07-29 NOTE — ED Notes (Signed)
 Pt understood d/c instructions and when to return to the ED. Medications were taught and understood. IV dc and VS retaken

## 2023-07-29 NOTE — ED Provider Notes (Addendum)
 [place] [facility]GENCY DEPARTMENT AT [facility] Provider Note   CSN: [id number] Arrival date & time: 07/29/23  1308     History  Chief Complaint  Patient presents with   Abdominal Pain    Jamie Allison is a 48 y.o. female.  48 year old female presents today for concern of abdominal pain that started last night with associated nausea, vomiting, diarrhea.  Pain did wake her up from sleep.  She denies any recent sick contacts.  She also started her period today.  She has currently endorses lower abdominal pain.  Has no other complaints.  She used to take amlodipine 5 mg.  Has been out of this for a month.  Denies any chest pain, shortness of breath, vision change, balance issues.  The history is provided by the patient. No language interpreter was used.       Home Medications Prior to Admission medications   Medication Sig Start Date End Date Taking? Authorizing Provider  amLODipine (NORVASC) 5 MG tablet Take 1 tablet (5 mg total) by mouth daily. Patient not taking: Reported on 07/29/2023 12/20/22   Kristian Covey, MD  Biotin 65784 MCG TABS Take by mouth. Patient not taking: Reported on 07/29/2023    [provider]  famotidine (PEPCID) 20 MG tablet Take 1 tablet (20 mg total) by mouth 2 (two) times daily. Patient not taking: Reported on 07/29/2023 08/24/19 04/01/23  Terald Sleeper, MD  multivitamin-iron-minerals-folic acid (CENTRUM) chewable tablet Chew 1 tablet by mouth daily. Patient not taking: Reported on 07/29/2023 09/04/15   Kristian Covey, MD      Allergies    Patient has no known allergies.    Review of Systems   Review of Systems  Constitutional:  Negative for chills and fever.  Respiratory:  Negative for shortness of breath.   Cardiovascular:  Negative for chest pain.  Gastrointestinal:  Positive for abdominal pain, diarrhea, nausea and vomiting.  Neurological:  Negative for light-headedness.  All other systems reviewed and are  negative.   Physical Exam Updated Vital Signs BP (!) 165/60 (BP Location: Left Arm)   Pulse 77   Temp 98 F (36.7 C)   Resp 17   Ht 5\' 4"  (1.626 m)   Wt 93.4 kg   LMP 07/29/2023   SpO2 100%   BMI 35.36 kg/m  Physical Exam Vitals and nursing note reviewed.  Constitutional:      General: She is not in acute distress.    Appearance: Normal appearance. She is not ill-appearing.  HENT:     Head: Normocephalic and atraumatic.     Nose: Nose normal.  Eyes:     Conjunctiva/sclera: Conjunctivae normal.  Cardiovascular:     Rate and Rhythm: Normal rate and regular rhythm.  Pulmonary:     Effort: Pulmonary effort is normal. No respiratory distress.     Breath sounds: No wheezing.  Abdominal:     General: There is no distension.     Palpations: Abdomen is soft.     Tenderness: There is abdominal tenderness. There is no right CVA tenderness, left CVA tenderness or guarding.  Musculoskeletal:        General: No deformity. Normal range of motion.     Cervical back: Normal range of motion.  Skin:    Findings: No rash.  Neurological:     Mental Status: She is alert.     ED Results / Procedures / Treatments   Labs (all labs ordered are listed, but only abnormal results  are displayed) Labs Reviewed  COMPREHENSIVE METABOLIC PANEL - Abnormal; Notable for the following components:      Result Value   Glucose, Bld 141 (*)    All other components within normal limits  CBC - Abnormal; Notable for the following components:   Platelets 413 (*)    All other components within normal limits  URINALYSIS, ROUTINE W REFLEX MICROSCOPIC - Abnormal; Notable for the following components:   Color, Urine RED (*)    APPearance TURBID (*)    Glucose, UA 100 (*)    Hgb urine dipstick LARGE (*)    Ketones, ur 15 (*)    Protein, ur >300 (*)    Nitrite POSITIVE (*)    Leukocytes,Ua MODERATE (*)    All other components within normal limits  URINALYSIS, MICROSCOPIC (REFLEX) - Abnormal; Notable for  the following components:   Bacteria, UA RARE (*)    All other components within normal limits  LIPASE, BLOOD  HCG, SERUM, QUALITATIVE    EKG None  Radiology No results found.  Procedures Procedures    Medications Ordered in ED Medications  ondansetron (ZOFRAN-ODT) disintegrating tablet 4 mg ( Oral Canceled Entry 07/29/23 0704)  ibuprofen (ADVIL) tablet 400 mg (400 mg Oral Given 07/29/23 0643)  sodium chloride 0.9 % bolus 1,000 mL (0 mLs Intravenous Stopped 07/29/23 1304)  fentaNYL (SUBLIMAZE) injection 50 mcg (50 mcg Intravenous Given 07/29/23 1010)  ondansetron (ZOFRAN) injection 4 mg (4 mg Intravenous Given 07/29/23 1010)  iohexol (OMNIPAQUE) 350 MG/ML injection 75 mL (75 mLs Intravenous Contrast Given 07/29/23 1213)    ED Course/ Medical Decision Making/ A&P Clinical Course as of 07/29/23 1522  Tue Jul 29, 2023  1509 CTAP, discharge if unremarkable. Typed up for dc. Gastroenteritis. Comfortable. Amlodipine to pharmacy. Coming with with AP, N/V/D. Comfortable and walking around since. [CG]  1510 Refused second dose of fentanyl [CG]    Clinical Course User Index [CG] Al Decant, PA-C                                 Medical Decision Making Amount and/or Complexity of Data Reviewed Labs: ordered. Radiology: ordered.  Risk Prescription drug management.   Medical Decision Making / ED Course   This patient presents to the ED for concern of abdominal pain, this involves an extensive number of treatment options, and is a complaint that carries with it a high risk of complications and morbidity.  The differential diagnosis includes gastroenteritis, colitis, diverticulitis, appendicitis, cholecystitis, small bowel obstruction  MDM: 48 year old female presents today for concern of abdominal pain with associated nausea, vomiting, diarrhea.  Symptoms started overnight.  This did wake her up from sleep.  Denies fever, dysuria.  She did start her menstrual cycle today.   Has no other complaints.  She does have history of C-sections otherwise no abdominal surgeries.  Reevaluation she does feel improved.  However CT scan of the abdomen has not resulted.  Will give her additional dose of pain medicine just to stay comfortable otherwise will await CT.  Her CBC is unremarkable, CMP without acute concern with the exception of glucose 141.  Lipase within normal.  Pregnancy test negative.  UA without evidence of UTI.  Appears contaminated.  No dysuria.  Patient at the end my shift is awaiting CT.  Signed out to oncoming provider.  If this is normal she can be discharged with Bentyl, Zofran, amlodipine for her hypertension.  I  have sent these into the pharmacy.  If the CT has a significant finding that can be addressed accordingly.  CT resulted prior to my shift.  Shows fibroid otherwise no acute concern.  Discussed close follow-up with gynecology.  Discussed taking NSAIDs.   Lab Tests: -I ordered, reviewed, and interpreted labs.   The pertinent results include:   Labs Reviewed  COMPREHENSIVE METABOLIC PANEL - Abnormal; Notable for the following components:      Result Value   Glucose, Bld 141 (*)    All other components within normal limits  CBC - Abnormal; Notable for the following components:   Platelets 413 (*)    All other components within normal limits  URINALYSIS, ROUTINE W REFLEX MICROSCOPIC - Abnormal; Notable for the following components:   Color, Urine RED (*)    APPearance TURBID (*)    Glucose, UA 100 (*)    Hgb urine dipstick LARGE (*)    Ketones, ur 15 (*)    Protein, ur >300 (*)    Nitrite POSITIVE (*)    Leukocytes,Ua MODERATE (*)    All other components within normal limits  URINALYSIS, MICROSCOPIC (REFLEX) - Abnormal; Notable for the following components:   Bacteria, UA RARE (*)    All other components within normal limits  LIPASE, BLOOD  HCG, SERUM, QUALITATIVE      EKG  EKG Interpretation Date/Time:    Ventricular Rate:    PR  Interval:    QRS Duration:    QT Interval:    QTC Calculation:   R Axis:      Text Interpretation:           Imaging Studies ordered: I ordered imaging studies including CT abdomen pelvis with contrast but this did not result at the end of my shift. I independently visualized and interpreted imaging. I agree with the radiologist interpretation   Medicines ordered and prescription drug management: Meds ordered this encounter  Medications   ondansetron (ZOFRAN-ODT) disintegrating tablet 4 mg   ibuprofen (ADVIL) tablet 400 mg   ondansetron (ZOFRAN-ODT) 4 MG disintegrating tablet    Chervenka, Caitlynn : cabinet override   sodium chloride 0.9 % bolus 1,000 mL   fentaNYL (SUBLIMAZE) injection 50 mcg   ondansetron (ZOFRAN) injection 4 mg   iohexol (OMNIPAQUE) 350 MG/ML injection 75 mL   fentaNYL (SUBLIMAZE) injection 50 mcg   ondansetron (ZOFRAN) injection 4 mg    -I have reviewed the patients home medicines and have made adjustments as needed     Reevaluation: After the interventions noted above, I reevaluated the patient and found that they have :improved  Co morbidities that complicate the patient evaluation  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  Discussed gynecology follow-up.  She has fibroid on CT.   Final Clinical Impression(s) / ED Diagnoses Final diagnoses:  Generalized abdominal pain  Gastroenteritis    Rx / DC Orders ED Discharge Orders          Ordered    amLODipine (NORVASC) 5 MG tablet  Daily        07/29/23 1437    dicyclomine (BENTYL) 20 MG tablet  2 times daily  07/29/23 1437    ondansetron (ZOFRAN-ODT) 4 MG disintegrating tablet  Every 8 hours  PRN        07/29/23 1437              Marita Kansas, PA-C 07/29/23 1446    Marita Kansas, PA-C 07/29/23 1523    Wynetta Fines, MD 07/29/23 [phone]

## 2023-07-29 NOTE — ED Provider Notes (Signed)
  Physical Exam  BP (!) 209/102   Pulse 71   Temp 97.9 F (36.6 C)   Resp 18   Ht 5\' 4"  (1.626 m)   Wt 93.4 kg   LMP 07/29/2023   SpO2 100%   BMI 35.36 kg/m   Physical Exam Vitals and nursing note reviewed.  Constitutional:      General: She is not in acute distress.    Appearance: She is well-developed.  HENT:     Head: Normocephalic and atraumatic.  Eyes:     Conjunctiva/sclera: Conjunctivae normal.  Cardiovascular:     Rate and Rhythm: Normal rate and regular rhythm.     Heart sounds: No murmur heard. Pulmonary:     Effort: Pulmonary effort is normal. No respiratory distress.     Breath sounds: Normal breath sounds.  Abdominal:     Palpations: Abdomen is soft.     Tenderness: There is abdominal tenderness in the suprapubic area.  Musculoskeletal:        General: No swelling.     Cervical back: Neck supple.  Skin:    General: Skin is warm and dry.     Capillary Refill: Capillary refill takes less than 2 seconds.  Neurological:     Mental Status: She is alert.  Psychiatric:        Mood and Affect: Mood normal.     Procedures  Procedures  ED Course / MDM   Clinical Course as of 07/29/23 1554  Tue Jul 29, 2023  1509 CTAP, discharge if unremarkable. Typed up for dc. Gastroenteritis. Comfortable. Amlodipine to pharmacy. Coming with with AP, N/V/D. Comfortable and walking around since. [CG]  1510 Refused second dose of fentanyl [CG]    Clinical Course User Index [CG] Al Decant, PA-C   Medical Decision Making Amount and/or Complexity of Data Reviewed Labs: ordered. Radiology: ordered.  Risk Prescription drug management.   48 year old female presents for evaluation.  Please see HPI for further details.  Patient signed out to me at shift change pending CT scan of patient abdomen and pelvis and reassessment.  Patient CT scan shows no acute intra-abdominal pathology.  Does show multiple fibroids.  Have advised patient of the findings.  Have  encouraged patient to follow-up with OB/GYN.  In terms of her UA, she is endorsing frequency so we will start her on Keflex and culture the patient urine.  She was advised to follow-up with her PCP.  Her refill of amlodipine has already been sent to her pharmacy.  Refill of Zofran is also sent in.  She will follow-up with her PCP.  She is stable to discharge.       Al Decant, PA-C 07/29/23 1554    Eber Hong, MD 07/31/23 [phone]

## 2023-08-01 LAB — URINE CULTURE: Culture: 40000 — AB

## 2023-08-02 ENCOUNTER — Telehealth (HOSPITAL_BASED_OUTPATIENT_CLINIC_OR_DEPARTMENT_OTHER): Payer: Self-pay

## 2023-08-02 NOTE — Telephone Encounter (Signed)
 Post ED Visit - Positive Culture Follow-up  Culture report reviewed by antimicrobial stewardship pharmacist: [facility] [x]  Delmar Landau, Pharm.D. []  Celedonio Miyamoto, Pharm.D., BCPS AQ-ID []  Garvin Fila, Pharm.D., BCPS []  Georgina Pillion, Pharm.D., BCPS []  [place], [address].D., BCPS, AAHIVP []  Estella Husk, Pharm.D., BCPS, AAHIVP []  Lysle Pearl, PharmD, BCPS []  Phillips Climes, PharmD, BCPS []  Agapito Games, PharmD, BCPS []  Verlan Friends, PharmD []  Mervyn Gay, PharmD, BCPS []  Vinnie Level, PharmD  [facility] []  Len Childs, PharmD []  Greer Pickerel, PharmD []  Adalberto Cole, PharmD []  Perlie Gold, Rph []  Lonell Face) Jean Rosenthal, PharmD []  Earl Many, PharmD []  Junita Push, PharmD []  Dorna Leitz, PharmD []  Terrilee Files, PharmD []  Lynann Beaver, PharmD []  Keturah Barre, PharmD []  Loralee Pacas, PharmD []  Bernadene Person, PharmD   Positive urine culture Treated with Cephalexin, organism sensitive to the same and no further patient follow-up is required at this time.  Sandria Senter 08/02/2023, 2:38 PM

## 2023-09-02 NOTE — Progress Notes (Signed)
 NEW GYNECOLOGY PATIENT Patient name: Jamie Allison MRN [id number]  Date of birth: 1976/02/16 Chief Complaint:   No chief complaint on file.     History:  CHRISTYN GUTKOWSKI is a 48 y.o. No obstetric history on file. being seen today for ED FOLLOW UP.    ED visit 07/29/23: presented with abdominal pain, treated for presumed UTI and CT A/P showing fibroids, recommend gyn follow up  Discussed the use of AI scribe software for clinical note transcription with the patient, who gave verbal consent to proceed.  History of Present Illness Jamie Allison is a 48 year old female who presents with heavy menstrual bleeding and pain.  She experiences heavy menstrual bleeding and significant pain, which led her to seek emergency care where she was diagnosed with fibroids. Her periods are consistently heavy and painful, with worsening symptoms over time. She reports heavy bleeding with large blood clots and prolonged menstrual cycles lasting longer than seven days, occurring twice a month for the past couple of years. She has not used any hormonal treatments or birth control for her periods and manages the pain with over-the-counter pain medications.  She is sexually active with a female partner and has undergone a tubal ligation as her contraception for pregnancy prevention.  She reports symptoms suggestive of perimenopause, including hot flashes and vaginal dryness, which have been present for the last few years. She has not used any treatments for these symptoms.  Additionally, she has a history of elevated blood pressure, for which she is currently on medication, although she is unsure of her current blood pressure readings.  She mentions experiencing stress incontinence, characterized by involuntary urine leakage when coughing, sneezing, or laughing, which has been occurring for the past couple of months.      Gynecologic History No LMP recorded. Contraception: tubal ligation Last Pap: 11/2021  NILM, HPV negative Last Mammogram:  not seen on file Last Colonoscopy: n/a  Obstetric History OB History  No obstetric history on file.    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 90 tablet 0   Biotin 59563 MCG TABS Take by mouth. (Patient not taking: Reported on 07/29/2023)     cephALEXin  (KEFLEX ) 500 MG capsule Take 1 capsule (500 mg total) by mouth 3 (three) times daily. 15 capsule 0   dicyclomine  (BENTYL ) 20 MG tablet Take 1 tablet (20 mg total) by mouth 2 (two) times daily. 20 tablet 0   famotidine  (PEPCID ) 20 MG tablet Take 1 tablet (20 mg total) by mouth 2 (two) times daily. (Patient not taking: Reported on 07/29/2023) 60 tablet 0   multivitamin-iron-minerals-folic acid (CENTRUM) chewable tablet Chew 1 tablet by mouth daily. (Patient not taking: Reported on 07/29/2023) 30 tablet    ondansetron  (ZOFRAN -ODT) 4 MG disintegrating tablet Take 1 tablet (4 mg total) by mouth every 8 (eight) hours as needed.  20 tablet 0   No current facility-administered medications on file prior to visit.    No Known Allergies  Social History:  reports that she has never smoked. She has never used smokeless tobacco. She reports current alcohol use. She reports that she does not use drugs.  Family History  Problem Relation Age of Onset   Arthritis Other    Hypertension Other    Depression Other     The following portions of the patient's history were reviewed and  updated as appropriate: allergies, current medications, past family history, past medical history, past social history, past surgical history and problem list.  Review of Systems Pertinent items noted in HPI and remainder of comprehensive ROS otherwise negative.  Physical Exam:  BP (!) 157/92   Pulse 68   Ht 5\' 4"  (1.626 m)   Wt 218 lb (98.9 kg)   BMI 37.42 kg/m  Physical Exam Vitals and nursing note reviewed. Exam conducted with a chaperone present.  Constitutional:      Appearance: Normal appearance.  Pulmonary:     Effort: Pulmonary effort is normal.  Abdominal:     Palpations: Abdomen is soft.  Genitourinary:    General: Normal vulva.     Exam position: Lithotomy position.  Neurological:     Mental Status: She is alert.        Assessment and Plan:   Assessment and Plan Assessment & Plan Uterine Fibroids Chronic uterine fibroids causing menorrhagia, severe pain, and large blood clots. Discussed treatment options including medication and surgical procedures. Progesterone-only formulations recommended due to hypertension. Surgical options include endometrial ablation, uterine artery embolization, Sonata, myomectomy, and hysterectomy. - Start progesterone-only medication due to hypertension. - Consider non-hormonal medication for vasomotor symptoms if needed. - Discuss surgical options if medication is ineffective, including endometrial ablation, uterine artery embolization, Sonata, myomectomy, and hysterectomy. - AUB labs  Perimenopause Symptoms include vasomotor symptoms and vaginal dryness. Vasomotor symptoms are uncomfortable but not dangerous. - Consider non-hormonal medication for vasomotor symptoms if needed.  Hypertension Hypertension noted. Currently on antihypertensive medication. Estrogen-containing medications avoided due to hypertension. - Monitor blood pressure regularly. - Avoid estrogen-containing medications due to hypertension.  Stress Urinary  Incontinence Intermittent stress urinary incontinence likely due to weakened pelvic floor muscles. - Recommend Kegel exercises to strengthen pelvic floor muscles. - Consider referral to pelvic floor physical therapy if symptoms persist.  General Health Maintenance Routine health maintenance discussed. Last Pap smear in 2023, due next between 2026 and 2028. Additional lab work needed to assess for other causes of bleeding. - Now s/p Pap smear. - Conduct STD testing including HIV and syphilis. - Order additional lab work to assess for other causes of bleeding.    Routine preventative health maintenance measures emphasized. Please refer to After Visit Summary for other counseling recommendations.   Follow-up: No follow-ups on file.      Kiki Pelton, MD Obstetrician & Gynecologist, Faculty Practice [facility] for [employer], [facility]

## 2023-09-03 ENCOUNTER — Other Ambulatory Visit (HOSPITAL_COMMUNITY)
Admission: RE | Admit: 2023-09-03 | Discharge: 2023-09-03 | Disposition: A | Discharge status: Home / Self Care | Source: Ambulatory Visit | Attending: Obstetrics and Gynecology | Admitting: Obstetrics and Gynecology

## 2023-09-03 ENCOUNTER — Ambulatory Visit: Admitting: Obstetrics and Gynecology

## 2023-09-03 ENCOUNTER — Encounter: Payer: Self-pay | Admitting: Obstetrics and Gynecology

## 2023-09-03 VITALS — BP 157/92 | HR 68 | Ht 64.0 in | Wt 218.0 lb

## 2023-09-03 DIAGNOSIS — N946 Dysmenorrhea, unspecified: Secondary | ICD-10-CM | POA: Diagnosis not present

## 2023-09-03 DIAGNOSIS — Z113 Encounter for screening for infections with a predominantly sexual mode of transmission: Secondary | ICD-10-CM | POA: Insufficient documentation

## 2023-09-03 DIAGNOSIS — Z124 Encounter for screening for malignant neoplasm of cervix: Secondary | ICD-10-CM | POA: Insufficient documentation

## 2023-09-03 DIAGNOSIS — N939 Abnormal uterine and vaginal bleeding, unspecified: Secondary | ICD-10-CM | POA: Diagnosis not present

## 2023-09-03 MED ORDER — NORETHINDRONE ACETATE 5 MG PO TABS
5.0000 mg | ORAL_TABLET | Freq: Every day | ORAL | 6 refills | Status: DC
Start: 2023-09-03 — End: 2023-10-24

## 2023-09-03 NOTE — Progress Notes (Signed)
 Pt presents for ED f/u. Pt c/o abd pain. Pelvic US  done 07-29-23 Pain only present during menses with heavy bleeding and clots.   Last PAP unknown  Requesting STD testing

## 2023-09-04 ENCOUNTER — Encounter: Payer: Self-pay | Admitting: Obstetrics and Gynecology

## 2023-09-04 ENCOUNTER — Other Ambulatory Visit: Payer: Self-pay | Admitting: Obstetrics and Gynecology

## 2023-09-04 DIAGNOSIS — B9689 Other specified bacterial agents as the cause of diseases classified elsewhere: Secondary | ICD-10-CM

## 2023-09-04 LAB — CERVICOVAGINAL ANCILLARY ONLY
Bacterial Vaginitis (gardnerella): POSITIVE — AB
Candida Glabrata: NEGATIVE
Candida Vaginitis: NEGATIVE
Chlamydia: NEGATIVE
Comment: NEGATIVE
Comment: NEGATIVE
Comment: NEGATIVE
Comment: NEGATIVE
Comment: NEGATIVE
Comment: NORMAL
Neisseria Gonorrhea: NEGATIVE
Trichomonas: NEGATIVE

## 2023-09-04 LAB — RPR+HBSAG+HCVAB+...
HIV Screen 4th Generation wRfx: NONREACTIVE
Hep C Virus Ab: NONREACTIVE
Hepatitis B Surface Ag: NEGATIVE
RPR Ser Ql: NONREACTIVE

## 2023-09-04 LAB — TSH RFX ON ABNORMAL TO FREE T4: TSH: 1.08 u[IU]/mL (ref 0.450–4.500)

## 2023-09-04 LAB — HEMOGLOBIN A1C
Est. average glucose Bld gHb Est-mCnc: 123 mg/dL
Hgb A1c MFr Bld: 5.9 % — ABNORMAL HIGH (ref 4.8–5.6)

## 2023-09-04 LAB — FOLLICLE STIMULATING HORMONE: FSH: 5.4 m[IU]/mL

## 2023-09-04 MED ORDER — METRONIDAZOLE 500 MG PO TABS
500.0000 mg | ORAL_TABLET | Freq: Two times a day (BID) | ORAL | 0 refills | Status: AC
Start: 2023-09-04 — End: 2023-09-11

## 2023-09-08 LAB — CYTOLOGY - PAP
Comment: NEGATIVE
Diagnosis: NEGATIVE
High risk HPV: NEGATIVE

## 2023-09-30 ENCOUNTER — Ambulatory Visit: Admitting: Obstetrics and Gynecology

## 2023-10-24 ENCOUNTER — Ambulatory Visit: Admitting: Obstetrics and Gynecology

## 2023-10-24 DIAGNOSIS — N939 Abnormal uterine and vaginal bleeding, unspecified: Secondary | ICD-10-CM

## 2023-10-24 DIAGNOSIS — N946 Dysmenorrhea, unspecified: Secondary | ICD-10-CM

## 2023-10-24 MED ORDER — NAPROXEN 500 MG PO TABS
500.0000 mg | ORAL_TABLET | Freq: Two times a day (BID) | ORAL | 2 refills | Status: AC
Start: 2023-10-24 — End: ?

## 2023-10-24 MED ORDER — NORETHINDRONE ACETATE 5 MG PO TABS: 10.0000 mg | ORAL_TABLET | Freq: Every day | ORAL | 11 refills | Status: AC

## 2023-10-24 NOTE — Progress Notes (Signed)
 Pt is in office to discuss pain with cycle.  Pt states she feels she is taking too much Tylenol .  Pt has skipped some cycles and also had prolonged bleeding.

## 2023-10-28 NOTE — Progress Notes (Signed)
 GYNECOLOGY VISIT  Patient name: Jamie Allison MRN [id number]  Date of birth: 04/10/1976 Chief Complaint:   Follow-up  History:  Jamie Allison is a 48 y.o. G3P2100 being seen today for dysmenorrhea.   Discussed the use of AI scribe software for clinical note transcription with the patient, who gave verbal consent to proceed.  History of Present Illness Jamie Allison is a 48 year old female who presents with severe menstrual pain.  She experiences severe menstrual pain that has become unbearable, prompting her to seek medical attention. She was previously prescribed norethindrone , which she took at night. While on the medication, she noticed a reduction in menstrual flow but not a complete cessation. Despite this, the pain persisted and has returned after finishing the medication. The pain is severe enough to impact her daily life.  She manages her pain with Tylenol  due to arthritis and avoids using ibuprofen  or naproxen  because of concerns about blood pressure elevation. She takes Tylenol  for arthritis pain, which affects her knees, hips, and feet, but notes that it is no longer effective. Her arthritis pain is significant, affecting her knees and hips, and she has previously seen a therapist for this issue.  She experiences hot flashes, which have slightly improved with medication. She has a history of high blood pressure, which she monitors at home, and notes that her blood pressure tends to rise with pain. She recently restarted her blood pressure medication after running out.     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   Health Maintenance:   Last pap     Component Value Date/Time   DIAGPAP  09/03/2023 1613    - Negative for intraepithelial lesion or malignancy (NILM)   DIAGPAP  01/02/2018 0000    NEGATIVE FOR INTRAEPITHELIAL LESIONS OR MALIGNANCY.   HPVHIGH Negative 09/03/2023 1613   ADEQPAP  09/03/2023 1613    Satisfactory for evaluation; transformation zone component PRESENT.   ADEQPAP  01/02/2018 0000    Satisfactory for evaluation  endocervical/transformation zone component PRESENT.    Last mammogram: not seen on file   Review of Systems:  Pertinent items are noted in HPI. Comprehensive review of systems was otherwise negative.   Objective:  Physical Exam BP (!) 152/89   Pulse 60   Ht 5\' 4"  (1.626 m)   Wt 219 lb (99.3 kg)   LMP 10/22/2023   BMI 37.59 kg/m    Physical Exam Vitals and nursing note reviewed.  Constitutional:      Appearance: Normal appearance.  HENT:     Head: Normocephalic and atraumatic.  Pulmonary:     Effort: Pulmonary effort is normal.  Skin:    General: Skin is warm and dry.  Neurological:     General: No focal deficit present.     Mental Status: She is alert.  Psychiatric:        Mood and Affect: Mood normal.        Behavior: Behavior normal.        Thought Content: Thought content normal.        Judgment: Judgment normal.         Assessment & Plan:   Assessment & Plan Dysmenorrhea Severe menstrual pain persists despite norethindrone  5 mg. Prefers non-surgical options. Discussed increasing norethindrone  and IUD as alternatives. Surgical options not preferred. - Increase norethindrone  to 10 mg daily. - Consider IUD if norethindrone  is ineffective. - Prescribe naproxen  for pain, up to twice daily. -  Monitor blood pressure at home, especially with naproxen .  Hot Flashes Slight improvement with norethindrone . Low-dose estrogen not ideal due to hypertension. - Continue norethindrone  for hot flash management.  Hypertension Pain may exacerbate hypertension. Increased blood pressure noted with pain. - Monitor blood pressure at home during pain episodes. - Ensure adherence to antihypertensive regimen.   Routine preventative health maintenance measures emphasized.  Kiki Pelton, MD [facility] for [facility], [facility]

## 2023-11-12 ENCOUNTER — Ambulatory Visit (INDEPENDENT_AMBULATORY_CARE_PROVIDER_SITE_OTHER): Admitting: Family Medicine

## 2023-11-12 ENCOUNTER — Encounter: Payer: Self-pay | Admitting: Family Medicine

## 2023-11-12 VITALS — BP 172/74 | HR 77 | Temp 97.8°F | Wt 225.2 lb

## 2023-11-12 DIAGNOSIS — M79671 Pain in right foot: Secondary | ICD-10-CM | POA: Diagnosis not present

## 2023-11-12 DIAGNOSIS — R7303 Prediabetes: Secondary | ICD-10-CM

## 2023-11-12 DIAGNOSIS — I10 Essential (primary) hypertension: Secondary | ICD-10-CM

## 2023-11-12 MED ORDER — AMLODIPINE BESYLATE 5 MG PO TABS: 5.0000 mg | ORAL_TABLET | Freq: Every day | ORAL | 3 refills | Status: AC

## 2023-11-12 NOTE — Progress Notes (Signed)
 Established Patient Office Visit  Subjective   Patient ID: Jamie Allison, female    DOB: [birth year]  Age: 48 y.o. MRN: [id number]  Chief Complaint  Patient presents with   Medication Refill   Leg Pain   Foot Pain    HPI   Jamie Allison is seen for medical follow-up.  She has history of hypertension.  She has had very stressful past year.  Her mom unfortunately passed away from cancer recently.  She ran out of medication which is amlodipine  last week.  She has been out now for several days.  Also she was placed recently on naproxen  and progesterone per GYN and was told that both of these could raise her blood pressure somewhat.  She has felt more poorly since running out of amlodipine .  She feels like her blood pressure was fairly well-controlled prior to that.  Does not eat out a lot.  Essentially vegetarian.  Tries to watch sodium intake.  No regular alcohol.  Also complaining of mostly right lateral foot pain.  Obtain new pair of shoes recently which have better cushioning in foot does feel somewhat better.  Pain is worse after prolonged periods of sitting and when she first gets up in the morning.  No Achilles pain.  Denies specific injury.  She had recent labs per GYN back in April with A1c 5.9%.  She is currently working at [employer] and is very pleased with her job change.  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 She has never used smokeless tobacco. She reports current alcohol use. She reports that she does not use drugs. family history includes Arthritis in an  other family member; Depression in an other family member; Fibroids in her mother; Hypertension in an other family member. No Known Allergies  Review of Systems  Constitutional:  Negative for malaise/fatigue.  Eyes:  Negative for blurred vision.  Respiratory:  Negative for shortness of breath.   Cardiovascular:  Negative for chest pain.  Gastrointestinal:  Negative for abdominal pain.  Neurological:  Negative for dizziness, weakness and headaches.      Objective:     BP (!) 172/74 (BP Location: Left Arm, Cuff Size: Large)   Pulse 77   Temp 97.8 F (36.6 C) (Oral)   Wt 225 lb 3.2 oz (102.2 kg)   LMP 10/22/2023   SpO2 97%   BMI 38.66 kg/m  BP Readings from Last 3 Encounters:  11/12/23 (!) 172/74  10/24/23 (!) 152/89  09/03/23 (!) 157/92   Wt Readings from Last 3 Encounters:  11/12/23 225 lb 3.2 oz (102.2 kg)  10/24/23 219 lb (99.3 kg)  09/03/23 218 lb (98.9 kg)      Physical Exam Vitals reviewed.  Constitutional:      General: She is not in acute distress.    Appearance: She is well-developed. She is not ill-appearing.   Eyes:     Pupils: Pupils are equal, round, and reactive to light.   Neck:  Thyroid: No thyromegaly.     Vascular: No JVD.   Cardiovascular:     Rate and Rhythm: Normal rate and regular rhythm.     Heart sounds:     No gallop.  Pulmonary:     Effort: Pulmonary effort is normal. No respiratory distress.     Breath sounds: Normal breath sounds. No wheezing or rales.   Musculoskeletal:     Cervical back: Neck supple.     Right lower leg: No edema.     Left lower leg: No edema.     Comments: Right foot reveals no edema.  No warmth.  No Achilles tenderness.  No tenderness over the plantar fascia region.  No specific areas of bony tenderness.   Neurological:     Mental Status: She is alert.      No results found for any visits on 11/12/23.    The 10-year ASCVD risk score (Arnett DK, et al., 2019) is: 5.5%    Assessment & Plan:    #1 hypertension poorly controlled probably related to running out of medication predominantly.  Refill amlodipine  5 mg once daily for 1 year.  Emphasized low-sodium diet.  Handout on DASH diet given.  Work on weight loss.  Monitor blood pressure closely at home next few weeks and be in touch if consistently greater 140/90.  Set up office follow-up and couple months to reassess.  Bring her cuff for comparison at that time.  #2 nonspecific right lateral foot pain.  No localizing tenderness.  Symptoms already improving some with new shoewear.  Recommend observation for now.  X-ray for any progressive or continued foot pain  #3 history of prediabetes range blood sugars.  Discussed importance of low glycemic diet and weight control as well as exercise.  Consider repeat A1c at follow-up  Return in about 3 months (around 02/12/2024).    Wolm Scarlet, MD

## 2023-11-12 NOTE — Patient Instructions (Addendum)
 Get back on the Amlodipine   Let me know if BP not consistently < 140/90 after getting back on the BP medication.   Bring in your cuff here at follow up to compare with ours.

## 2023-12-17 ENCOUNTER — Ambulatory Visit (INDEPENDENT_AMBULATORY_CARE_PROVIDER_SITE_OTHER): Admitting: Family Medicine

## 2023-12-17 ENCOUNTER — Encounter: Payer: Self-pay | Admitting: Family Medicine

## 2023-12-17 VITALS — BP 156/70 | HR 72 | Temp 98.1°F | Wt 225.4 lb

## 2023-12-17 DIAGNOSIS — A084 Viral intestinal infection, unspecified: Secondary | ICD-10-CM

## 2023-12-17 MED ORDER — ONDANSETRON 8 MG PO TBDP: 8.0000 mg | ORAL_TABLET | Freq: Three times a day (TID) | ORAL | 0 refills | Status: AC | PRN

## 2023-12-17 NOTE — Progress Notes (Signed)
 Established Patient Office Visit  Subjective   Patient ID: Jamie Allison, female    DOB: 07-14-1975  Age: 48 y.o. MRN: [id number]  Chief Complaint  Patient presents with   Nausea   Emesis   Fatigue    HPI   Jamie Allison is seen with onset on Sunday of nausea, vomiting, and diarrhea.  Her symptoms persisted into Monday.  She is not any vomiting since Monday but still has some nausea but is keeping down fluids.  Very poor appetite but has been able to manage to eat some light things.  Still has some nonbloody diarrhea.  Occasional abdominal cramps.  Increased fatigue.  She tried to go to work earlier today but had difficulty and had to leave early.  Requesting work note.  No known sick contacts.  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 She has never used smokeless tobacco. She reports current alcohol use. She reports that she does not use drugs. family history includes Arthritis in an other family member; Depression in an other family member; Fibroids in her mother; Hypertension in an other family member. No Known Allergies  Review of Systems  Constitutional:  Positive for malaise/fatigue. Negative for chills and fever.  Respiratory:  Negative for cough.   Cardiovascular:  Negative for chest pain.  Gastrointestinal:  Positive for nausea.       See HPI  Genitourinary:  Negative for dysuria.      Objective:     BP (!) 156/70 (BP Location: Left Arm, Patient Position: Sitting, Cuff Size: Normal)   Pulse 72   Temp 98.1 F (36.7 C) (Oral)   Wt 225 lb 6.4 oz (102.2 kg)    SpO2 98%   BMI 38.69 kg/m  BP Readings from Last 3 Encounters:  12/17/23 (!) 156/70  11/12/23 (!) 172/74  10/24/23 (!) 152/89   Wt Readings from Last 3 Encounters:  12/17/23 225 lb 6.4 oz (102.2 kg)  11/12/23 225 lb 3.2 oz (102.2 kg)  10/24/23 219 lb (99.3 kg)      Physical Exam Vitals reviewed.  Constitutional:      General: She is not in acute distress.    Appearance: She is not ill-appearing.  HENT:     Mouth/Throat:     Mouth: Mucous membranes are moist.     Pharynx: Oropharynx is clear.  Cardiovascular:     Rate and Rhythm: Normal rate and regular rhythm.  Pulmonary:     Effort: Pulmonary effort is normal.     Breath sounds: Normal breath sounds.  Abdominal:     General: There is no distension.     Palpations: Abdomen is soft. There is no mass.     Tenderness: There is no abdominal tenderness. There is no guarding or rebound.  Neurological:     Mental Status: She is alert.      No results found for any visits on 12/17/23.    The 10-year ASCVD risk score (Arnett DK, et al., 2019) is: 3.5%  Assessment & Plan:   Viral gastroenteritis.  Her vomiting has resolved but she still has some intermittent queasiness and mild diarrhea but overall improving.  Lingering malaise.  Work note written for today and tomorrow.  Plenty of fluids and consider electrolyte replacement such as Gatorade.  Wrote for Zofran  8 mg ODT 1 every 8 hours as needed.  Follow-up for any persistent or worsening symptoms  Wolm Scarlet, MD

## 2024-02-27 ENCOUNTER — Other Ambulatory Visit: Payer: Self-pay

## 2024-02-27 ENCOUNTER — Emergency Department (HOSPITAL_COMMUNITY)

## 2024-02-27 ENCOUNTER — Emergency Department (HOSPITAL_COMMUNITY)
Admission: EM | Admit: 2024-02-27 | Discharge: 2024-02-27 | Disposition: A | Discharge status: Home / Self Care | Attending: Emergency Medicine | Admitting: Emergency Medicine

## 2024-02-27 ENCOUNTER — Encounter (HOSPITAL_COMMUNITY): Payer: Self-pay | Admitting: Emergency Medicine

## 2024-02-27 DIAGNOSIS — M7989 Other specified soft tissue disorders: Secondary | ICD-10-CM | POA: Diagnosis not present

## 2024-02-27 DIAGNOSIS — I10 Essential (primary) hypertension: Secondary | ICD-10-CM | POA: Insufficient documentation

## 2024-02-27 DIAGNOSIS — M79601 Pain in right arm: Secondary | ICD-10-CM | POA: Diagnosis present

## 2024-02-27 LAB — COMPREHENSIVE METABOLIC PANEL WITH GFR
ALT: 13 U/L (ref 0–44)
AST: 16 U/L (ref 15–41)
Albumin: 3.6 g/dL (ref 3.5–5.0)
Alkaline Phosphatase: 59 U/L (ref 38–126)
Anion gap: 12 (ref 5–15)
BUN: 5 mg/dL — ABNORMAL LOW (ref 6–20)
CO2: 25 mmol/L (ref 22–32)
Calcium: 8.9 mg/dL (ref 8.9–10.3)
Chloride: 103 mmol/L (ref 98–111)
Creatinine, Ser: 0.78 mg/dL (ref 0.44–1.00)
GFR, Estimated: >60 mL/min (ref >60)
Glucose, Bld: 102 mg/dL — ABNORMAL HIGH (ref 70–99)
Potassium: 3 mmol/L — ABNORMAL LOW (ref 3.5–5.1)
Sodium: 140 mmol/L (ref 135–145)
Total Bilirubin: 0.8 mg/dL (ref 0.0–1.2)
Total Protein: 6.6 g/dL (ref 6.5–8.1)

## 2024-02-27 LAB — CBC WITH DIFFERENTIAL/PLATELET
Abs Immature Granulocytes: 0.01 K/uL (ref 0.00–0.07)
Basophils Absolute: 0 K/uL (ref 0.0–0.1)
Basophils Relative: 1 %
Eosinophils Absolute: 0.2 K/uL (ref 0.0–0.5)
Eosinophils Relative: 3 %
HCT: 36.5 % (ref 36.0–46.0)
Hemoglobin: 11.8 g/dL — ABNORMAL LOW (ref 12.0–15.0)
Immature Granulocytes: 0 %
Lymphocytes Relative: 31 %
Lymphs Abs: 2.1 K/uL (ref 0.7–4.0)
MCH: 26.9 pg (ref 26.0–34.0)
MCHC: 32.3 g/dL (ref 30.0–36.0)
MCV: 83.3 fL (ref 80.0–100.0)
Monocytes Absolute: 0.5 K/uL (ref 0.1–1.0)
Monocytes Relative: 7 %
Neutro Abs: 3.8 K/uL (ref 1.7–7.7)
Neutrophils Relative %: 58 %
Platelets: 355 K/uL (ref 150–400)
RBC: 4.38 MIL/uL (ref 3.87–5.11)
RDW: 13.8 % (ref 11.5–15.5)
WBC: 6.6 K/uL (ref 4.0–10.5)
nRBC: 0 % (ref 0.0–0.2)

## 2024-02-27 LAB — PROTIME-INR
INR: 1 (ref 0.8–1.2)
Prothrombin Time: 14.2 s (ref 11.4–15.2)

## 2024-02-27 MED ORDER — KETOROLAC TROMETHAMINE 15 MG/ML IJ SOLN
15.0000 mg | Freq: Once | INTRAMUSCULAR | Status: AC
  Administered 2024-02-27: 15 mg via INTRAVENOUS
  Filled 2024-02-27: qty 1

## 2024-02-27 MED ORDER — POTASSIUM CHLORIDE CRYS ER 20 MEQ PO TBCR
40.0000 meq | EXTENDED_RELEASE_TABLET | Freq: Once | ORAL | Status: AC
  Administered 2024-02-27: 40 meq via ORAL
  Filled 2024-02-27: qty 2

## 2024-02-27 MED ORDER — AMLODIPINE BESYLATE 5 MG PO TABS
5.0000 mg | ORAL_TABLET | Freq: Once | ORAL | Status: AC
  Administered 2024-02-27: 5 mg via ORAL
  Filled 2024-02-27: qty 1

## 2024-02-27 MED ORDER — OXYCODONE HCL 5 MG PO TABS
5.0000 mg | ORAL_TABLET | Freq: Once | ORAL | Status: AC
  Administered 2024-02-27: 5 mg via ORAL
  Filled 2024-02-27: qty 1

## 2024-02-27 NOTE — Progress Notes (Signed)
 VASCULAR LAB    Right upper extremity venous duplex has been performed.  See CV proc for preliminary results.  Relayed results to [facility], PA-C via secure chat  Khylan Sawyer, RVT 02/27/2024, 5:13 PM

## 2024-02-27 NOTE — ED Provider Notes (Signed)
 [place] EMERGENCY DEPARTMENT AT [facility] Provider Note   CSN: [id number] Arrival date & time: 02/27/24  9360     Patient presents with: Arm Pain   Jamie Allison is a 48 y.o. female who presents to the emergency department with a chief complaint of right arm pain.  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  Patient denies trauma or injury to her right arm but states that her arm began to ache throughout the day around the area in front of her elbow.  Patient states that then last night her arm began to ache and hurt very badly even to light touch and with any type of movement.  Patient states that she woke up multiple times throughout the night due to her severe amount of pain.  Denies fever, chills or surrounding redness.  Denies chest pain, shortness of breath.  Denies weakness in her right upper extremity but states that she has severe pain with flexion or extension of the elbow and any movement of the arm.  Patient states that she is prescribed amlodipine  outpatient but has run out and has not picked up her new refill yet.  Denies headache, visual disturbances, or any other symptoms other than right upper extremity pain.  Past medical history significant for depression, sickle cell anemia, headaches, hypertension, etc.  Patient states that she has had sickle cell attacks in the past however they normally start in her legs and that this pain feels much different, states it has been years since she has had a severe attack.  Denies recent surgeries, recent travel, oral birth control medication or hormone replacement, history of blood clot, calf redness/tenderness/swelling.  Denies history of inflammatory arthritis.    Arm Pain       Prior to Admission medications   Medication Sig Start Date End Date Taking? Authorizing Provider  amLODipine  (NORVASC ) 5 MG tablet Take 1 tablet (5 mg total) by mouth daily. 11/12/23   Burchette, Wolm ORN, MD   Biotin 89999 MCG TABS Take by mouth.    [provider]  dicyclomine  (BENTYL ) 20 MG tablet Take 1 tablet (20 mg total) by mouth 2 (two) times daily. 07/29/23   Hildegard Loge, PA-C  famotidine  (PEPCID ) 20 MG tablet Take 1 tablet (20 mg total) by mouth 2 (two) times daily. 08/24/19 12/17/23  Cottie Donnice PARAS, MD  multivitamin-iron-minerals-folic acid (CENTRUM) chewable tablet Chew 1 tablet by mouth daily. 09/04/15   Burchette, Wolm ORN, MD  naproxen  (NAPROSYN ) 500 MG tablet Take 1 tablet (500 mg total) by mouth 2 (two) times daily with a meal. As needed for pain 10/24/23   Ajewole, Christana, MD  norethindrone  (AYGESTIN ) 5 MG tablet Take 2 tablets (10 mg total) by mouth daily. 10/24/23   Ajewole, Christana, MD  ondansetron  (ZOFRAN -ODT) 4 MG disintegrating tablet Take 1 tablet (4 mg total) by mouth every 8 (eight) hours as needed. 07/29/23   Hildegard, Amjad, PA-C  ondansetron  (ZOFRAN -ODT) 8 MG disintegrating tablet Take 1 tablet (8 mg total) by mouth every 8 (eight) hours as needed for nausea or vomiting. 12/17/23   Burchette, Wolm ORN, MD    Allergies: Patient has no known allergies.    Review of Systems  Musculoskeletal:  Positive for myalgias (Right upper extremity pain).    Updated Vital Signs BP (!) 202/106 (BP Location: Right Wrist)   Pulse 62   Temp 98.9 F (37.2 C) (Oral)   Resp 18   Ht 5' 3 (  1.6 m)   Wt 102.1 kg   LMP 02/24/2024 (Exact Date)   SpO2 100%   BMI 39.86 kg/m   Physical Exam Vitals and nursing note reviewed.  Constitutional:      General: She is awake. She is not in acute distress.    Appearance: Normal appearance. She is not ill-appearing, toxic-appearing or diaphoretic.  HENT:     Head: Normocephalic and atraumatic.  Eyes:     General: No scleral icterus. Pulmonary:     Effort: Pulmonary effort is normal. No respiratory distress.  Musculoskeletal:     Comments: Reduced active flexion and extension of the elbow due to pain, passive range of motion is intact of the  right elbow, patient able to flex and extend right wrist move all digits of right hand, passive range of motion of the shoulder is also intact  Right upper extremity neurovascularly intact, radial pulse 2+  Skin:    General: Skin is warm.     Capillary Refill: Capillary refill takes less than 2 seconds.     Comments: Swelling present to medial antecubital fossa of right upper extremity with surrounding venous prominence, no surrounding erythema, area sensitive even to light touch and with any movement of the right upper extremity  Neurological:     General: No focal deficit present.     Mental Status: She is alert and oriented to person, place, and time.  Psychiatric:        Mood and Affect: Mood normal.        Behavior: Behavior normal. Behavior is cooperative.     (all labs ordered are listed, but only abnormal results are displayed) Labs Reviewed  CBC WITH DIFFERENTIAL/PLATELET - Abnormal; Notable for the following components:      Result Value   Hemoglobin 11.8 (*)    All other components within normal limits  COMPREHENSIVE METABOLIC PANEL WITH GFR - Abnormal; Notable for the following components:   Potassium 3.0 (*)    Glucose, Bld 102 (*)    BUN 5 (*)    All other components within normal limits  PROTIME-INR    EKG: None  Radiology: UE Venous Duplex ([facility] and [facility] ONLY) Result Date: 02/27/2024 UPPER VENOUS STUDY  Patient Name:  Jamie Allison  Date of Exam:   02/27/2024 Medical Rec #: [id number]         Accession #:    [id number] Date of Birth: 1975/10/28        Patient Gender: F Patient Age:   39 years Exam Location:  [facility] Procedure:      VAS US  UPPER EXTREMITY VENOUS DUPLEX Referring Phys: Severino Paolo --------------------------------------------------------------------------------  Indications: Pain at medial antecubital fossa, worse with movement since massage a few days ago Comparison Study: No prior study on file Performing Technologist: Alberta Lis  RVS  Examination Guidelines: A complete evaluation includes B-mode imaging, spectral Doppler, color Doppler, and power Doppler as needed of all accessible portions of each vessel. Bilateral testing is considered an integral part of a complete examination. Limited examinations for reoccurring indications may be performed as noted.  Right Findings: +----------+------------+---------+-----------+----------+---------------+ RIGHT     CompressiblePhasicitySpontaneousProperties    Summary     +----------+------------+---------+-----------+----------+---------------+ IJV           Full       Yes       Yes                              +----------+------------+---------+-----------+----------+---------------+  Subclavian               Yes       Yes                              +----------+------------+---------+-----------+----------+---------------+ Axillary                 Yes       Yes                              +----------+------------+---------+-----------+----------+---------------+ Brachial      Full       Yes       Yes                              +----------+------------+---------+-----------+----------+---------------+ Radial        Full                                                  +----------+------------+---------+-----------+----------+---------------+ Ulnar                                               patent by color +----------+------------+---------+-----------+----------+---------------+ Cephalic      Full                                                  +----------+------------+---------+-----------+----------+---------------+ Basilic       Full                                                  +----------+------------+---------+-----------+----------+---------------+  Left Findings: +----------+------------+---------+-----------+----------+-------+ LEFT      CompressiblePhasicitySpontaneousPropertiesSummary  +----------+------------+---------+-----------+----------+-------+ Subclavian               Yes       Yes                      +----------+------------+---------+-----------+----------+-------+  Summary:  Right: No evidence of deep vein thrombosis in the upper extremity. No evidence of superficial vein thrombosis in the upper extremity. Tissue disturbance noted at site of pain at medial antecubital fossa, etiology unknown.  Left: No evidence of thrombosis in the subclavian.  *See table(s) above for measurements and observations.  Diagnosing physician: Gaile New MD Electronically signed by Gaile New MD on 02/27/2024 at 9:56:16 PM.    Final    DG Forearm Right Result Date: 02/27/2024 EXAM: 2 VIEW(S) XRAY OF THE RIGHT FOREARM 02/27/2024 07:30:00 AM COMPARISON: None available. CLINICAL HISTORY: Pain mostly in the right elbow with movement on/off since massage about a week ago. FINDINGS: BONES AND JOINTS: No acute fracture. No focal osseous lesion. No joint dislocation. SOFT TISSUES: The soft tissues are unremarkable. IMPRESSION: 1. No significant abnormality. Electronically signed by: Waddell Calk MD 02/27/2024 07:35 AM EDT RP Workstation: [id number]   DG Humerus  Right Result Date: 02/27/2024 EXAM: 2 VIEW(S) XRAY OF THE RIGHT HUMERUS 02/27/2024 07:30:00 AM COMPARISON: None available. CLINICAL HISTORY: Pain. No injury, pain mostly right elbow with movement on/off since massage about a week ago. FINDINGS: BONES AND JOINTS: No acute fracture. No focal osseous lesion. No joint dislocation. SOFT TISSUES: The soft tissues are unremarkable. IMPRESSION: 1. No significant abnormality. Electronically signed by: Waddell Calk MD 02/27/2024 07:34 AM EDT RP Workstation: [id number]   DG Elbow Complete Right Result Date: 02/27/2024 EXAM: 4 VIEW(S) XRAY OF THE RIGHT ELBOW COMPARISON: Right forearm series 09/16/2006. CLINICAL HISTORY: 48 year old female. Pain, mostly right elbow with movement, on/off since  massage about a week ago. No injury. FINDINGS: BONES AND JOINTS: No fracture. No focal osseous lesion. No joint dislocation. No joint effusion. SOFT TISSUES: No discrete soft tissue injury. IMPRESSION: 1. Negative. Electronically signed by: Helayne Hurst MD 02/27/2024 07:34 AM EDT RP Workstation: [id number]     Procedures   Medications Ordered in the ED  oxyCODONE  (Oxy IR/ROXICODONE ) immediate release tablet 5 mg (5 mg Oral Given 02/27/24 1551)  amLODipine  (NORVASC ) tablet 5 mg (5 mg Oral Given 02/27/24 1552)  ketorolac  (TORADOL ) 15 MG/ML injection 15 mg (15 mg Intravenous Given 02/27/24 1841)  potassium chloride  SA (KLOR-CON  M) CR tablet 40 mEq (40 mEq Oral Given 02/27/24 1840)    Clinical Course as of 02/28/24 0109  Fri Feb 27, 2024  1723 U/S negative [CH]    Clinical Course User Index [CH] Tasmia Blumer, Terrall FALCON, PA-C                                 Medical Decision Making Amount and/or Complexity of Data Reviewed Labs: ordered. Radiology: ordered.  Risk Prescription drug management.   Patient presents to the ED for concern of right arm pain, this involves an extensive number of treatment options, and is a complaint that carries with it a high risk of complications and morbidity.  The differential diagnosis includes inflammatory arthritis, cellulitis, osteoarthritis, DVT, etc.   Co morbidities that complicate the patient evaluation  Hypertension, depression, sickle cell anemia, headaches, etc.   Lab Tests:  I Ordered, and personally interpreted labs.  The pertinent results include: CBC reassuring shows hemoglobin of 11.8, CMP shows potassium of 3, otherwise reassuring, unremarkable PT/INR   Imaging Studies ordered:  I ordered imaging studies including x-rays of the forearm, humerus, elbow on the right, upper extremity venous duplex ultrasound of the right upper extremity I independently visualized and interpreted imaging which showed: X-rays of forearm, humerus, elbow on  the right: Negative for acute osseous abnormality Ultrasound: No evidence of DVT or superficial vein thrombosis, tissue disturbance at level of pain I agree with the radiologist interpretation  Medicines ordered and prescription drug management:  I ordered medication including oxycodone  for pain, Toradol  for pain, potassium for hypokalemia, amlodipine  for hypertension Reevaluation of the patient after these medicines showed that the patient improved I have reviewed the patients home medicines and have made adjustments as needed   Test Considered:  None   Critical Interventions:  None   Problem List / ED Course:  48 year old female, vital signs stable although hypertensive but states that she has not taken her blood pressure medications, presents to the emergency department with a chief complaint of right upper extremity pain since yesterday and swelling, denies trauma/injury On physical exam right medial antecubital fossa significantly swollen with surrounding venous prominence, no risk factors for  DVT however due to presentation suspicious that this may be the cause, we will treat symptomatically with pain medications and give home dose of blood pressure medicine and obtain venous ultrasound to rule out possibility of DVT, low clinical suspicion for cellulitis as venous prominence and swelling is localized, there is no surrounding erythema, patient is afebrile Will also obtain baseline labs due to possibility of blood clot including PT/INR X-rays ordered from triage show no acute osseous abnormality which I would expect as patient denies trauma/injury Ultrasound negative for DVT or superficial venous thrombosis however tissue disturbance noted, patient reassessed and feeling slightly improved after pain medications, blood pressure has also slightly improved, patient denies headache, visual disturbances, or any other symptoms other than right arm pain, low clinical suspicion for  hypertensive emergency at this time based off of history, physical exam, and lab work, I see no indication for a CT head at this time, patient blood pressure has improved with treatment here in the emergency department, patient counseled on picking up her hypertensive medications from the pharmacy and taking as prescribed, patient understanding of her high blood pressure readings today Lab workup overall reassuring, patient noted to have mild hypokalemia will supplement potassium in the emergency department today Unclear etiology of right arm pain however suspicious for 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 throughout the day, based off of swelling and reduced range of motion suspicious for possible ligament/tendon injury/inflammation specifically in the medial epicondyle region, possibly medial epicondylitis, patient educated on her reassuring workup Patient instructed on supportive care outpatient including RICE therapy and anti-inflammatory medication Return precautions given Patient discharged with information for orthopedic follow-up on as-needed basis   Reevaluation:  After the interventions noted above, I reevaluated the patient and found that they have :improved   Social Determinants of Health:  none   Dispostion:  After consideration of the diagnostic results and the patients response to treatment, I feel that the patient would benefit from discharge and outpatient therapy as described, compliance with blood pressure medications.      Final diagnoses:  Right arm pain  Hypertension, unspecified type    ED Discharge Orders     None          Janetta Terrall FALCON, PA-C 02/28/24 0110    Pamella Ozell LABOR, DO 03/03/24 [phone]

## 2024-02-27 NOTE — ED Notes (Signed)
 Pt transported to vascular.

## 2024-02-27 NOTE — ED Triage Notes (Signed)
 Pt c/o right arm pain, worse with movement since yesterday, denies injury

## 2024-02-27 NOTE — Discharge Instructions (Addendum)
 It was a pleasure taking care of you today.  Based on your history, physical exam, labs and imaging it appears you are safe for discharge.  Your x-rays today were negative of your right upper extremity and there was no evidence of blood clot on your ultrasound.  Your potassium was slightly decreased today so I gave you some oral potassium here in the emergency department.  At this time the most likely cause for your elbow pain is some type of inflammatory process, possibly ligament/tendon inflammation from overuse. Please take your prescribed naproxen  500mg  twice daily (morning and night) with food to help with your elbow pain. You may stop this medicine if your elbow pain resolves. Please do not take over-the-counter ibuprofen  with this medication as they are in the same class.  You may also take Tylenol  to help with pain.  Please do not exceed the max daily dose of 4000 mg of Tylenol  or 1000 mg in a single dose.  Also recommend warm compresses, rest, elevation.  Please continue to monitor your symptoms if you experiencing the following symptoms including but not limited to fever, chills, worsening right arm pain or swelling, unexplained weakness, numbness/tingling, or other concerning symptom please return to the emergency department or seek further medical care.  I have also given you the information for an orthopedic follow-up if your symptoms persist or worsen.  If symptoms persist or worsen recommend follow-up within 48 hours either with your PCP, back to the emergency department, or with orthopedics.  Your blood pressure was also elevated here in the emergency department today, please pick up your prescribed medicines from the pharmacy and follow-up with your primary care doctor.

## 2024-03-08 ENCOUNTER — Ambulatory Visit: Payer: Self-pay

## 2024-03-08 NOTE — Telephone Encounter (Signed)
 FYI Only or Action Required?: Action required by provider: request for appointment and referral request.  Patient was last seen in primary care on 12/17/2023 by Micheal Wolm ORN, MD.  Called Nurse Triage reporting No chief complaint on file..  Symptoms began several weeks ago.  Interventions attempted: Nothing.  Symptoms are: unchanged.  Triage Disposition: See Physician Within 24 Hours  Patient/caregiver understands and will follow disposition?: Yes   Copied from CRM #[id number]. Topic: Clinical - Red Word Triage >> Mar 08, 2024 12:02 PM Jamie Allison ORN wrote: Red Word that prompted transfer to Nurse Triage: right arm pain/ache. Pt was seen at ed on 02/27/24. Pt still in pain and needs referral to ortho. Reason for Disposition  Weakness (i.e., loss of strength) of new-onset in hand or fingers  (Exceptions: Not truly weak, hand feels weak because of pain; weakness present > 2 weeks)  Answer Assessment - Initial Assessment Questions 1. ONSET: When did the pain start?     02-27-2024  2. LOCATION: Where is the pain located?     Right Arm  3. PAIN: How bad is the pain? (Scale 0-10; or none, mild, moderate, severe)     Moderate to Severe  4. WORK OR EXERCISE: Has there been any recent work or exercise that involved this part of the body?     No  5. CAUSE: What do you think is causing the arm pain?     Unsure  6. OTHER SYMPTOMS: Do you have any other symptoms? (e.g., neck pain, swelling, rash, fever, numbness, weakness)     Swelling (mild)  7. PREGNANCY: Is there any chance you are pregnant? When was your last menstrual period?     No and No  Protocols used: Arm Pain-A-AH

## 2024-03-09 ENCOUNTER — Encounter: Payer: Self-pay | Admitting: Family Medicine

## 2024-03-09 ENCOUNTER — Ambulatory Visit: Admitting: Family Medicine

## 2024-03-09 VITALS — BP 158/80 | HR 79 | Temp 98.2°F | Wt 225.0 lb

## 2024-03-09 DIAGNOSIS — E876 Hypokalemia: Secondary | ICD-10-CM

## 2024-03-09 DIAGNOSIS — I10 Essential (primary) hypertension: Secondary | ICD-10-CM | POA: Diagnosis not present

## 2024-03-09 DIAGNOSIS — M79601 Pain in right arm: Secondary | ICD-10-CM

## 2024-03-09 MED ORDER — TELMISARTAN 40 MG PO TABS: 40.0000 mg | ORAL_TABLET | Freq: Every day | ORAL | 5 refills | Status: DC

## 2024-03-09 NOTE — Progress Notes (Signed)
 Established Patient Office Visit  Subjective   Patient ID: Jamie Allison, female    DOB: 11-27-1975  Age: 48 y.o. MRN: [id number]  Chief Complaint  Patient presents with   Hospitalization Follow-up   Arm Pain    HPI    Jamie Allison is seen following recent ER visit for arm pain.  She states on October 9 at her work they were doing massages and she recalls her arm being massaged fairly vigorously and adducted across her body.  By the next day she had right arm soreness really more antecubital fossa region and medially.  Denied any specific injury.  She had some progressive soreness and went to the ER on the 10th.  Her blood pressure in the ER was quite elevated at 202/106 but she had not been taking her amlodipine .  This prescription was refilled.  It was felt that she had some mild swelling of the right arm and venous Doppler was ordered and there is no evidence for DVT.  Her labs were significant for potassium 3.0.  Hemoglobin 11.8 which is near her baseline.  She does have a history of sickle trait.  Denies any neck or significant shoulder pain.  She was given Toradol  IV and oxycodone .  Denies recent chest pains.  No diuretic use.  She also had x-rays of the right forearm and humerus which were unremarkable for any acute findings  She takes amlodipine  5 mg daily but have been out until recent ER visit.  She has been on this steadily for the past couple of weeks.  No regular alcohol use.  Denies any recent headaches.  No peripheral edema.  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  She has never used smokeless tobacco. She reports current alcohol use. She reports that she does not use drugs. family history includes Arthritis in an other family member; Depression in an other family member; Fibroids in her mother; Hypertension in an other family member. No Known Allergies  Review of Systems  Constitutional:  Negative for malaise/fatigue.  Eyes:  Negative for blurred vision.  Respiratory:  Negative for shortness of breath.   Cardiovascular:  Negative for chest pain.  Neurological:  Negative for dizziness, weakness and headaches.      Objective:     BP (!) 158/80   Pulse 79   Temp 98.2 F (36.8 C) (Oral)   Wt 225 lb (102.1 kg)   LMP 02/24/2024 (Exact Date)   SpO2 97%   BMI 39.86 kg/m  BP Readings from Last 3 Encounters:  03/09/24 (!) 158/80  02/27/24 (!) 202/106  12/17/23 (!) 156/70   Wt Readings from Last 3 Encounters:  03/09/24 225 lb (102.1 kg)  02/27/24 225 lb (102.1 kg)  12/17/23 225 lb 6.4 oz (102.2 kg)      Physical Exam Vitals reviewed.  Constitutional:      General: She is not in acute distress.    Appearance: She is not ill-appearing.  Cardiovascular:     Rate and Rhythm:  Normal rate and regular rhythm.  Pulmonary:     Effort: Pulmonary effort is normal.     Breath sounds: Normal breath sounds. No wheezing or rales.  Musculoskeletal:     Comments: Right arm examined.  She has somewhat prominent veins right antecubital region.  Possible small lipoma right distal medial arm versus fatty tissue.  No distinct mass.  No lymphadenopathy noted.  Mild medial epicondylar tenderness on the right.  Full range of motion right shoulder.  Skin:    Findings: No rash.  Neurological:     Mental Status: She is alert.      No results found for any visits on 03/09/24.  Last CBC Lab Results  Component Value Date   WBC 6.6 02/27/2024   HGB 11.8 (L) 02/27/2024   HCT 36.5 02/27/2024   MCV 83.3 02/27/2024   MCH 26.9 02/27/2024   RDW 13.8  02/27/2024   PLT 355 02/27/2024   Last metabolic panel Lab Results  Component Value Date   GLUCOSE 102 (H) 02/27/2024   NA 140 02/27/2024   K 3.0 (L) 02/27/2024   CL 103 02/27/2024   CO2 25 02/27/2024   BUN 5 (L) 02/27/2024   CREATININE 0.78 02/27/2024   GFRNONAA >60 02/27/2024   CALCIUM 8.9 02/27/2024   PROT 6.6 02/27/2024   ALBUMIN 3.6 02/27/2024   BILITOT 0.8 02/27/2024   ALKPHOS 59 02/27/2024   AST 16 02/27/2024   ALT 13 02/27/2024   ANIONGAP 12 02/27/2024   Last lipids Lab Results  Component Value Date   CHOL 172 03/09/2019   HDL 41.00 03/09/2019   LDLCALC 117 (H) 03/09/2019   TRIG 70.0 03/09/2019   CHOLHDL 4 03/09/2019   Last hemoglobin A1c Lab Results  Component Value Date   HGBA1C 5.9 (H) 09/03/2023      The 10-year ASCVD risk score (Arnett DK, et al., 2019) is: 3.8%    Assessment & Plan:   #1 right arm pain of uncertain origin.  Recent Doppler revealed no DVT.  May have mild medial epicondylitis.  Onset of pain was day after having vigorous massage of her right arm.  Not clear if this was related or not.  #2 hypertension.  Poorly controlled.  Patient currently back on amlodipine  5 mg daily.  Discussed nonpharmacologic management with sodium reduction and weight control.  Recommend adding Micardis 40 mg daily and set up 1 month follow-up to reassess  #3 hypokalemia by recent labs in the ER with potassium 3.0.  No diuretic use.  No recent vomiting or diarrhea.  Recheck basic metabolic panel today along with magnesium level.  Handout given on potassium rich foods.   Return in about 1 month (around 04/09/2024).    Wolm Scarlet, MD

## 2024-03-09 NOTE — Patient Instructions (Signed)
 Start the new BP medication--- in addition to the Amlodipine .

## 2024-03-10 ENCOUNTER — Ambulatory Visit: Payer: Self-pay | Admitting: Family Medicine

## 2024-03-10 LAB — BASIC METABOLIC PANEL WITH GFR
BUN: 10 mg/dL (ref 6–23)
CO2: 29 meq/L (ref 19–32)
Calcium: 9.2 mg/dL (ref 8.4–10.5)
Chloride: 101 meq/L (ref 96–112)
Creatinine, Ser: 0.89 mg/dL (ref 0.40–1.20)
GFR: 77.49 mL/min (ref >60.00)
Glucose, Bld: 99 mg/dL (ref 70–99)
Potassium: 4 meq/L (ref 3.5–5.1)
Sodium: 139 meq/L (ref 135–145)

## 2024-03-10 LAB — MAGNESIUM: Magnesium: 1.8 mg/dL (ref 1.5–2.5)

## 2024-03-12 ENCOUNTER — Telehealth: Payer: Self-pay | Admitting: Family Medicine

## 2024-03-12 NOTE — Telephone Encounter (Signed)
 Pt dropped off FMLA forms to be completed. Forms in folder

## 2024-04-13 ENCOUNTER — Ambulatory Visit: Admitting: Family Medicine

## 2024-04-13 ENCOUNTER — Encounter: Payer: Self-pay | Admitting: Family Medicine

## 2024-04-13 VITALS — BP 158/80 | HR 85 | Temp 97.7°F | Wt 225.9 lb

## 2024-04-13 DIAGNOSIS — I10 Essential (primary) hypertension: Secondary | ICD-10-CM

## 2024-04-13 DIAGNOSIS — R300 Dysuria: Secondary | ICD-10-CM | POA: Diagnosis not present

## 2024-04-13 DIAGNOSIS — L304 Erythema intertrigo: Secondary | ICD-10-CM

## 2024-04-13 DIAGNOSIS — N898 Other specified noninflammatory disorders of vagina: Secondary | ICD-10-CM

## 2024-04-13 LAB — POC URINALSYSI DIPSTICK (AUTOMATED)
Bilirubin, UA: NEGATIVE
Blood, UA: POSITIVE
Glucose, UA: NEGATIVE
Ketones, UA: NEGATIVE
Leukocytes, UA: NEGATIVE
Nitrite, UA: NEGATIVE
Protein, UA: NEGATIVE
Spec Grav, UA: 1.015 (ref 1.010–1.025)
Urobilinogen, UA: 0.2 U/dL
pH, UA: 6 (ref 5.0–8.0)

## 2024-04-13 MED ORDER — METRONIDAZOLE 500 MG PO TABS: 500.0000 mg | ORAL_TABLET | Freq: Two times a day (BID) | ORAL | 0 refills | Status: AC

## 2024-04-13 MED ORDER — TELMISARTAN-HCTZ 40-12.5 MG PO TABS: 1.0000 | ORAL_TABLET | Freq: Every day | ORAL | 5 refills | Status: AC

## 2024-04-13 MED ORDER — FLUCONAZOLE 100 MG PO TABS: 100.0000 mg | ORAL_TABLET | Freq: Every day | ORAL | 0 refills | Status: AC

## 2024-04-13 NOTE — Progress Notes (Signed)
 Established Patient Office Visit  Subjective   Patient ID: Jamie Allison, female    DOB: 03-Sep-1975  Age: 48 y.o. MRN: [id number]  Chief Complaint  Patient presents with   Dysuria         HPI   Jamie Allison is seen today for several items as follows  She has history of recurrent groin rash.  She tried topical antifungals that never seem to work.  She has had this off and on for years.  Has had great success previously with fluconazole  100 mg daily for 7 days.  She knows that moisture tends to aggravate.  She has some associated pruritus.  Hypertension.  Currently on amlodipine  5 mg daily.  Recently added Micardis  40 mg daily and blood pressure still up today.  No headaches or dizziness.  No alcohol use.  Tries to watch sodium intake.  Recently has noted some vaginal odor.  She wonders if she may have bacterial vaginosis.  She has had occasional dysuria but not consistently.  No recent fevers or chills.  No gross hematuria.  Not currently sexually active.  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 She has never used smokeless tobacco. She reports current alcohol use. She reports that she does not use drugs. family history includes Arthritis in an other family member; Depression in an other family member; Fibroids in her mother; Hypertension in an other family member. No Known Allergies    Review of Systems  Constitutional:  Negative for malaise/fatigue.  Eyes:  Negative for blurred vision.  Respiratory:  Negative for shortness of breath.   Cardiovascular:  Negative for  chest pain.  Genitourinary:  Negative for flank pain and hematuria.  Neurological:  Negative for dizziness, weakness and headaches.      Objective:     BP (!) 158/80 (BP Location: Left Arm, Cuff Size: Large)   Pulse 85   Temp 97.7 F (36.5 C) (Oral)   Wt 225 lb 14.4 oz (102.5 kg)   SpO2 99%   BMI 40.02 kg/m  BP Readings from Last 3 Encounters:  04/13/24 (!) 158/80  03/09/24 (!) 158/80  02/27/24 (!) 202/106   Wt Readings from Last 3 Encounters:  04/13/24 225 lb 14.4 oz (102.5 kg)  03/09/24 225 lb (102.1 kg)  02/27/24 225 lb (102.1 kg)      Physical Exam Vitals reviewed.  Constitutional:      Appearance: She is well-developed.  Eyes:     Pupils: Pupils are equal, round, and reactive to light.  Neck:     Thyroid: No thyromegaly.     Vascular: No JVD.  Cardiovascular:     Rate and Rhythm: Normal rate and regular rhythm.     Heart sounds:     No gallop.  Pulmonary:     Effort: Pulmonary effort is normal. No respiratory distress.     Breath sounds: Normal breath sounds. No wheezing or rales.  Musculoskeletal:     Cervical back: Neck supple.  Neurological:  Mental Status: She is alert.      Results for orders placed or performed in visit on 04/13/24  POCT Urinalysis Dipstick (Automated)  Result Value Ref Range   Color, UA Yellow    Clarity, UA Clear    Glucose, UA Negative Negative   Bilirubin, UA Negative    Ketones, UA Negative    Spec Grav, UA 1.015 1.010 - 1.025   Blood, UA Positive    pH, UA 6.0 5.0 - 8.0   Protein, UA Negative Negative   Urobilinogen, UA 0.2 0.2 or 1.0 E.U./dL   Nitrite, UA Negativr    Leukocytes, UA Negative Negative      The 10-year ASCVD risk score (Arnett DK, et al., 2019) is: 3.8%    Assessment & Plan:   #1 intermittent dysuria.  Urine dipstick today shows no signs of infection.  She did have 1+ blood.  Send urine for microscopic  #2 hypertension suboptimally controlled.  Continue amlodipine  5 mg daily.  Change  Micardis  40 mg to Micardis  HCTZ 40/12.5 mg daily.  Continue low-sodium diet.  Reassess in 3 to 4 weeks.  Check basic metabolic panel at follow-up.  #3 vaginal odor.  Patient concerned about possible bacterial vaginitis.  We agreed to empirically try treatment with metronidazole  500 mg twice daily for 7 days.  Be in touch if symptoms persist  #4 intermittent intertrigo rash.  Has responded well to oral fluconazole  in the past.  Agreed to refill fluconazole  100 mg daily for 7 days.  Follow-up for any persistent rash.   No follow-ups on file.    Jamie Scarlet, MD

## 2024-04-13 NOTE — Patient Instructions (Signed)
 Stop the plain Telmisartan  and start Telmisartan -hydrochlorothiazide one daily  Set up follow-up in 3-4 weeks to reassess.

## 2024-04-14 ENCOUNTER — Ambulatory Visit: Payer: Self-pay | Admitting: Family Medicine

## 2024-04-14 LAB — URINALYSIS, ROUTINE W REFLEX MICROSCOPIC
Bilirubin Urine: NEGATIVE
Ketones, ur: NEGATIVE
Leukocytes,Ua: NEGATIVE
Nitrite: NEGATIVE
Specific Gravity, Urine: 1.01 (ref 1.000–1.030)
Total Protein, Urine: NEGATIVE
Urine Glucose: NEGATIVE
Urobilinogen, UA: 0.2 (ref 0.0–1.0)
pH: 5.5 (ref 5.0–8.0)

## 2024-05-24 ENCOUNTER — Other Ambulatory Visit: Payer: Self-pay

## 2024-05-24 ENCOUNTER — Emergency Department (HOSPITAL_COMMUNITY)
Admission: EM | Admit: 2024-05-24 | Discharge: 2024-05-24 | Disposition: A | Discharge status: Home / Self Care | Attending: Emergency Medicine | Admitting: Emergency Medicine

## 2024-05-24 DIAGNOSIS — B974 Respiratory syncytial virus as the cause of diseases classified elsewhere: Secondary | ICD-10-CM | POA: Insufficient documentation

## 2024-05-24 DIAGNOSIS — Z79899 Other long term (current) drug therapy: Secondary | ICD-10-CM | POA: Insufficient documentation

## 2024-05-24 DIAGNOSIS — R000 Tachycardia, unspecified: Secondary | ICD-10-CM | POA: Diagnosis not present

## 2024-05-24 DIAGNOSIS — I10 Essential (primary) hypertension: Secondary | ICD-10-CM | POA: Insufficient documentation

## 2024-05-24 DIAGNOSIS — J101 Influenza due to other identified influenza virus with other respiratory manifestations: Secondary | ICD-10-CM | POA: Diagnosis not present

## 2024-05-24 DIAGNOSIS — B338 Other specified viral diseases: Secondary | ICD-10-CM

## 2024-05-24 DIAGNOSIS — R059 Cough, unspecified: Secondary | ICD-10-CM | POA: Diagnosis present

## 2024-05-24 LAB — RESP PANEL BY RT-PCR (RSV, FLU A&B, COVID)  RVPGX2
Influenza A by PCR: POSITIVE — AB
Influenza B by PCR: NEGATIVE
Resp Syncytial Virus by PCR: POSITIVE — AB
SARS Coronavirus 2 by RT PCR: NEGATIVE

## 2024-05-24 MED ORDER — ACETAMINOPHEN 500 MG PO TABS
1000.0000 mg | ORAL_TABLET | Freq: Once | ORAL | Status: AC
  Administered 2024-05-24: 1000 mg via ORAL
  Filled 2024-05-24: qty 2

## 2024-05-24 NOTE — Discharge Instructions (Addendum)
 It was a pleasure taking care of you today. You were seen in the Emergency Department for evaluation of headache and cough. Your work-up was reassuring. Your respiratory panel showed you are positive for influenza A as well as RSV.  Both of these illnesses are viruses so antibiotics are not indicated.  You may take up to 1000 mg of Tylenol  every 6 hours.  You may alternate between Tylenol  and ibuprofen  as needed every 3 hours.  It is important to stay hydrated with water and/or Pedialyte or Gatorade.  Obtain plenty of rest.  You are considered no longer contagious once you are fever free for 24 hours.  A fever is considered a temperature of 100.4 degrees or greater.  Refer to the attached documentation for further management of your symptoms. Follow up with your PCP if your symptoms continue.  Please return to the ER if you experience chest pain, trouble breathing, intractable nausea/vomiting or any other life threatening illnesses.

## 2024-05-24 NOTE — ED Provider Notes (Signed)
 " [place] [facility] Provider Note   CSN: [id number] Arrival date & time: 05/24/24  1109     Patient presents with: Headache and Cough   Jamie Allison is a 49 y.o. female with past medical history of hypertension, who presents emergency department for evaluation of headache.  Patient reports that her nephew was recently sick with a bad cold and she thinks she may have gotten sick from him.  She reports headache started yesterday and she states that she has been unable to work due to the pain.  She states she tried taking Tylenol , but it did not help.  Patient also reports congestion and cough that started 2 days ago.  She has not taken any additional medication for the symptoms.  No nausea, vomiting, fever or bodyaches.    Headache Associated symptoms: cough   Cough Associated symptoms: headaches        Prior to Admission medications  Medication Sig Start Date End Date Taking? Authorizing Provider  amLODipine  (NORVASC ) 5 MG tablet Take 1 tablet (5 mg total) by mouth daily. 11/12/23   Burchette, Wolm ORN, MD  Biotin 89999 MCG TABS Take by mouth.    [provider]  dicyclomine  (BENTYL ) 20 MG tablet Take 1 tablet (20 mg total) by mouth 2 (two) times daily. 07/29/23   Hildegard Loge, PA-C  famotidine  (PEPCID ) 20 MG tablet Take 1 tablet (20 mg total) by mouth 2 (two) times daily. 08/24/19 04/13/24  Cottie Donnice PARAS, MD  fluconazole  (DIFLUCAN ) 100 MG tablet Take 1 tablet (100 mg total) by mouth daily. 04/13/24   Burchette, Wolm ORN, MD  multivitamin-iron-minerals-folic acid (CENTRUM) chewable tablet Chew 1 tablet by mouth daily. 09/04/15   Burchette, Wolm ORN, MD  naproxen  (NAPROSYN ) 500 MG tablet Take 1 tablet (500 mg total) by mouth 2 (two) times daily with a meal. As needed for pain 10/24/23   Ajewole, Christana, MD  norethindrone  (AYGESTIN ) 5 MG tablet Take 2 tablets (10 mg total) by mouth daily. 10/24/23   Ajewole, Christana, MD  ondansetron   (ZOFRAN -ODT) 4 MG disintegrating tablet Take 1 tablet (4 mg total) by mouth every 8 (eight) hours as needed. 07/29/23   Hildegard Loge, PA-C  ondansetron  (ZOFRAN -ODT) 8 MG disintegrating tablet Take 1 tablet (8 mg total) by mouth every 8 (eight) hours as needed for nausea or vomiting. 12/17/23   Burchette, Wolm ORN, MD  telmisartan -hydrochlorothiazide (MICARDIS  HCT) 40-12.5 MG tablet Take 1 tablet by mouth daily. 04/13/24   Burchette, Wolm ORN, MD    Allergies: Patient has no known allergies.    Review of Systems  Respiratory:  Positive for cough.   Neurological:  Positive for headaches.    Updated Vital Signs BP (!) 170/110   Pulse (!) 105   Temp 99.8 F (37.7 C)   Resp 18   Ht 5' 4 (1.626 m)   Wt 102.1 kg   LMP 05/24/2024   SpO2 98%   BMI 38.62 kg/m   Physical Exam Vitals and nursing note reviewed.  Constitutional:      Appearance: Normal appearance.  HENT:     Head: Normocephalic and atraumatic.     Mouth/Throat:     Mouth: Mucous membranes are moist.  Eyes:     General: No scleral icterus.       Right eye: No discharge.        Left eye: No discharge.     Conjunctiva/sclera: Conjunctivae normal.  Cardiovascular:     Rate and  Rhythm: Regular rhythm. Tachycardia present.     Pulses: Normal pulses.  Pulmonary:     Effort: Pulmonary effort is normal.     Breath sounds: Normal breath sounds.  Abdominal:     General: There is no distension.     Tenderness: There is no abdominal tenderness.  Musculoskeletal:        General: No deformity.     Cervical back: Normal range of motion.  Skin:    General: Skin is warm and dry.     Capillary Refill: Capillary refill takes less than 2 seconds.  Neurological:     Mental Status: She is alert.     Motor: No weakness.  Psychiatric:        Mood and Affect: Mood normal.     (all labs ordered are listed, but only abnormal results are displayed) Labs Reviewed  RESP PANEL BY RT-PCR (RSV, FLU A&B, COVID)  [id number] - Abnormal; Notable  for the following components:      Result Value   Influenza A by PCR POSITIVE (*)    Resp Syncytial Virus by PCR POSITIVE (*)    All other components within normal limits    EKG: None  Radiology: No results found.   Procedures   Medications Ordered in the ED  acetaminophen  (TYLENOL ) tablet 1,000 mg (1,000 mg Oral Given 05/24/24 1251)                                 Medical Decision Making  This patient presents to the ED for concern of headache and URI symptoms, this involves an extensive number of treatment options, and is a complaint that carries with it a high risk of complications and morbidity.   Differential diagnosis includes: Flu, COVID, RSV, pneumonia, pulmonary embolism, subarachnoid hemorrhage, TIA  Co morbidities:  hypertension   Additional history:  Patient reports her nephew was recently sick and he was staying with her.  Lab Tests:  I Ordered, and personally interpreted labs.  The pertinent results include:    - Respiratory panel: Positive for flu A, RSV  Imaging Studies:  Not indicated  Cardiac Monitoring/ECG:  The patient was maintained on a cardiac monitor.  I personally viewed and interpreted the cardiac monitored which showed an underlying rhythm of: Sinus rhythm  Medicines ordered and prescription drug management:  I ordered medication including  Medications  acetaminophen  (TYLENOL ) tablet 1,000 mg (1,000 mg Oral Given 05/24/24 1251)   for headache Reevaluation of the patient after these medicines showed that the patient improved I have reviewed the patients home medicines and have made adjustments as needed  Test Considered:   none  Critical Interventions:   none  Consultations Obtained: None  Problem List / ED Course:     ICD-10-CM   1. Influenza A  J10.1     2. Infection due to respiratory syncytial virus (RSV), unspecified infection type  B33.8       MDM: 49 year old female who presents emergency department for  evaluation of severe headache.  She also is reporting congestion and cough for the last 2 days.  Her respiratory panel was positive for both influenza A as well as RSV.  Given that both of these diagnoses are viruses, no antibiotics are indicated.  I do suspect that patient's headache is likely secondary to URIs.  Low suspicion for PE, TIA or SAH as symptoms can be explained by positive respiratory panel results.  I  did give the patient 1000 mg of Tylenol  for her headache and advised patient she is able to take up to 1000 mg at 1 time for her headache.  I also informed the patient she can alternate between Tylenol  and ibuprofen  as needed.  Patient verbalized her understanding to this.  I also informed the patient that she should remain at home until she is fever free for 24 hours.  Patient is agreeable to this.  Patient's vital signs are otherwise stable.  Patient is appropriate for discharge at this time.   Dispostion:  After consideration of the diagnostic results and the patients response to treatment, I feel that the patient would benefit from supportive care.   Final diagnoses:  Influenza A  Infection due to respiratory syncytial virus (RSV), unspecified infection type    ED Discharge Orders     None          Torrence Marry RAMAN, PA-C 05/24/24 1301    Freddi Hamilton, MD 05/25/24 [phone]  "

## 2024-05-24 NOTE — ED Triage Notes (Signed)
 Patient c/o headache onset last pm with cough and nausea
